# Patient Record
Sex: Female | Born: 1970 | Race: Black or African American | Hispanic: No | Smoking: Never smoker
Health system: Southern US, Community
[De-identification: ages and names within clinical notes are randomized; demographics above are authoritative.]

## PROBLEM LIST (undated history)

## (undated) DIAGNOSIS — K219 Gastro-esophageal reflux disease without esophagitis: Secondary | ICD-10-CM

## (undated) DIAGNOSIS — D0590 Unspecified type of carcinoma in situ of unspecified breast: Secondary | ICD-10-CM

## (undated) DIAGNOSIS — D571 Sickle-cell disease without crisis: Secondary | ICD-10-CM

## (undated) DIAGNOSIS — Z789 Other specified health status: Secondary | ICD-10-CM

## (undated) DIAGNOSIS — E119 Type 2 diabetes mellitus without complications: Secondary | ICD-10-CM

## (undated) DIAGNOSIS — T7840XA Allergy, unspecified, initial encounter: Secondary | ICD-10-CM

## (undated) DIAGNOSIS — R7303 Prediabetes: Secondary | ICD-10-CM

## (undated) DIAGNOSIS — D649 Anemia, unspecified: Secondary | ICD-10-CM

## (undated) HISTORY — PX: NO PAST SURGERIES: SHX2092

## (undated) HISTORY — DX: Allergy, unspecified, initial encounter: T78.40XA

## (undated) HISTORY — DX: Anemia, unspecified: D64.9

## (undated) HISTORY — DX: Gastro-esophageal reflux disease without esophagitis: K21.9

## (undated) HISTORY — DX: Sickle-cell disease without crisis: D57.1

---

## 2011-07-10 ENCOUNTER — Ambulatory Visit: Payer: Self-pay

## 2012-07-15 ENCOUNTER — Ambulatory Visit: Payer: Self-pay

## 2013-10-05 ENCOUNTER — Ambulatory Visit: Payer: Self-pay

## 2015-11-04 ENCOUNTER — Other Ambulatory Visit: Payer: Self-pay | Admitting: Obstetrics and Gynecology

## 2015-11-04 DIAGNOSIS — Z1231 Encounter for screening mammogram for malignant neoplasm of breast: Secondary | ICD-10-CM

## 2015-11-23 ENCOUNTER — Encounter: Payer: Self-pay | Admitting: Radiology

## 2015-11-23 ENCOUNTER — Ambulatory Visit
Admission: RE | Admit: 2015-11-23 | Discharge: 2015-11-23 | Disposition: A | Discharge status: Home / Self Care | Payer: Commercial Managed Care - PPO | Source: Ambulatory Visit | Attending: Obstetrics and Gynecology | Admitting: Obstetrics and Gynecology

## 2015-11-23 DIAGNOSIS — Z1231 Encounter for screening mammogram for malignant neoplasm of breast: Secondary | ICD-10-CM | POA: Insufficient documentation

## 2016-11-09 ENCOUNTER — Other Ambulatory Visit: Payer: Self-pay | Admitting: Internal Medicine

## 2016-11-09 MED ORDER — PREDNISONE 20 MG PO TABS: 20.0000 mg | ORAL_TABLET | Freq: Every day | ORAL | 0 refills | Status: DC

## 2016-11-09 MED ORDER — AMOXICILLIN-POT CLAVULANATE 875-125 MG PO TABS: 1.0000 | ORAL_TABLET | Freq: Two times a day (BID) | ORAL | 0 refills | Status: DC

## 2017-01-24 DIAGNOSIS — J3089 Other allergic rhinitis: Secondary | ICD-10-CM | POA: Insufficient documentation

## 2017-01-24 DIAGNOSIS — D649 Anemia, unspecified: Secondary | ICD-10-CM | POA: Insufficient documentation

## 2017-11-28 ENCOUNTER — Other Ambulatory Visit: Payer: Self-pay | Admitting: Obstetrics and Gynecology

## 2017-11-28 DIAGNOSIS — Z1231 Encounter for screening mammogram for malignant neoplasm of breast: Secondary | ICD-10-CM

## 2017-12-19 ENCOUNTER — Ambulatory Visit
Admission: RE | Admit: 2017-12-19 | Discharge: 2017-12-19 | Disposition: A | Discharge status: Home / Self Care | Payer: Commercial Managed Care - PPO | Source: Ambulatory Visit | Attending: Obstetrics and Gynecology | Admitting: Obstetrics and Gynecology

## 2017-12-19 DIAGNOSIS — Z1231 Encounter for screening mammogram for malignant neoplasm of breast: Secondary | ICD-10-CM | POA: Diagnosis not present

## 2018-09-24 ENCOUNTER — Other Ambulatory Visit: Payer: Self-pay | Admitting: Internal Medicine

## 2018-09-24 MED ORDER — AMOXICILLIN-POT CLAVULANATE 875-125 MG PO TABS: 1.0000 | ORAL_TABLET | Freq: Two times a day (BID) | ORAL | 0 refills | Status: DC

## 2018-10-27 ENCOUNTER — Other Ambulatory Visit: Payer: Self-pay

## 2018-10-27 ENCOUNTER — Ambulatory Visit: Payer: Commercial Managed Care - PPO | Admitting: Internal Medicine

## 2018-10-27 ENCOUNTER — Encounter: Payer: Self-pay | Admitting: Internal Medicine

## 2018-10-27 VITALS — BP 132/80 | HR 72 | Temp 98.4°F | Ht 61.0 in | Wt 252.0 lb

## 2018-10-27 DIAGNOSIS — R030 Elevated blood-pressure reading, without diagnosis of hypertension: Secondary | ICD-10-CM

## 2018-10-27 DIAGNOSIS — J302 Other seasonal allergic rhinitis: Secondary | ICD-10-CM | POA: Diagnosis not present

## 2018-10-27 NOTE — Progress Notes (Signed)
HPI  Pt presents to the clinic today to establish care and for management of the conditions listed below. She is transferring care from [facility].  Seasonal Allergies: Worse in the spring and fall. She takes Xyzal daily with good relief.  Elevated Blood Pressure: Her BP today is 132/80. She reports she gets anxious when she comes to the doctor. Her BP at home usually runs 120/70. She denies headaches, dizziness or visual changes.  Flu: 12/2017 Tetanus: 08/2017 Pap Smear: 10/2015 Mammogram: 12/2017 Vision Screening: as needed Dentist: as needed  No past medical history on file.  Current Outpatient Medications  Medication Sig Dispense Refill  . levocetirizine (XYZAL) 5 MG tablet every evening.     No current facility-administered medications for this visit.     No Known Allergies  Family History  Problem Relation Age of Onset  . Diabetes Father   . Diabetes Paternal Grandmother   . Breast cancer Neg Hx     Social History   Socioeconomic History  . Marital status: Divorced    Spouse name: Not on file  . Number of children: Not on file  . Years of education: Not on file  . Highest education level: Not on file  Occupational History  . Not on file  Social Needs  . Financial resource strain: Not on file  . Food insecurity    Worry: Not on file    Inability: Not on file  . Transportation needs    Medical: Not on file    Non-medical: Not on file  Tobacco Use  . Smoking status: Never Smoker  . Smokeless tobacco: Never Used  Substance and Sexual Activity  . Alcohol use: Yes    Comment: social  . Drug use: Never  . Sexual activity: Not on file  Lifestyle  . Physical activity    Days per week: Not on file    Minutes per session: Not on file  . Stress: Not on file  Relationships  . Social Herbalist on phone: Not on file    Gets together: Not on file    Attends religious service: Not on file    Active member of club or organization: Not on file   Attends meetings of clubs or organizations: Not on file    Relationship status: Not on file  . Intimate partner violence    Fear of current or ex partner: Not on file    Emotionally abused: Not on file    Physically abused: Not on file    Forced sexual activity: Not on file  Other Topics Concern  . Not on file  Social History Narrative  . Not on file    ROS:  Constitutional: Denies fever, malaise, fatigue, headache or abrupt weight changes.  HEENT: Denies eye pain, eye redness, ear pain, ringing in the ears, wax buildup, runny nose, nasal congestion, bloody nose, or sore throat. Respiratory: Denies difficulty breathing, shortness of breath, cough or sputum production.   Cardiovascular: Denies chest pain, chest tightness, palpitations or swelling in the hands or feet.  Gastrointestinal: Denies abdominal pain, bloating, constipation, diarrhea or blood in the stool.  GU: Denies frequency, urgency, pain with urination, blood in urine, odor or discharge. Musculoskeletal: Denies decrease in range of motion, difficulty with gait, muscle pain or joint pain and swelling.  Skin: Denies redness, rashes, lesions or ulcercations.  Neurological: Denies dizziness, difficulty with memory, difficulty with speech or problems with balance and coordination.  Psych: Denies anxiety, depression, SI/HI.  No other specific complaints in a complete review of systems (except as listed in HPI above).  PE:  BP 132/80   Pulse 72   Temp 98.4 F (36.9 C) (Temporal)   Ht 5\' 1"  (1.549 m)   Wt 252 lb (114.3 kg)   LMP 10/22/2018   SpO2 98%   BMI 47.61 kg/m   Wt Readings from Last 3 Encounters:  10/27/18 252 lb (114.3 kg)    General: Appears her stated age, obese, in NAD. HEENT: Head: normal shape and size; Eyes: sclera white, no icterus, conjunctiva pink, PERRLA and EOMs intact; Ears: Tm's gray and intact, normal light reflex;Throat/Mouth: Teeth present, mucosa pink and moist, no lesions or ulcerations  noted.  Cardiovascular: Normal rate and rhythm. Pulmonary/Chest: Normal effort and positive vesicular breath sounds. No respiratory distress. No wheezes, rales or ronchi noted.  Neurological: Alert and oriented.  Psychiatric: Mood and affect normal. Behavior is normal. Judgment and thought content normal.    Assessment and Plan:  Seasonal Allergies:  Continue Xyzal  Elevated Blood Pressure, Not HTN:  Will monitor Discussed DASH diet and exercise for weight loss  Make an appt for her annual exam

## 2018-10-27 NOTE — Patient Instructions (Signed)

## 2018-10-29 ENCOUNTER — Other Ambulatory Visit: Payer: Self-pay

## 2018-10-29 MED ORDER — LEVOCETIRIZINE DIHYDROCHLORIDE 5 MG PO TABS: 5.0000 mg | ORAL_TABLET | Freq: Every evening | ORAL | 1 refills | Status: DC

## 2018-12-09 ENCOUNTER — Encounter: Payer: Commercial Managed Care - PPO | Admitting: Internal Medicine

## 2019-01-09 ENCOUNTER — Encounter: Payer: Self-pay | Admitting: Internal Medicine

## 2019-01-09 ENCOUNTER — Other Ambulatory Visit: Payer: Self-pay

## 2019-01-09 ENCOUNTER — Ambulatory Visit (INDEPENDENT_AMBULATORY_CARE_PROVIDER_SITE_OTHER): Payer: Commercial Managed Care - PPO | Admitting: Internal Medicine

## 2019-01-09 VITALS — BP 130/84 | HR 71 | Temp 98.2°F | Ht 61.0 in | Wt 255.0 lb

## 2019-01-09 DIAGNOSIS — Z0000 Encounter for general adult medical examination without abnormal findings: Secondary | ICD-10-CM | POA: Diagnosis not present

## 2019-01-09 NOTE — Progress Notes (Deleted)
Established Patient Office Visit  Subjective:  Patient ID: Veronica Chandler, female    DOB: 1970-10-01  Age: 48 y.o. MRN: [id number]    HPI Italia Yui Mulvaney presents for her annual physical. She is due for followup of chronic conditions.   Seasonal Allergies: Worse in the spring and fall. She takes Xyzal daily with good relief. No new issues reported  Elevated Blood Pressure: Her BP today is 130/84. She reports she gets anxious when she comes to the doctor. Her BP at home usually runs 120/70. She denies headaches, dizziness or visual changes.  Flu: 12/2018 Tetanus: 08/2017 Pap Smear: 11/2017 Mammogram: 12/2017 Vision Screening: as needed Dentist: as needed   Diet: Patient eats meats. She consumes fruits and veggies daily. She also eats fried foods. She drinks mostly water, coffee and stays away from carbonated drinks.  Exercise:none  No past medical history on file.  No past surgical history on file.  Family History  Problem Relation Age of Onset  . Diabetes Father   . Diabetes Paternal Grandmother   . Breast cancer Neg Hx     Social History   Socioeconomic History  . Marital status: Divorced    Spouse name: Not on file  . Number of children: Not on file  . Years of education: Not on file  . Highest education level: Not on file  Occupational History  . Not on file  Social Needs  . Financial resource strain: Not on file  . Food insecurity    Worry: Not on file    Inability: Not on file  . Transportation needs    Medical: Not on file    Non-medical: Not on file  Tobacco Use  . Smoking status: Never Smoker  . Smokeless tobacco: Never Used  Substance and Sexual Activity  . Alcohol use: Yes    Comment: social  . Drug use: Never  . Sexual activity: Not on file  Lifestyle  . Physical activity    Days per week: Not on file    Minutes per session: Not on file  . Stress: Not on file  Relationships  . Social Herbalist on phone: Not  on file    Gets together: Not on file    Attends religious service: Not on file    Active member of club or organization: Not on file    Attends meetings of clubs or organizations: Not on file    Relationship status: Not on file  . Intimate partner violence    Fear of current or ex partner: Not on file    Emotionally abused: Not on file    Physically abused: Not on file    Forced sexual activity: Not on file  Other Topics Concern  . Not on file  Social History Narrative  . Not on file    Outpatient Medications Prior to Visit  Medication Sig Dispense Refill  . levocetirizine (XYZAL) 5 MG tablet Take 1 tablet (5 mg total) by mouth every evening. 90 tablet 1   No facility-administered medications prior to visit.     No Known Allergies  ROS Review of Systems   No past medical history on file.  Current Outpatient Medications  Medication Sig Dispense Refill  . levocetirizine (XYZAL) 5 MG tablet Take 1 tablet (5 mg total) by mouth every evening. 90 tablet 1   No current facility-administered medications for this visit.     No Known Allergies  Family History  Problem Relation Age of  Onset  . Diabetes Father   . Diabetes Paternal Grandmother   . Breast cancer Neg Hx     Social History   Socioeconomic History  . Marital status: Divorced    Spouse name: Not on file  . Number of children: Not on file  . Years of education: Not on file  . Highest education level: Not on file  Occupational History  . Not on file  Social Needs  . Financial resource strain: Not on file  . Food insecurity    Worry: Not on file    Inability: Not on file  . Transportation needs    Medical: Not on file    Non-medical: Not on file  Tobacco Use  . Smoking status: Never Smoker  . Smokeless tobacco: Never Used  Substance and Sexual Activity  . Alcohol use: Yes    Comment: social  . Drug use: Never  . Sexual activity: Not on file  Lifestyle  . Physical activity    Days per week: Not  on file    Minutes per session: Not on file  . Stress: Not on file  Relationships  . Social Herbalist on phone: Not on file    Gets together: Not on file    Attends religious service: Not on file    Active member of club or organization: Not on file    Attends meetings of clubs or organizations: Not on file    Relationship status: Not on file  . Intimate partner violence    Fear of current or ex partner: Not on file    Emotionally abused: Not on file    Physically abused: Not on file    Forced sexual activity: Not on file  Other Topics Concern  . Not on file  Social History Narrative  . Not on file     Constitutional: Denies fever, malaise, fatigue, headache or abrupt weight changes.  HEENT: Denies eye pain, eye redness, ear pain, ringing in the ears, wax buildup, runny nose, nasal congestion, bloody nose, or sore throat. Respiratory: Denies difficulty breathing, shortness of breath, cough or sputum production.   Cardiovascular: Denies chest pain, chest tightness, palpitations or swelling in the hands or feet.  Gastrointestinal: Denies abdominal pain, bloating, constipation, diarrhea or blood in the stool.  GU: Denies urgency, frequency, pain with urination, burning sensation, blood in urine, odor or discharge. Musculoskeletal: Denies decrease in range of motion, difficulty with gait, muscle pain or joint pain and swelling.  Skin: Denies redness, rashes, lesions or ulcercations.  Neurological: Denies dizziness, difficulty with memory, difficulty with speech or problems with balance and coordination.  Psych: Denies anxiety, depression, SI/HI.  No other specific complaints in a complete review of systems (except as listed in HPI above).    Objective:    Physical Exam  There were no vitals taken for this visit. Wt Readings from Last 3 Encounters:  10/27/18 114.3 kg     Health Maintenance Due  Topic Date Due  . HIV Screening  11/29/1984  . TETANUS/TDAP   11/29/1988  . INFLUENZA VACCINE  10/04/2018    There are no preventive care reminders to display for this patient.  No results found for: TSH No results found for: WBC, HGB, HCT, MCV, PLT No results found for: NA, K, CHLORIDE, CO2, GLUCOSE, BUN, CREATININE, BILITOT, ALKPHOS, AST, ALT, PROT, ALBUMIN, CALCIUM, ANIONGAP, EGFR, GFR No results found for: CHOL No results found for: HDL No results found for: LDLCALC No results found  for: TRIG No results found for: CHOLHDL No results found for: HGBA1C    No past medical history on file.  Current Outpatient Medications  Medication Sig Dispense Refill  . levocetirizine (XYZAL) 5 MG tablet Take 1 tablet (5 mg total) by mouth every evening. 90 tablet 1   No current facility-administered medications for this visit.     No Known Allergies  Family History  Problem Relation Age of Onset  . Diabetes Father   . Diabetes Paternal Grandmother   . Breast cancer Neg Hx     Social History   Socioeconomic History  . Marital status: Divorced    Spouse name: Not on file  . Number of children: Not on file  . Years of education: Not on file  . Highest education level: Not on file  Occupational History  . Not on file  Social Needs  . Financial resource strain: Not on file  . Food insecurity    Worry: Not on file    Inability: Not on file  . Transportation needs    Medical: Not on file    Non-medical: Not on file  Tobacco Use  . Smoking status: Never Smoker  . Smokeless tobacco: Never Used  Substance and Sexual Activity  . Alcohol use: Yes    Comment: social  . Drug use: Never  . Sexual activity: Not on file  Lifestyle  . Physical activity    Days per week: Not on file    Minutes per session: Not on file  . Stress: Not on file  Relationships  . Social Herbalist on phone: Not on file    Gets together: Not on file    Attends religious service: Not on file    Active member of club or organization: Not on file     Attends meetings of clubs or organizations: Not on file    Relationship status: Not on file  . Intimate partner violence    Fear of current or ex partner: Not on file    Emotionally abused: Not on file    Physically abused: Not on file    Forced sexual activity: Not on file  Other Topics Concern  . Not on file  Social History Narrative  . Not on file     BP 130/84 (BP Location: Right Arm, Patient Position: Sitting, Cuff Size: Large)   Pulse 71   Temp 98.2 F (36.8 C) (Temporal)   Ht '5\' 1"'  (1.549 m)   Wt 115.7 kg   LMP 12/13/2018   SpO2 100%   BMI 48.18 kg/m  Wt Readings from Last 3 Encounters:  01/09/19 115.7 kg  10/27/18 114.3 kg    General: Appears their stated age, well developed, obese Skin: Warm, dry and intact. No rashes, lesions or ulcerations noted. HEENT: Head: normal shape and size; Eyes: sclera white, no icterus, conjunctiva pink and EOMs intact Neck:  Neck supple, trachea midline. No masses, lumps or thyromegaly present.  Cardiovascular: Normal rate and rhythm. S1,S2 noted.  No murmur, rubs or gallops noted. No JVD or BLE edema. No carotid bruits noted. Pulmonary/Chest: Normal effort and positive vesicular breath sounds. No respiratory distress. No wheezes, rales or ronchi noted.  Abdomen: Soft and nontender. Normal bowel sounds. No distention or masses noted. Liver, spleen and kidneys non palpable. Musculoskeletal: Normal range of motion.5+ strength in bilateral upper and lower extremities. No signs of joint swelling. No difficulty with gait.  Neurological: Alert and oriented. Cranial nerves II-XII grossly intact.  Coordination normal.  Psychiatric: Mood and affect normal. Behavior is normal. Judgment and thought content normal.   EKG:  BMET No results found for: NA, K, CL, CO2, GLUCOSE, BUN, CREATININE, CALCIUM, GFRNONAA, GFRAA  Lipid Panel  No results found for: CHOL, TRIG, HDL, CHOLHDL, VLDL, LDLCALC  CBC No results found for: WBC, RBC, HGB, HCT, PLT,  MCV, MCH, MCHC, RDW, LYMPHSABS, MONOABS, EOSABS, BASOSABS  Hgb A1C No results found for: HGBA1C   .    Assessment & Plan:  Encounter for Preventive Healthcare Maintenance:  Seasonal Allergies: Continue Xyzal daily   Elevated Blood Pressure, Not HTN: Will monitor  Flu: UTD Tetanus: UTD Pap Smear: UTD Mammogram: 12/2017 Discussed DASH diet and exercise for weight loss Problem List Items Addressed This Visit    None      No orders of the defined types were placed in this encounter.   Follow-up: No follow-ups on file.    Leward Quan, Student-PA

## 2019-01-09 NOTE — Progress Notes (Signed)
Subjective:    Patient ID: Veronica Chandler, female    DOB: 01/24/1971, 48 y.o.   MRN: [id number]  HPI  Pt presents to the clinic today for her annual exam.  Flu: 12/2018 Tetanus: 08/2017 Pap Smear: 10/2015 Mammogram: 12/2017 Vision Screening: as needed Dentist: as needed  Diet: She does eat meat. She consumes fruits and veggies daily. She tries to avoid fried foods. She drinks mostly water. Exercise: None  Review of Systems      No past medical history on file.  Current Outpatient Medications  Medication Sig Dispense Refill  . levocetirizine (XYZAL) 5 MG tablet Take 1 tablet (5 mg total) by mouth every evening. 90 tablet 1   No current facility-administered medications for this visit.     No Known Allergies  Family History  Problem Relation Age of Onset  . Diabetes Father   . Diabetes Paternal Grandmother   . Breast cancer Neg Hx     Social History   Socioeconomic History  . Marital status: Divorced    Spouse name: Not on file  . Number of children: Not on file  . Years of education: Not on file  . Highest education level: Not on file  Occupational History  . Not on file  Social Needs  . Financial resource strain: Not on file  . Food insecurity    Worry: Not on file    Inability: Not on file  . Transportation needs    Medical: Not on file    Non-medical: Not on file  Tobacco Use  . Smoking status: Never Smoker  . Smokeless tobacco: Never Used  Substance and Sexual Activity  . Alcohol use: Yes    Comment: social  . Drug use: Never  . Sexual activity: Not on file  Lifestyle  . Physical activity    Days per week: Not on file    Minutes per session: Not on file  . Stress: Not on file  Relationships  . Social Herbalist on phone: Not on file    Gets together: Not on file    Attends religious service: Not on file    Active member of club or organization: Not on file    Attends meetings of clubs or organizations: Not on file   Relationship status: Not on file  . Intimate partner violence    Fear of current or ex partner: Not on file    Emotionally abused: Not on file    Physically abused: Not on file    Forced sexual activity: Not on file  Other Topics Concern  . Not on file  Social History Narrative  . Not on file     Constitutional: Denies fever, malaise, fatigue, headache or abrupt weight changes.  HEENT: Denies eye pain, eye redness, ear pain, ringing in the ears, wax buildup, runny nose, nasal congestion, bloody nose, or sore throat. Respiratory: Denies difficulty breathing, shortness of breath, cough or sputum production.   Cardiovascular: Denies chest pain, chest tightness, palpitations or swelling in the hands or feet.  Gastrointestinal: Denies abdominal pain, bloating, constipation, diarrhea or blood in the stool.  GU: Denies urgency, frequency, pain with urination, burning sensation, blood in urine, odor or discharge. Musculoskeletal: Denies decrease in range of motion, difficulty with gait, muscle pain or joint pain and swelling.  Skin: Denies redness, rashes, lesions or ulcercations.  Neurological: Denies dizziness, difficulty with memory, difficulty with speech or problems with balance and coordination.  Psych: Denies anxiety, depression, SI/HI.  No other specific complaints in a complete review of systems (except as listed in HPI above).  Objective:   Physical Exam   BP 130/84 (BP Location: Right Arm, Patient Position: Sitting, Cuff Size: Large)   Pulse 71   Temp 98.2 F (36.8 C) (Temporal)   Ht 5\' 1"  (1.549 m)   Wt 255 lb (115.7 kg)   LMP 12/13/2018   SpO2 100%   BMI 48.18 kg/m   Wt Readings from Last 3 Encounters:  10/27/18 252 lb (114.3 kg)    General: Appears her stated age, obese, in NAD. Skin: Warm, dry and intact. No rashes noted. HEENT: Head: normal shape and size; Eyes: sclera white, no icterus, conjunctiva pink, PERRLA and EOMs intact; Ears: Tm's gray and intact,  normal light reflex;  Neck:  Neck supple, trachea midline. No masses, lumps or thyromegaly present.  Cardiovascular: Normal rate and rhythm. S1,S2 noted.  No murmur, rubs or gallops noted. No JVD or BLE edema.  Pulmonary/Chest: Normal effort and positive vesicular breath sounds. No respiratory distress. No wheezes, rales or ronchi noted.  Abdomen: Soft and nontender. Normal bowel sounds. No distention or masses noted. Liver, spleen and kidneys non palpable. Musculoskeletal: Strength 5/5 BUE/BLE. No difficulty with gait.  Neurological: Alert and oriented. Cranial nerves II-XII grossly intact. Coordination normal.  Psychiatric: Mood and affect normal. Behavior is normal. Judgment and thought content normal.       Assessment & Plan:   Preventative Health Maintenance:  Flu shot UTD Tetanus UTD Pap smear UTD She will call to schedule her mammogram Encouraged her to consume a balanced diet and exercise regimen Advised her to see an eye doctor and dentist annually Will check CBC, CMET, Lipid, A1C and Vit D today  RTC in 1 year, sooner if needed [name], NP

## 2019-01-09 NOTE — Patient Instructions (Signed)
Health Maintenance, Female Adopting a healthy lifestyle and getting preventive care are important in promoting health and wellness. Ask your health care provider about:  The right schedule for you to have regular tests and exams.  Things you can do on your own to prevent diseases and keep yourself healthy. What should I know about diet, weight, and exercise? Eat a healthy diet   Eat a diet that includes plenty of vegetables, fruits, low-fat dairy products, and lean protein.  Do not eat a lot of foods that are high in solid fats, added sugars, or sodium. Maintain a healthy weight Body mass index (BMI) is used to identify weight problems. It estimates body fat based on height and weight. Your health care provider can help determine your BMI and help you achieve or maintain a healthy weight. Get regular exercise Get regular exercise. This is one of the most important things you can do for your health. Most adults should:  Exercise for at least 150 minutes each week. The exercise should increase your heart rate and make you sweat (moderate-intensity exercise).  Do strengthening exercises at least twice a week. This is in addition to the moderate-intensity exercise.  Spend less time sitting. Even light physical activity can be beneficial. Watch cholesterol and blood lipids Have your blood tested for lipids and cholesterol at 49 years of age, then have this test every 5 years. Have your cholesterol levels checked more often if:  Your lipid or cholesterol levels are high.  You are older than 49 years of age.  You are at high risk for heart disease. What should I know about cancer screening? Depending on your health history and family history, you may need to have cancer screening at various ages. This may include screening for:  Breast cancer.  Cervical cancer.  Colorectal cancer.  Skin cancer.  Lung cancer. What should I know about heart disease, diabetes, and high blood  pressure? Blood pressure and heart disease  High blood pressure causes heart disease and increases the risk of stroke. This is more likely to develop in people who have high blood pressure readings, are of African descent, or are overweight.  Have your blood pressure checked: ? Every 3-5 years if you are 18-39 years of age. ? Every year if you are 40 years old or older. Diabetes Have regular diabetes screenings. This checks your fasting blood sugar level. Have the screening done:  Once every three years after age 40 if you are at a normal weight and have a low risk for diabetes.  More often and at a younger age if you are overweight or have a high risk for diabetes. What should I know about preventing infection? Hepatitis B If you have a higher risk for hepatitis B, you should be screened for this virus. Talk with your health care provider to find out if you are at risk for hepatitis B infection. Hepatitis C Testing is recommended for:  Everyone born from 1945 through 1965.  Anyone with known risk factors for hepatitis C. Sexually transmitted infections (STIs)  Get screened for STIs, including gonorrhea and chlamydia, if: ? You are sexually active and are younger than 49 years of age. ? You are older than 49 years of age and your health care provider tells you that you are at risk for this type of infection. ? Your sexual activity has changed since you were last screened, and you are at increased risk for chlamydia or gonorrhea. Ask your health care provider if   you are at risk.  Ask your health care provider about whether you are at high risk for HIV. Your health care provider may recommend a prescription medicine to help prevent HIV infection. If you choose to take medicine to prevent HIV, you should first get tested for HIV. You should then be tested every 3 months for as long as you are taking the medicine. Pregnancy  If you are about to stop having your period (premenopausal) and  you may become pregnant, seek counseling before you get pregnant.  Take 400 to 800 micrograms (mcg) of folic acid every day if you become pregnant.  Ask for birth control (contraception) if you want to prevent pregnancy. Osteoporosis and menopause Osteoporosis is a disease in which the bones lose minerals and strength with aging. This can result in bone fractures. If you are 65 years old or older, or if you are at risk for osteoporosis and fractures, ask your health care provider if you should:  Be screened for bone loss.  Take a calcium or vitamin D supplement to lower your risk of fractures.  Be given hormone replacement therapy (HRT) to treat symptoms of menopause. Follow these instructions at home: Lifestyle  Do not use any products that contain nicotine or tobacco, such as cigarettes, e-cigarettes, and chewing tobacco. If you need help quitting, ask your health care provider.  Do not use street drugs.  Do not share needles.  Ask your health care provider for help if you need support or information about quitting drugs. Alcohol use  Do not drink alcohol if: ? Your health care provider tells you not to drink. ? You are pregnant, may be pregnant, or are planning to become pregnant.  If you drink alcohol: ? Limit how much you use to 0-1 drink a day. ? Limit intake if you are breastfeeding.  Be aware of how much alcohol is in your drink. In the [place] one drink equals one 12 oz bottle of beer (355 mL), one 5 oz glass of wine (148 mL), or one 1 oz glass of hard liquor (44 mL). General instructions  Schedule regular health, dental, and eye exams.  Stay current with your vaccines.  Tell your health care provider if: ? You often feel depressed. ? You have ever been abused or do not feel safe at home. Summary  Adopting a healthy lifestyle and getting preventive care are important in promoting health and wellness.  Follow your health care provider's instructions about healthy  diet, exercising, and getting tested or screened for diseases.  Follow your health care provider's instructions on monitoring your cholesterol and blood pressure. This information is not intended to replace advice given to you by your health care provider. Make sure you discuss any questions you have with your health care provider. Document Released: 09/04/2010 Document Revised: 02/12/2018 Document Reviewed: 02/12/2018 Elsevier Patient Education  2020 Else[employer] Inc.  

## 2019-01-10 LAB — COMPREHENSIVE METABOLIC PANEL
AG Ratio: 1.3 (calc) (ref 1.0–2.5)
ALT: 16 U/L (ref 6–29)
AST: 15 U/L (ref 10–35)
Albumin: 4.2 g/dL (ref 3.6–5.1)
Alkaline phosphatase (APISO): 65 U/L (ref 31–125)
BUN: 15 mg/dL (ref 7–25)
CO2: 26 mmol/L (ref 20–32)
Calcium: 9.8 mg/dL (ref 8.6–10.2)
Chloride: 103 mmol/L (ref 98–110)
Creat: 0.81 mg/dL (ref 0.50–1.10)
Globulin: 3.3 g/dL (calc) (ref 1.9–3.7)
Glucose, Bld: 93 mg/dL (ref 65–99)
Potassium: 4.2 mmol/L (ref 3.5–5.3)
Sodium: 138 mmol/L (ref 135–146)
Total Bilirubin: 0.3 mg/dL (ref 0.2–1.2)
Total Protein: 7.5 g/dL (ref 6.1–8.1)

## 2019-01-10 LAB — CBC
HCT: 35.6 % (ref 35.0–45.0)
Hemoglobin: 11.9 g/dL (ref 11.7–15.5)
MCH: 29.3 pg (ref 27.0–33.0)
MCHC: 33.4 g/dL (ref 32.0–36.0)
MCV: 87.7 fL (ref 80.0–100.0)
MPV: 9.5 fL (ref 7.5–12.5)
Platelets: 299 10*3/uL (ref 140–400)
RBC: 4.06 10*6/uL (ref 3.80–5.10)
RDW: 13.7 % (ref 11.0–15.0)
WBC: 6.3 10*3/uL (ref 3.8–10.8)

## 2019-01-10 LAB — LIPID PANEL
Cholesterol: 151 mg/dL (ref <200)
HDL: 53 mg/dL (ref >=50)
LDL Cholesterol (Calc): 78 mg/dL (calc)
Non-HDL Cholesterol (Calc): 98 mg/dL (calc) (ref <130)
Total CHOL/HDL Ratio: 2.8 (calc) (ref <5.0)
Triglycerides: 115 mg/dL (ref <150)

## 2019-01-10 LAB — HEMOGLOBIN A1C
Hgb A1c MFr Bld: 6.1 % of total Hgb — ABNORMAL HIGH (ref <5.7)
Mean Plasma Glucose: 128 (calc)
eAG (mmol/L): 7.1 (calc)

## 2019-01-10 LAB — VITAMIN D 25 HYDROXY (VIT D DEFICIENCY, FRACTURES): Vit D, 25-Hydroxy: 28 ng/mL — ABNORMAL LOW (ref 30–100)

## 2019-02-05 ENCOUNTER — Ambulatory Visit: Payer: Commercial Managed Care - PPO | Admitting: Internal Medicine

## 2019-02-05 NOTE — Progress Notes (Deleted)
Subjective:    Patient ID: Veronica Chandler, female    DOB: 06-30-1970, 48 y.o.   MRN: [id number]  HPI  Pt presents to the clinic today for her pap smear. Her last pap smear was 08/2015- normal.  Review of Systems      No past medical history on file.  Current Outpatient Medications  Medication Sig Dispense Refill  . levocetirizine (XYZAL) 5 MG tablet Take 1 tablet (5 mg total) by mouth every evening. 90 tablet 1   No current facility-administered medications for this visit.     No Known Allergies  Family History  Problem Relation Age of Onset  . Diabetes Father   . Diabetes Paternal Grandmother   . Breast cancer Neg Hx     Social History   Socioeconomic History  . Marital status: Divorced    Spouse name: Not on file  . Number of children: Not on file  . Years of education: Not on file  . Highest education level: Not on file  Occupational History  . Not on file  Social Needs  . Financial resource strain: Not on file  . Food insecurity    Worry: Not on file    Inability: Not on file  . Transportation needs    Medical: Not on file    Non-medical: Not on file  Tobacco Use  . Smoking status: Never Smoker  . Smokeless tobacco: Never Used  Substance and Sexual Activity  . Alcohol use: Yes    Comment: social  . Drug use: Never  . Sexual activity: Not on file  Lifestyle  . Physical activity    Days per week: Not on file    Minutes per session: Not on file  . Stress: Not on file  Relationships  . Social Herbalist on phone: Not on file    Gets together: Not on file    Attends religious service: Not on file    Active member of club or organization: Not on file    Attends meetings of clubs or organizations: Not on file    Relationship status: Not on file  . Intimate partner violence    Fear of current or ex partner: Not on file    Emotionally abused: Not on file    Physically abused: Not on file    Forced sexual activity: Not on file   Other Topics Concern  . Not on file  Social History Narrative  . Not on file     Constitutional: Denies fever, malaise, fatigue, headache or abrupt weight changes.  HEENT: Denies eye pain, eye redness, ear pain, ringing in the ears, wax buildup, runny nose, nasal congestion, bloody nose, or sore throat. Respiratory: Denies difficulty breathing, shortness of breath, cough or sputum production.   Cardiovascular: Denies chest pain, chest tightness, palpitations or swelling in the hands or feet.  Gastrointestinal: Denies abdominal pain, bloating, constipation, diarrhea or blood in the stool.  GU: Denies urgency, frequency, pain with urination, burning sensation, blood in urine, odor or discharge. Musculoskeletal: Denies decrease in range of motion, difficulty with gait, muscle pain or joint pain and swelling.  Skin: Denies redness, rashes, lesions or ulcercations.  Neurological: Denies dizziness, difficulty with memory, difficulty with speech or problems with balance and coordination.  Psych: Denies anxiety, depression, SI/HI.  No other specific complaints in a complete review of systems (except as listed in HPI above).  Objective:   Physical Exam   There were no vitals taken for this  visit. Wt Readings from Last 3 Encounters:  01/09/19 255 lb (115.7 kg)  10/27/18 252 lb (114.3 kg)    General: Appears their stated age, well developed, well nourished in NAD. Skin: Warm, dry and intact. No rashes, lesions or ulcerations noted. HEENT: Head: normal shape and size; Eyes: sclera white, no icterus, conjunctiva pink, PERRLA and EOMs intact; Ears: Tm's gray and intact, normal light reflex; Nose: mucosa pink and moist, septum midline; Throat/Mouth: Teeth present, mucosa pink and moist, no exudate, lesions or ulcerations noted.  Neck:  Neck supple, trachea midline. No masses, lumps or thyromegaly present.  Cardiovascular: Normal rate and rhythm. S1,S2 noted.  No murmur, rubs or gallops noted. No  JVD or BLE edema. No carotid bruits noted. Pulmonary/Chest: Normal effort and positive vesicular breath sounds. No respiratory distress. No wheezes, rales or ronchi noted.  Abdomen: Soft and nontender. Normal bowel sounds. No distention or masses noted. Liver, spleen and kidneys non palpable. Musculoskeletal: Normal range of motion. No signs of joint swelling. No difficulty with gait.  Neurological: Alert and oriented. Cranial nerves II-XII grossly intact. Coordination normal.  Psychiatric: Mood and affect normal. Behavior is normal. Judgment and thought content normal.   EKG:  BMET    Component Value Date/Time   NA 138 01/09/2019 1505   K 4.2 01/09/2019 1505   CL 103 01/09/2019 1505   CO2 26 01/09/2019 1505   GLUCOSE 93 01/09/2019 1505   BUN 15 01/09/2019 1505   CREATININE 0.81 01/09/2019 1505   CALCIUM 9.8 01/09/2019 1505    Lipid Panel     Component Value Date/Time   CHOL 151 01/09/2019 1505   TRIG 115 01/09/2019 1505   HDL 53 01/09/2019 1505   CHOLHDL 2.8 01/09/2019 1505   LDLCALC 78 01/09/2019 1505    CBC    Component Value Date/Time   WBC 6.3 01/09/2019 1505   RBC 4.06 01/09/2019 1505   HGB 11.9 01/09/2019 1505   HCT 35.6 01/09/2019 1505   PLT 299 01/09/2019 1505   MCV 87.7 01/09/2019 1505   MCH 29.3 01/09/2019 1505   MCHC 33.4 01/09/2019 1505   RDW 13.7 01/09/2019 1505    Hgb A1C Lab Results  Component Value Date   HGBA1C 6.1 (H) 01/09/2019           Assessment & Plan:

## 2019-04-26 ENCOUNTER — Other Ambulatory Visit: Payer: Self-pay | Admitting: Internal Medicine

## 2019-10-25 ENCOUNTER — Other Ambulatory Visit: Payer: Self-pay | Admitting: Internal Medicine

## 2019-12-16 ENCOUNTER — Telehealth: Payer: Self-pay | Admitting: Internal Medicine

## 2019-12-16 NOTE — Telephone Encounter (Signed)
Called and left message for patient to call and reschedule 01/11/20 appt.

## 2020-01-11 ENCOUNTER — Encounter: Payer: Commercial Managed Care - PPO | Admitting: Internal Medicine

## 2020-02-23 ENCOUNTER — Encounter: Payer: Commercial Managed Care - PPO | Admitting: Internal Medicine

## 2020-04-18 ENCOUNTER — Encounter: Payer: Commercial Managed Care - PPO | Admitting: Internal Medicine

## 2020-04-24 ENCOUNTER — Other Ambulatory Visit: Payer: Self-pay | Admitting: Internal Medicine

## 2020-05-05 ENCOUNTER — Other Ambulatory Visit: Payer: Self-pay

## 2020-05-05 ENCOUNTER — Encounter: Payer: Self-pay | Admitting: Internal Medicine

## 2020-05-05 ENCOUNTER — Ambulatory Visit (INDEPENDENT_AMBULATORY_CARE_PROVIDER_SITE_OTHER): Payer: Commercial Managed Care - PPO | Admitting: Internal Medicine

## 2020-05-05 VITALS — BP 134/86 | HR 84 | Temp 98.1°F | Ht 61.0 in | Wt 271.0 lb

## 2020-05-05 DIAGNOSIS — Z1159 Encounter for screening for other viral diseases: Secondary | ICD-10-CM

## 2020-05-05 DIAGNOSIS — Z1211 Encounter for screening for malignant neoplasm of colon: Secondary | ICD-10-CM | POA: Diagnosis not present

## 2020-05-05 DIAGNOSIS — Z1231 Encounter for screening mammogram for malignant neoplasm of breast: Secondary | ICD-10-CM

## 2020-05-05 DIAGNOSIS — Z0000 Encounter for general adult medical examination without abnormal findings: Secondary | ICD-10-CM

## 2020-05-05 DIAGNOSIS — Z114 Encounter for screening for human immunodeficiency virus [HIV]: Secondary | ICD-10-CM | POA: Diagnosis not present

## 2020-05-05 NOTE — Progress Notes (Signed)
Subjective:    Patient ID: Veronica Chandler, female    DOB: 03-10-1970, 50 y.o.   MRN: [id number]  HPI  Pt presents to the clinic today for her annual exam.   Flu: 12/2019 Tetanus: 08/2017 Covid: Moderna x 3 Pap Smear: 11/2017 Mammogram: 12/2017 Colon Screening: never Vision Screening: as needed Dentist: as needed  Diet: She does eat meat. She consumes some fruits and veggies. She does eat some fried foods. She drinks mostly water, soda. Exercise: None  Review of Systems  No past medical history on file.  Current Outpatient Medications  Medication Sig Dispense Refill  . levocetirizine (XYZAL) 5 MG tablet TAKE 1 TABLET BY MOUTH EVERY DAY IN THE EVENING 30 tablet 1   No current facility-administered medications for this visit.    No Known Allergies  Family History  Problem Relation Age of Onset  . Diabetes Father   . Diabetes Paternal Grandmother   . Breast cancer Neg Hx     Social History   Socioeconomic History  . Marital status: Divorced    Spouse name: Not on file  . Number of children: Not on file  . Years of education: Not on file  . Highest education level: Not on file  Occupational History  . Not on file  Tobacco Use  . Smoking status: Never Smoker  . Smokeless tobacco: Never Used  Substance and Sexual Activity  . Alcohol use: Yes    Comment: social  . Drug use: Never  . Sexual activity: Not on file  Other Topics Concern  . Not on file  Social History Narrative  . Not on file   Social Determinants of Health   Financial Resource Strain: Not on file  Food Insecurity: Not on file  Transportation Needs: Not on file  Physical Activity: Not on file  Stress: Not on file  Social Connections: Not on file  Intimate Partner Violence: Not on file     Constitutional: Denies fever, malaise, fatigue, headache or abrupt weight changes.  HEENT: Denies eye pain, eye redness, ear pain, ringing in the ears, wax buildup, runny nose, nasal  congestion, bloody nose, or sore throat. Respiratory: Denies difficulty breathing, shortness of breath, cough or sputum production.   Cardiovascular: Denies chest pain, chest tightness, palpitations or swelling in the hands or feet.  Gastrointestinal: Denies abdominal pain, bloating, constipation, diarrhea or blood in the stool.  GU: Denies urgency, frequency, pain with urination, burning sensation, blood in urine, odor or discharge. Musculoskeletal: Denies decrease in range of motion, difficulty with gait, muscle pain or joint pain and swelling.  Skin: Denies redness, rashes, lesions or ulcercations.  Neurological: Denies dizziness, difficulty with memory, difficulty with speech or problems with balance and coordination.  Psych: Denies anxiety, depression, SI/HI.  No other specific complaints in a complete review of systems (except as listed in HPI above).     Objective:   Physical Exam   BP 134/86   Pulse 84   Temp 98.1 F (36.7 C) (Temporal)   Ht 5\' 1"  (1.549 m)   Wt 271 lb (122.9 kg)   SpO2 98%   BMI 51.21 kg/m   Wt Readings from Last 3 Encounters:  01/09/19 255 lb (115.7 kg)  10/27/18 252 lb (114.3 kg)    General: Appears her stated age, obese, in NAD. Skin: Warm, dry and intact. No rashes noted. HEENT: Head: normal shape and size; Eyes: sclera white, no icterus, conjunctiva pink, PERRLA and EOMs intact; Neck:  Neck supple, trachea midline.  No masses, lumps or thyromegaly present.  Cardiovascular: Normal rate and rhythm. S1,S2 noted.  No murmur, rubs or gallops noted. No JVD or BLE edema. No carotid bruits noted. Pulmonary/Chest: Normal effort and positive vesicular breath sounds. No respiratory distress. No wheezes, rales or ronchi noted.  Abdomen: Soft and nontender. Normal bowel sounds. No distention or masses noted. Liver, spleen and kidneys non palpable. Musculoskeletal: Strength 5/5 BUE/BLE. No difficulty with gait.  Neurological: Alert and oriented. Cranial nerves  II-XII grossly intact. Coordination normal.  Psychiatric: Mood and affect normal. Behavior is normal. Judgment and thought content normal.   BMET    Component Value Date/Time   NA 138 01/09/2019 1505   K 4.2 01/09/2019 1505   CL 103 01/09/2019 1505   CO2 26 01/09/2019 1505   GLUCOSE 93 01/09/2019 1505   BUN 15 01/09/2019 1505   CREATININE 0.81 01/09/2019 1505   CALCIUM 9.8 01/09/2019 1505    Lipid Panel     Component Value Date/Time   CHOL 151 01/09/2019 1505   TRIG 115 01/09/2019 1505   HDL 53 01/09/2019 1505   CHOLHDL 2.8 01/09/2019 1505   LDLCALC 78 01/09/2019 1505    CBC    Component Value Date/Time   WBC 6.3 01/09/2019 1505   RBC 4.06 01/09/2019 1505   HGB 11.9 01/09/2019 1505   HCT 35.6 01/09/2019 1505   PLT 299 01/09/2019 1505   MCV 87.7 01/09/2019 1505   MCH 29.3 01/09/2019 1505   MCHC 33.4 01/09/2019 1505   RDW 13.7 01/09/2019 1505    Hgb A1C Lab Results  Component Value Date   HGBA1C 6.1 (H) 01/09/2019           Assessment & Plan:   Preventative Health Maintenance:  Flu shot UTD Tetanus UTD Covid UTD Pap smear UTD Mammogram ordered, she will call to schedule Referral to GI for screening colonoscopy Encouraged her to consume a balanced diet and exercise regimen Advised her to see an eye doctor and dentist annually Will check CBC, CMET, Lipid, A1C, HIV and Hep C today  RTC in 1 year, sooner if needed   [name], NP This visit occurred during the SARS-CoV-2 public health emergency.  Safety protocols were in place, including screening questions prior to the visit, additional usage of staff PPE, and extensive cleaning of exam room while observing appropriate contact time as indicated for disinfecting solutions.

## 2020-05-05 NOTE — Patient Instructions (Signed)
Health Maintenance, Female Adopting a healthy lifestyle and getting preventive care are important in promoting health and wellness. Ask your health care provider about:  The right schedule for you to have regular tests and exams.  Things you can do on your own to prevent diseases and keep yourself healthy. What should I know about diet, weight, and exercise? Eat a healthy diet  Eat a diet that includes plenty of vegetables, fruits, low-fat dairy products, and lean protein.  Do not eat a lot of foods that are high in solid fats, added sugars, or sodium.   Maintain a healthy weight Body mass index (BMI) is used to identify weight problems. It estimates body fat based on height and weight. Your health care provider can help determine your BMI and help you achieve or maintain a healthy weight. Get regular exercise Get regular exercise. This is one of the most important things you can do for your health. Most adults should:  Exercise for at least 150 minutes each week. The exercise should increase your heart rate and make you sweat (moderate-intensity exercise).  Do strengthening exercises at least twice a week. This is in addition to the moderate-intensity exercise.  Spend less time sitting. Even light physical activity can be beneficial. Watch cholesterol and blood lipids Have your blood tested for lipids and cholesterol at 51 years of age, then have this test every 5 years. Have your cholesterol levels checked more often if:  Your lipid or cholesterol levels are high.  You are older than 51 years of age.  You are at high risk for heart disease. What should I know about cancer screening? Depending on your health history and family history, you may need to have cancer screening at various ages. This may include screening for:  Breast cancer.  Cervical cancer.  Colorectal cancer.  Skin cancer.  Lung cancer. What should I know about heart disease, diabetes, and high blood  pressure? Blood pressure and heart disease  High blood pressure causes heart disease and increases the risk of stroke. This is more likely to develop in people who have high blood pressure readings, are of African descent, or are overweight.  Have your blood pressure checked: ? Every 3-5 years if you are 18-39 years of age. ? Every year if you are 40 years old or older. Diabetes Have regular diabetes screenings. This checks your fasting blood sugar level. Have the screening done:  Once every three years after age 40 if you are at a normal weight and have a low risk for diabetes.  More often and at a younger age if you are overweight or have a high risk for diabetes. What should I know about preventing infection? Hepatitis B If you have a higher risk for hepatitis B, you should be screened for this virus. Talk with your health care provider to find out if you are at risk for hepatitis B infection. Hepatitis C Testing is recommended for:  Everyone born from 1945 through 1965.  Anyone with known risk factors for hepatitis C. Sexually transmitted infections (STIs)  Get screened for STIs, including gonorrhea and chlamydia, if: ? You are sexually active and are younger than 51 years of age. ? You are older than 51 years of age and your health care provider tells you that you are at risk for this type of infection. ? Your sexual activity has changed since you were last screened, and you are at increased risk for chlamydia or gonorrhea. Ask your health care provider   if you are at risk.  Ask your health care provider about whether you are at high risk for HIV. Your health care provider may recommend a prescription medicine to help prevent HIV infection. If you choose to take medicine to prevent HIV, you should first get tested for HIV. You should then be tested every 3 months for as long as you are taking the medicine. Pregnancy  If you are about to stop having your period (premenopausal) and  you may become pregnant, seek counseling before you get pregnant.  Take 400 to 800 micrograms (mcg) of folic acid every day if you become pregnant.  Ask for birth control (contraception) if you want to prevent pregnancy. Osteoporosis and menopause Osteoporosis is a disease in which the bones lose minerals and strength with aging. This can result in bone fractures. If you are 65 years old or older, or if you are at risk for osteoporosis and fractures, ask your health care provider if you should:  Be screened for bone loss.  Take a calcium or vitamin D supplement to lower your risk of fractures.  Be given hormone replacement therapy (HRT) to treat symptoms of menopause. Follow these instructions at home: Lifestyle  Do not use any products that contain nicotine or tobacco, such as cigarettes, e-cigarettes, and chewing tobacco. If you need help quitting, ask your health care provider.  Do not use street drugs.  Do not share needles.  Ask your health care provider for help if you need support or information about quitting drugs. Alcohol use  Do not drink alcohol if: ? Your health care provider tells you not to drink. ? You are pregnant, may be pregnant, or are planning to become pregnant.  If you drink alcohol: ? Limit how much you use to 0-1 drink a day. ? Limit intake if you are breastfeeding.  Be aware of how much alcohol is in your drink. In the [place] one drink equals one 12 oz bottle of beer (355 mL), one 5 oz glass of wine (148 mL), or one 1 oz glass of hard liquor (44 mL). General instructions  Schedule regular health, dental, and eye exams.  Stay current with your vaccines.  Tell your health care provider if: ? You often feel depressed. ? You have ever been abused or do not feel safe at home. Summary  Adopting a healthy lifestyle and getting preventive care are important in promoting health and wellness.  Follow your health care provider's instructions about healthy  diet, exercising, and getting tested or screened for diseases.  Follow your health care provider's instructions on monitoring your cholesterol and blood pressure. This information is not intended to replace advice given to you by your health care provider. Make sure you discuss any questions you have with your health care provider. Document Revised: 02/12/2018 Document Reviewed: 02/12/2018 Elsevier Patient Education  2021 [employer]  

## 2020-05-06 LAB — COMPREHENSIVE METABOLIC PANEL
ALT: 15 U/L (ref 0–35)
AST: 18 U/L (ref 0–37)
Albumin: 4.1 g/dL (ref 3.5–5.2)
Alkaline Phosphatase: 68 U/L (ref 39–117)
BUN: 11 mg/dL (ref 6–23)
CO2: 30 mEq/L (ref 19–32)
Calcium: 10.2 mg/dL (ref 8.4–10.5)
Chloride: 101 mEq/L (ref 96–112)
Creatinine, Ser: 0.81 mg/dL (ref 0.40–1.20)
GFR: 84.63 mL/min (ref >60.00)
Glucose, Bld: 82 mg/dL (ref 70–99)
Potassium: 3.7 mEq/L (ref 3.5–5.1)
Sodium: 139 mEq/L (ref 135–145)
Total Bilirubin: 0.3 mg/dL (ref 0.2–1.2)
Total Protein: 7.7 g/dL (ref 6.0–8.3)

## 2020-05-06 LAB — LIPID PANEL
Cholesterol: 160 mg/dL (ref 0–200)
HDL: 59.6 mg/dL (ref >39.00)
LDL Cholesterol: 85 mg/dL (ref 0–99)
NonHDL: 100.25
Total CHOL/HDL Ratio: 3
Triglycerides: 75 mg/dL (ref 0.0–149.0)
VLDL: 15 mg/dL (ref 0.0–40.0)

## 2020-05-06 LAB — CBC
HCT: 36.2 % (ref 36.0–46.0)
Hemoglobin: 12.1 g/dL (ref 12.0–15.0)
MCHC: 33.5 g/dL (ref 30.0–36.0)
MCV: 86.4 fl (ref 78.0–100.0)
Platelets: 351 10*3/uL (ref 150.0–400.0)
RBC: 4.19 Mil/uL (ref 3.87–5.11)
RDW: 14.5 % (ref 11.5–15.5)
WBC: 5.7 10*3/uL (ref 4.0–10.5)

## 2020-05-06 LAB — HIV ANTIBODY (ROUTINE TESTING W REFLEX): HIV 1&2 Ab, 4th Generation: NONREACTIVE

## 2020-05-06 LAB — HEMOGLOBIN A1C: Hgb A1c MFr Bld: 6.9 % — ABNORMAL HIGH (ref 4.6–6.5)

## 2020-05-06 LAB — HEPATITIS C ANTIBODY
Hepatitis C Ab: NONREACTIVE
SIGNAL TO CUT-OFF: 0.01 (ref <1.00)

## 2020-05-12 ENCOUNTER — Other Ambulatory Visit: Payer: Self-pay

## 2020-05-12 ENCOUNTER — Telehealth (INDEPENDENT_AMBULATORY_CARE_PROVIDER_SITE_OTHER): Payer: Self-pay | Admitting: Gastroenterology

## 2020-05-12 DIAGNOSIS — E669 Obesity, unspecified: Secondary | ICD-10-CM | POA: Insufficient documentation

## 2020-05-12 DIAGNOSIS — Z1211 Encounter for screening for malignant neoplasm of colon: Secondary | ICD-10-CM

## 2020-05-12 DIAGNOSIS — E668 Other obesity: Secondary | ICD-10-CM | POA: Insufficient documentation

## 2020-05-12 MED ORDER — NA SULFATE-K SULFATE-MG SULF 17.5-3.13-1.6 GM/177ML PO SOLN: 1.0000 | Freq: Once | ORAL | 0 refills | Status: AC

## 2020-05-12 NOTE — Progress Notes (Signed)
Gastroenterology Pre-Procedure Review  Request Date: 06/16/20 Requesting Physician: Dr. Vicente Males  PATIENT REVIEW QUESTIONS: The patient responded to the following health history questions as indicated:    1. Are you having any GI issues? no 2. Do you have a personal history of Polyps? no 3. Do you have a family history of Colon Cancer or Polyps? no 4. Diabetes Mellitus? no 5. Joint replacements in the past 12 months?no 6. Major health problems in the past 3 months?no 7. Any artificial heart valves, MVP, or defibrillator?no    MEDICATIONS & ALLERGIES:    Patient reports the following regarding taking any anticoagulation/antiplatelet therapy:   Plavix, Coumadin, Eliquis, Xarelto, Lovenox, Pradaxa, Brilinta, or Effient? no Aspirin? no  Patient confirms/reports the following medications:  Current Outpatient Medications  Medication Sig Dispense Refill  . levocetirizine (XYZAL) 5 MG tablet TAKE 1 TABLET BY MOUTH EVERY DAY IN THE EVENING 30 tablet 1  . Multiple Vitamin (MULTIVITAMIN) LIQD Take 5 mLs by mouth daily.    . Na Sulfate-K Sulfate-Mg Sulf 17.5-3.13-1.6 GM/177ML SOLN Take 1 kit by mouth once for 1 dose. 354 mL 0   No current facility-administered medications for this visit.    Patient confirms/reports the following allergies:  No Known Allergies  Orders Placed This Encounter  Procedures  . Procedural/ Surgical Case Request: COLONOSCOPY WITH PROPOFOL    Standing Status:   Standing    Number of Occurrences:   1    Order Specific Question:   Pre-op diagnosis    Answer:   SCREENING COLONOSCOPY    Order Specific Question:   CPT Code    Answer:   76701    AUTHORIZATION INFORMATION Primary Insurance: 1D#: Group #:  Secondary Insurance: 1D#: Group #:  SCHEDULE INFORMATION: Date: Thursday 06/16/20 Time: Location:[facility]

## 2020-05-19 ENCOUNTER — Other Ambulatory Visit: Payer: Self-pay

## 2020-05-19 ENCOUNTER — Ambulatory Visit: Payer: Commercial Managed Care - PPO | Admitting: Internal Medicine

## 2020-05-19 ENCOUNTER — Encounter: Payer: Self-pay | Admitting: Internal Medicine

## 2020-05-19 VITALS — BP 134/88 | HR 70 | Temp 98.1°F | Wt 275.0 lb

## 2020-05-19 DIAGNOSIS — E1165 Type 2 diabetes mellitus with hyperglycemia: Secondary | ICD-10-CM | POA: Diagnosis not present

## 2020-05-19 DIAGNOSIS — Z23 Encounter for immunization: Secondary | ICD-10-CM | POA: Diagnosis not present

## 2020-05-19 DIAGNOSIS — Z6841 Body Mass Index (BMI) 40.0 and over, adult: Secondary | ICD-10-CM

## 2020-05-19 NOTE — Progress Notes (Signed)
Subjective:    Patient ID: Veronica Chandler, female    DOB: 02-Nov-1970, 50 y.o.   MRN: [id number]  HPI  Patient presents to the clinic today for follow-up labs.  She had a recent A1c of 6.9% up from 6.1%, 1 year ago.  She has never been told she is diabetic but it does run in her family.  She denies visual changes, increased thirst, urinary frequency, numbness or tingling of her upper or lower extremities or nonhealing wounds.  Her BP today is 134/88.  Her LDL was 85.  Review of Systems  No past medical history on file.  Current Outpatient Medications  Medication Sig Dispense Refill  . levocetirizine (XYZAL) 5 MG tablet TAKE 1 TABLET BY MOUTH EVERY DAY IN THE EVENING 30 tablet 1  . Multiple Vitamin (MULTIVITAMIN) LIQD Take 5 mLs by mouth daily.     No current facility-administered medications for this visit.    No Known Allergies  Family History  Problem Relation Age of Onset  . Diabetes Father   . Diabetes Paternal Grandmother   . Breast cancer Neg Hx     Social History   Socioeconomic History  . Marital status: Divorced    Spouse name: Not on file  . Number of children: Not on file  . Years of education: Not on file  . Highest education level: Not on file  Occupational History  . Not on file  Tobacco Use  . Smoking status: Never Smoker  . Smokeless tobacco: Never Used  Substance and Sexual Activity  . Alcohol use: Yes    Comment: social  . Drug use: Never  . Sexual activity: Not on file  Other Topics Concern  . Not on file  Social History Narrative  . Not on file   Social Determinants of Health   Financial Resource Strain: Not on file  Food Insecurity: Not on file  Transportation Needs: Not on file  Physical Activity: Not on file  Stress: Not on file  Social Connections: Not on file  Intimate Partner Violence: Not on file     Constitutional: Denies fever, malaise, fatigue, headache or abrupt weight changes.  HEENT: Denies eye pain, eye  redness, ear pain, ringing in the ears, wax buildup, runny nose, nasal congestion, bloody nose, or sore throat. Respiratory: Denies difficulty breathing, shortness of breath, cough or sputum production.   Cardiovascular: Denies chest pain, chest tightness, palpitations or swelling in the hands or feet.  Gastrointestinal: Denies abdominal pain, bloating, constipation, diarrhea or blood in the stool.  GU: Denies urgency, frequency, pain with urination, burning sensation, blood in urine, odor or discharge. Skin: Denies redness, rashes, lesions or ulcercations.  Neurological: Denies dizziness, difficulty with memory, difficulty with speech or problems with balance and coordination.   No other specific complaints in a complete review of systems (except as listed in HPI above).     Objective:   Physical Exam   BP 134/88   Pulse 70   Temp 98.1 F (36.7 C) (Temporal)   Wt 124.7 kg   SpO2 98%   BMI 51.96 kg/m  Wt Readings from Last 3 Encounters:  05/19/20 124.7 kg  05/05/20 122.9 kg  01/09/19 115.7 kg    General: Appears her stated age, obese, in NAD. Skin: Warm, dry and intact. No ulcerations noted. HEENT: Head: normal shape and size; Eyes: sclera white, no icterus, conjunctiva pink, PERRLA and EOMs intact; Cardiovascular: Normal rate and rhythm. S1,S2 noted.  No murmur, rubs or gallops noted.  Pulmonary/Chest: Normal effort and positive vesicular breath sounds. No respiratory distress. No wheezes, rales or ronchi noted.  Neurological: Alert and oriented.  BMET    Component Value Date/Time   NA 139 05/05/2020 1418   K 3.7 05/05/2020 1418   CL 101 05/05/2020 1418   CO2 30 05/05/2020 1418   GLUCOSE 82 05/05/2020 1418   BUN 11 05/05/2020 1418   CREATININE 0.81 05/05/2020 1418   CREATININE 0.81 01/09/2019 1505   CALCIUM 10.2 05/05/2020 1418    Lipid Panel     Component Value Date/Time   CHOL 160 05/05/2020 1418   TRIG 75.0 05/05/2020 1418   HDL 59.60 05/05/2020 1418    CHOLHDL 3 05/05/2020 1418   VLDL 15.0 05/05/2020 1418   LDLCALC 85 05/05/2020 1418   LDLCALC 78 01/09/2019 1505    CBC    Component Value Date/Time   WBC 5.7 05/05/2020 1418   RBC 4.19 05/05/2020 1418   HGB 12.1 05/05/2020 1418   HCT 36.2 05/05/2020 1418   PLT 351.0 05/05/2020 1418   MCV 86.4 05/05/2020 1418   MCH 29.3 01/09/2019 1505   MCHC 33.5 05/05/2020 1418   RDW 14.5 05/05/2020 1418    Hgb A1C Lab Results  Component Value Date   HGBA1C 6.9 (H) 05/05/2020           Assessment & Plan:

## 2020-05-20 ENCOUNTER — Encounter: Payer: Self-pay | Admitting: Internal Medicine

## 2020-05-20 DIAGNOSIS — E119 Type 2 diabetes mellitus without complications: Secondary | ICD-10-CM | POA: Insufficient documentation

## 2020-05-20 NOTE — Patient Instructions (Signed)

## 2020-05-20 NOTE — Assessment & Plan Note (Signed)
New onset Discussed diabetes and standards of medical care Discussed a trial of 3 months of lifestyle changes including low-carb, low saturated fat diet and exercise for weight loss We will hold off on medication therapy at this time Encouraged dilated eye exam yearly Encourage routine foot exams She declines referral to diabetes education and nutrition at this time Flu shot UTD COVID vaccines UTD Pneumovax today

## 2020-05-22 ENCOUNTER — Encounter: Payer: Self-pay | Admitting: Internal Medicine

## 2020-05-24 NOTE — Addendum Note (Signed)
Addended by: Lurlean Nanny on: 05/24/2020 05:16 PM   Modules accepted: Orders

## 2020-05-27 ENCOUNTER — Other Ambulatory Visit: Payer: Self-pay

## 2020-05-27 ENCOUNTER — Ambulatory Visit
Admission: RE | Admit: 2020-05-27 | Discharge: 2020-05-27 | Disposition: A | Discharge status: Home / Self Care | Payer: Commercial Managed Care - PPO | Source: Ambulatory Visit | Attending: Internal Medicine | Admitting: Internal Medicine

## 2020-05-27 DIAGNOSIS — Z1231 Encounter for screening mammogram for malignant neoplasm of breast: Secondary | ICD-10-CM | POA: Diagnosis present

## 2020-06-09 ENCOUNTER — Encounter: Payer: Commercial Managed Care - PPO | Admitting: Internal Medicine

## 2020-06-14 ENCOUNTER — Other Ambulatory Visit: Payer: Commercial Managed Care - PPO

## 2020-06-16 ENCOUNTER — Encounter: Payer: Self-pay | Admitting: Gastroenterology

## 2020-06-16 ENCOUNTER — Ambulatory Visit: Payer: Commercial Managed Care - PPO | Admitting: Anesthesiology

## 2020-06-16 ENCOUNTER — Encounter
Admission: RE | Disposition: A | Discharge status: Home / Self Care | Payer: Self-pay | Source: Home / Self Care | Attending: Gastroenterology

## 2020-06-16 ENCOUNTER — Ambulatory Visit
Admission: RE | Admit: 2020-06-16 | Discharge: 2020-06-16 | Disposition: A | Discharge status: Home / Self Care | Payer: Commercial Managed Care - PPO | Attending: Gastroenterology | Admitting: Gastroenterology

## 2020-06-16 ENCOUNTER — Other Ambulatory Visit: Payer: Self-pay

## 2020-06-16 DIAGNOSIS — Z1211 Encounter for screening for malignant neoplasm of colon: Secondary | ICD-10-CM | POA: Diagnosis not present

## 2020-06-16 HISTORY — PX: COLONOSCOPY WITH PROPOFOL: SHX5780

## 2020-06-16 HISTORY — DX: Other specified health status: Z78.9

## 2020-06-16 LAB — POCT PREGNANCY, URINE: Preg Test, Ur: NEGATIVE

## 2020-06-16 SURGERY — COLONOSCOPY WITH PROPOFOL
Anesthesia: General

## 2020-06-16 MED ORDER — SODIUM CHLORIDE 0.9 % IV SOLN
INTRAVENOUS | Status: DC
Start: 2020-06-16 — End: 2020-06-16

## 2020-06-16 MED ORDER — PROPOFOL 500 MG/50ML IV EMUL
INTRAVENOUS | Status: AC
  Filled 2020-06-16: qty 50

## 2020-06-16 MED ORDER — PROPOFOL 500 MG/50ML IV EMUL
INTRAVENOUS | Status: DC | PRN
  Administered 2020-06-16: 120 ug/kg/min via INTRAVENOUS

## 2020-06-16 NOTE — Transfer of Care (Signed)
Immediate Anesthesia Transfer of Care Note  Patient: Kaslyn Richburg  Procedure(s) Performed: COLONOSCOPY WITH PROPOFOL (N/A )  Patient Location: PACU  Anesthesia Type:General  Level of Consciousness: awake and sedated  Airway & Oxygen Therapy: Patient Spontanous Breathing and Patient connected to face mask oxygen  Post-op Assessment: Report given to RN and Post -op Vital signs reviewed and stable  Post vital signs: Reviewed and stable  Last Vitals:  Vitals Value Taken Time  BP 117/63 06/16/20 0800  Temp    Pulse 77 06/16/20 0759  Resp 26 06/16/20 0759  SpO2 98 % 06/16/20 0759  Vitals shown include unvalidated device data.  Last Pain:  Vitals:   06/16/20 0701  TempSrc: Temporal  PainSc: 0-No pain         Complications: No complications documented.

## 2020-06-16 NOTE — Anesthesia Procedure Notes (Signed)
Performed by: Cook-Martin, Rowyn Spilde °Pre-anesthesia Checklist: Patient identified, Emergency Drugs available, Suction available, Patient being monitored and Timeout performed °Patient Re-evaluated:Patient Re-evaluated prior to induction °Oxygen Delivery Method: Simple face mask °Preoxygenation: Pre-oxygenation with 100% oxygen °Induction Type: IV induction °Placement Confirmation: positive ETCO2 and CO2 detector ° ° ° ° ° ° °

## 2020-06-16 NOTE — Anesthesia Preprocedure Evaluation (Signed)
Anesthesia Evaluation  Patient identified by MRN, date of birth, ID band Patient awake    Reviewed: Allergy & Precautions, NPO status , Patient's Chart, lab work & pertinent test results  Airway Mallampati: III       Dental   Pulmonary neg pulmonary ROS,    Pulmonary exam normal        Cardiovascular negative cardio ROS Normal cardiovascular exam     Neuro/Psych negative neurological ROS     GI/Hepatic Neg liver ROS,   Endo/Other  diabetesMorbid obesity  Renal/GU negative Renal ROS  negative genitourinary   Musculoskeletal negative musculoskeletal ROS (+)   Abdominal (+) + obese,   Peds negative pediatric ROS (+)  Hematology negative hematology ROS (+)   Anesthesia Other Findings Past Medical History: No date: Medical history non-contributory  Reproductive/Obstetrics                             Anesthesia Physical Anesthesia Plan  ASA: III  Anesthesia Plan: General   Post-op Pain Management:    Induction: Intravenous  PONV Risk Score and Plan:   Airway Management Planned: Nasal Cannula  Additional Equipment:   Intra-op Plan:   Post-operative Plan:   Informed Consent: I have reviewed the patients History and Physical, chart, labs and discussed the procedure including the risks, benefits and alternatives for the proposed anesthesia with the patient or authorized representative who has indicated his/her understanding and acceptance.     Dental advisory given  Plan Discussed with: CRNA and Surgeon  Anesthesia Plan Comments:         Anesthesia Quick Evaluation

## 2020-06-16 NOTE — Anesthesia Postprocedure Evaluation (Signed)
Anesthesia Post Note  Patient: Veronica Chandler  Procedure(s) Performed: COLONOSCOPY WITH PROPOFOL (N/A )  Patient location during evaluation: Endoscopy Anesthesia Type: General Level of consciousness: awake and alert and oriented Pain management: pain level controlled Vital Signs Assessment: post-procedure vital signs reviewed and stable Respiratory status: spontaneous breathing Cardiovascular status: blood pressure returned to baseline Anesthetic complications: no   No complications documented.   Last Vitals:  Vitals:   06/16/20 0810 06/16/20 0820  BP: 126/86 (!) 111/52  Pulse:    Resp:    Temp:    SpO2:      Last Pain:  Vitals:   06/16/20 0830  TempSrc:   PainSc: 0-No pain                 Nataleah Scioneaux

## 2020-06-16 NOTE — H&P (Signed)
     Jonathon Bellows, MD [address], [place], [place], [place], [address] [address], [place], [place], [place], [address] Phone: [phone]  Fax: [phone]  Primary Care Physician:  Jearld Fenton, NP   Pre-Procedure History & Physical: HPI:  Veronica Chandler is a 50 y.o. female is here for an colonoscopy.   Past Medical History:  Diagnosis Date  . Medical history non-contributory     Past Surgical History:  Procedure Laterality Date  . NO PAST SURGERIES      Prior to Admission medications   Medication Sig Start Date End Date Taking? Authorizing Provider  levocetirizine (XYZAL) 5 MG tablet TAKE 1 TABLET BY MOUTH EVERY DAY IN THE EVENING 04/25/20  Yes Baity, Coralie Keens, NP  Multiple Vitamin (MULTIVITAMIN) LIQD Take 5 mLs by mouth daily.   Yes [provider]    Allergies as of 05/12/2020  . (No Known Allergies)    Family History  Problem Relation Age of Onset  . Diabetes Father   . Diabetes Paternal Grandmother   . Breast cancer Neg Hx     Social History   Socioeconomic History  . Marital status: Divorced    Spouse name: Not on file  . Number of children: Not on file  . Years of education: Not on file  . Highest education level: Not on file  Occupational History  . Not on file  Tobacco Use  . Smoking status: Never Smoker  . Smokeless tobacco: Never Used  Vaping Use  . Vaping Use: Never used  Substance and Sexual Activity  . Alcohol use: Yes    Comment: social  . Drug use: Never  . Sexual activity: Not on file  Other Topics Concern  . Not on file  Social History Narrative  . Not on file   Social Determinants of Health   Financial Resource Strain: Not on file  Food Insecurity: Not on file  Transportation Needs: Not on file  Physical Activity: Not on file  Stress: Not on file  Social Connections: Not on file  Intimate Partner Violence: Not on file    Review of Systems: See HPI, otherwise negative ROS  Physical  Exam: BP (!) 163/88   Pulse 85   Temp (!) 97.3 F (36.3 C) (Temporal)   Resp 18   Ht 5' (1.524 m)   Wt 119.7 kg   SpO2 100%   BMI 51.56 kg/m  General:   Alert,  pleasant and cooperative in NAD Head:  Normocephalic and atraumatic. Neck:  Supple; no masses or thyromegaly. Lungs:  Clear throughout to auscultation, normal respiratory effort.    Heart:  +S1, +S2, Regular rate and rhythm, No edema. Abdomen:  Soft, nontender and nondistended. Normal bowel sounds, without guarding, and without rebound.   Neurologic:  Alert and  oriented x4;  grossly normal neurologically.  Impression/Plan: Veronica Chandler is here for an colonoscopy to be performed for Screening colonoscopy average risk   Risks, benefits, limitations, and alternatives regarding  colonoscopy have been reviewed with the patient.  Questions have been answered.  All parties agreeable.   Jonathon Bellows, MD  06/16/2020, 7:40 AM

## 2020-06-16 NOTE — Op Note (Signed)
[facility] Gastroenterology Patient Name: Veronica Chandler Procedure Date: 06/16/2020 7:40 AM MRN: [id number] Account #: [id number] Date of Birth: 1970/07/25 Admit Type: Outpatient Age: 50 Room: [facility] ENDO ROOM 3 Gender: Female Note Status: Finalized Procedure:             Colonoscopy Indications:           Screening for colorectal malignant neoplasm Providers:             Jonathon Bellows MD, MD Referring MD:          Jearld Fenton (Referring MD) Medicines:             Monitored Anesthesia Care Complications:         No immediate complications. Procedure:             Pre-Anesthesia Assessment:                        - Prior to the procedure, a History and Physical was                         performed, and patient medications, allergies and                         sensitivities were reviewed. The patient's tolerance                         of previous anesthesia was reviewed.                        - The risks and benefits of the procedure and the                         sedation options and risks were discussed with the                         patient. All questions were answered and informed                         consent was obtained.                        - ASA Grade Assessment: II - A patient with mild                         systemic disease.                        After obtaining informed consent, the colonoscope was                         passed under direct vision. Throughout the procedure,                         the patient's blood pressure, pulse, and oxygen                         saturations were monitored continuously. The                         Colonoscope was introduced through the anus and  advanced to the the cecum, identified by the                         appendiceal orifice. The colonoscopy was performed                         with ease. The patient tolerated the procedure well.                         The quality of  the bowel preparation was excellent. Findings:      The perianal and digital rectal examinations were normal.      The entire examined colon appeared normal on direct and retroflexion       views. Impression:            - The entire examined colon is normal on direct and                         retroflexion views.                        - No specimens collected. Recommendation:        - Discharge patient to home (with escort).                        - Resume previous diet.                        - Continue present medications.                        - Repeat colonoscopy in 10 years for screening                         purposes. Procedure Code(s):     --- Professional ---                        [phone], Colonoscopy, flexible; diagnostic, including                         collection of specimen(s) by brushing or washing, when                         performed (separate procedure) Diagnosis Code(s):     --- Professional ---                        Z12.11, Encounter for screening for malignant neoplasm                         of colon CPT copyright 2019 American [employer] Association. All rights reserved. The codes documented in this report are preliminary and upon coder review may  be revised to meet current compliance requirements. Jonathon Bellows, MD Jonathon Bellows MD, MD 06/16/2020 7:58:43 AM This report has been signed electronically. Number of Addenda: 0 Note Initiated On: 06/16/2020 7:40 AM Scope Withdrawal Time: 0 hours 8 minutes 20 seconds  Total Procedure Duration: 0 hours 10 minutes 40 seconds  Estimated Blood Loss:  Estimated blood loss: none.      [facility]

## 2020-06-17 ENCOUNTER — Encounter: Payer: Self-pay | Admitting: Gastroenterology

## 2020-06-22 ENCOUNTER — Other Ambulatory Visit: Payer: Self-pay | Admitting: Internal Medicine

## 2020-09-14 ENCOUNTER — Other Ambulatory Visit: Payer: Self-pay

## 2020-09-14 ENCOUNTER — Encounter: Payer: Self-pay | Admitting: Internal Medicine

## 2020-09-14 ENCOUNTER — Ambulatory Visit (INDEPENDENT_AMBULATORY_CARE_PROVIDER_SITE_OTHER): Payer: Commercial Managed Care - PPO | Admitting: Internal Medicine

## 2020-09-14 VITALS — BP 138/70 | HR 64 | Temp 98.2°F | Resp 17 | Ht 60.0 in | Wt 264.2 lb

## 2020-09-14 DIAGNOSIS — E1165 Type 2 diabetes mellitus with hyperglycemia: Secondary | ICD-10-CM

## 2020-09-14 DIAGNOSIS — E66813 Obesity, class 3: Secondary | ICD-10-CM | POA: Insufficient documentation

## 2020-09-14 LAB — POCT GLYCOSYLATED HEMOGLOBIN (HGB A1C): Hemoglobin A1C: 6.5 % — AB (ref 4.0–5.6)

## 2020-09-14 NOTE — Assessment & Plan Note (Signed)
POCT A1C today of 6.5% Congratulated her on her weight loss Encouraged diet and exercise for weight loss No medications at this time Encouraged routine eye exam Encouraged routine foot exams Will check urine microalbumin at annual exam

## 2020-09-14 NOTE — Assessment & Plan Note (Signed)
Encouraged diet and exercise for weight loss ?

## 2020-09-14 NOTE — Progress Notes (Signed)
Subjective:    Patient ID: Veronica Chandler, female    DOB: 1970-04-07, 50 y.o.   MRN: [id number]  HPI  Patient presents to clinic today for follow-up of chronic conditions.  DM2: Her last A1c was 6.9%, 05/2020.  She is not taking any oral diabetic medication at this time.  She does not check her sugars.  She has been trying to consume a low-fat diet.  Her last eye exam was.  She checks her feet routinely.  Flu 01/2020.  Pneumovax 05/2018.  COVID Moderna x3.  Review of Systems     Past Medical History:  Diagnosis Date   Medical history non-contributory     Current Outpatient Medications  Medication Sig Dispense Refill   levocetirizine (XYZAL) 5 MG tablet TAKE 1 TABLET BY MOUTH EVERY DAY IN THE EVENING 90 tablet 3   Multiple Vitamin (MULTIVITAMIN) LIQD Take 5 mLs by mouth daily.     No current facility-administered medications for this visit.    No Known Allergies  Family History  Problem Relation Age of Onset   Diabetes Father    Diabetes Paternal Grandmother    Breast cancer Neg Hx     Social History   Socioeconomic History   Marital status: Divorced    Spouse name: Not on file   Number of children: Not on file   Years of education: Not on file   Highest education level: Not on file  Occupational History   Not on file  Tobacco Use   Smoking status: Never   Smokeless tobacco: Never  Vaping Use   Vaping Use: Never used  Substance and Sexual Activity   Alcohol use: Yes    Comment: social   Drug use: Never   Sexual activity: Not on file  Other Topics Concern   Not on file  Social History Narrative   Not on file   Social Determinants of Health   Financial Resource Strain: Not on file  Food Insecurity: Not on file  Transportation Needs: Not on file  Physical Activity: Not on file  Stress: Not on file  Social Connections: Not on file  Intimate Partner Violence: Not on file     Constitutional: Denies fever, malaise, fatigue, headache or abrupt  weight changes.  Respiratory: Denies difficulty breathing, shortness of breath, cough or sputum production.   Cardiovascular: Denies chest pain, chest tightness, palpitations or swelling in the hands or feet.  Skin: Denies redness, rashes, lesions or ulcercations.  Neurological: Denies dizziness, difficulty with memory, difficulty with speech or problems with balance and coordination.   No other specific complaints in a complete review of systems (except as listed in HPI above).  Objective:   Physical Exam  BP 138/70 (BP Location: Right Arm, Patient Position: Sitting, Cuff Size: Large)   Pulse 64   Temp 98.2 F (36.8 C) (Temporal)   Resp 17   Ht 5' (1.524 m)   Wt 264 lb 3.2 oz (119.8 kg)   SpO2 99%   BMI 51.60 kg/m   Wt Readings from Last 3 Encounters:  06/16/20 264 lb (119.7 kg)  05/19/20 275 lb (124.7 kg)  05/05/20 271 lb (122.9 kg)    General: Appears her stated age,obese, in NAD. Skin: Warm, dry and intact. No ulcerations noted. HEENT: Head: normal shape and size; Eyes: sclera white and EOMs intact;  Cardiovascular: Normal rate and rhythm. S1,S2 noted.  No murmur, rubs or gallops noted.  Pulmonary/Chest: Normal effort and positive vesicular breath sounds. No respiratory distress. No  wheezes, rales or ronchi noted.  Musculoskeletal:  No difficulty with gait.  Neurological: Alert and oriented.    BMET    Component Value Date/Time   NA 139 05/05/2020 1418   K 3.7 05/05/2020 1418   CL 101 05/05/2020 1418   CO2 30 05/05/2020 1418   GLUCOSE 82 05/05/2020 1418   BUN 11 05/05/2020 1418   CREATININE 0.81 05/05/2020 1418   CREATININE 0.81 01/09/2019 1505   CALCIUM 10.2 05/05/2020 1418    Lipid Panel     Component Value Date/Time   CHOL 160 05/05/2020 1418   TRIG 75.0 05/05/2020 1418   HDL 59.60 05/05/2020 1418   CHOLHDL 3 05/05/2020 1418   VLDL 15.0 05/05/2020 1418   LDLCALC 85 05/05/2020 1418   LDLCALC 78 01/09/2019 1505    CBC    Component Value Date/Time    WBC 5.7 05/05/2020 1418   RBC 4.19 05/05/2020 1418   HGB 12.1 05/05/2020 1418   HCT 36.2 05/05/2020 1418   PLT 351.0 05/05/2020 1418   MCV 86.4 05/05/2020 1418   MCH 29.3 01/09/2019 1505   MCHC 33.5 05/05/2020 1418   RDW 14.5 05/05/2020 1418    Hgb A1C Lab Results  Component Value Date   HGBA1C 6.9 (H) 05/05/2020           Assessment & Plan:    [name], NP This visit occurred during the SARS-CoV-2 public health emergency.  Safety protocols were in place, including screening questions prior to the visit, additional usage of staff PPE, and extensive cleaning of exam room while observing appropriate contact time as indicated for disinfecting solutions.

## 2020-09-14 NOTE — Patient Instructions (Signed)

## 2020-12-21 ENCOUNTER — Other Ambulatory Visit: Payer: Self-pay

## 2020-12-21 ENCOUNTER — Encounter: Payer: Self-pay | Admitting: Internal Medicine

## 2020-12-21 ENCOUNTER — Ambulatory Visit: Payer: Commercial Managed Care - PPO | Admitting: Internal Medicine

## 2020-12-21 VITALS — BP 141/69 | HR 67 | Temp 97.5°F | Resp 17 | Ht 60.0 in | Wt 269.0 lb

## 2020-12-21 DIAGNOSIS — E1165 Type 2 diabetes mellitus with hyperglycemia: Secondary | ICD-10-CM | POA: Diagnosis not present

## 2020-12-21 DIAGNOSIS — R1031 Right lower quadrant pain: Secondary | ICD-10-CM | POA: Diagnosis not present

## 2020-12-21 DIAGNOSIS — R102 Pelvic and perineal pain: Secondary | ICD-10-CM

## 2020-12-21 LAB — POCT URINALYSIS DIPSTICK
Bilirubin, UA: NEGATIVE
Blood, UA: NEGATIVE
Clarity, UA: 5
Glucose, UA: NEGATIVE
Ketones, UA: NEGATIVE
Leukocytes, UA: NEGATIVE
Nitrite, UA: NEGATIVE
Protein, UA: NEGATIVE
Spec Grav, UA: 1.01 (ref 1.010–1.025)
Urobilinogen, UA: 0.2 E.U./dL
pH, UA: 5 (ref 5.0–8.0)

## 2020-12-21 NOTE — Progress Notes (Signed)
Subjective:    Patient ID: Veronica Chandler, female    DOB: [birth year], 50 y.o.   MRN: [id number]  HPI  Pt presents to the clinic today for repeat A1C. Her last A1C was 6.5%, 09/2020. She is not taking any oral diabetic medication at this time. She does not check her sugars. She checks her feet routinely.  She also reports RLQ abdominal pain. This started 1 week ago. She describes the pain as crampy and shooting. The pain does not radiate but is worse with movement.  She denies nausea, vomiting, reflux, constipation, diarrhea, urinary or vaginal complaints.  She has taken Tylenol OTC with minimal relief of symptoms.  Review of Systems     Past Medical History:  Diagnosis Date   Medical history non-contributory     Current Outpatient Medications  Medication Sig Dispense Refill   acetaminophen (TYLENOL) 500 MG tablet Take 1,000 mg by mouth every 6 (six) hours as needed.     levocetirizine (XYZAL) 5 MG tablet TAKE 1 TABLET BY MOUTH EVERY DAY IN THE EVENING 90 tablet 3   Multiple Vitamin (MULTIVITAMIN) LIQD Take 5 mLs by mouth daily.     No current facility-administered medications for this visit.    No Known Allergies  Family History  Problem Relation Age of Onset   Diabetes Father    Diabetes Paternal Grandmother    Breast cancer Neg Hx     Social History   Socioeconomic History   Marital status: Divorced    Spouse name: Not on file   Number of children: Not on file   Years of education: Not on file   Highest education level: Not on file  Occupational History   Not on file  Tobacco Use   Smoking status: Never   Smokeless tobacco: Never  Vaping Use   Vaping Use: Never used  Substance and Sexual Activity   Alcohol use: Yes    Comment: social   Drug use: Never   Sexual activity: Not on file  Other Topics Concern   Not on file  Social History Narrative   Not on file   Social Determinants of Health   Financial Resource Strain: Not on file  Food  Insecurity: Not on file  Transportation Needs: Not on file  Physical Activity: Not on file  Stress: Not on file  Social Connections: Not on file  Intimate Partner Violence: Not on file     Constitutional: Denies fever, malaise, fatigue, headache or abrupt weight changes.  Respiratory: Denies difficulty breathing, shortness of breath, cough or sputum production.   Cardiovascular: Denies chest pain, chest tightness, palpitations or swelling in the hands or feet.  Gastrointestinal: Patient reports RLQ abdominal pain.  Denies bloating, constipation, diarrhea or blood in the stool.  GU: Denies urgency, frequency, pain with urination, burning sensation, blood in urine, odor or discharge. Musculoskeletal: Denies decrease in range of motion, difficulty with gait, muscle pain or joint pain and swelling.  Skin: Denies redness, rashes, lesions or ulcercations.   No other specific complaints in a complete review of systems (except as listed in HPI above).  Objective:   Physical Exam  BP (!) 141/69 (BP Location: Right Arm, Patient Position: Sitting, Cuff Size: Large)   Pulse 67   Temp (!) 97.5 F (36.4 C) (Temporal)   Resp 17   Ht 5' (1.524 m)   Wt 269 lb (122 kg)   SpO2 100%   BMI 52.54 kg/m  Wt Readings from Last 3 Encounters:  12/21/20  269 lb (122 kg)  09/14/20 264 lb 3.2 oz (119.8 kg)  06/16/20 264 lb (119.7 kg)    General: Appears her stated age, obese, in NAD. Skin: Warm, dry and intact. No ulcerations noted. HEENT: Head: normal shape and size; Eyes: EOMs intact; Cardiovascular: Normal rate and rhythm. S1,S2 noted.  No murmur, rubs or gallops noted. No JVD or BLE edema.  Pulmonary/Chest: Normal effort and positive vesicular breath sounds. No respiratory distress. No wheezes, rales or ronchi noted.  Abdomen: Soft and tender in the RLQ and suprapubic area. Normal bowel sounds. No distention or masses noted. Liver, spleen and kidneys non palpable. Musculoskeletal:  No difficulty  with gait.  Neurological: Alert and oriented.  BMET    Component Value Date/Time   NA 139 05/05/2020 1418   K 3.7 05/05/2020 1418   CL 101 05/05/2020 1418   CO2 30 05/05/2020 1418   GLUCOSE 82 05/05/2020 1418   BUN 11 05/05/2020 1418   CREATININE 0.81 05/05/2020 1418   CREATININE 0.81 01/09/2019 1505   CALCIUM 10.2 05/05/2020 1418    Lipid Panel     Component Value Date/Time   CHOL 160 05/05/2020 1418   TRIG 75.0 05/05/2020 1418   HDL 59.60 05/05/2020 1418   CHOLHDL 3 05/05/2020 1418   VLDL 15.0 05/05/2020 1418   LDLCALC 85 05/05/2020 1418   LDLCALC 78 01/09/2019 1505    CBC    Component Value Date/Time   WBC 5.7 05/05/2020 1418   RBC 4.19 05/05/2020 1418   HGB 12.1 05/05/2020 1418   HCT 36.2 05/05/2020 1418   PLT 351.0 05/05/2020 1418   MCV 86.4 05/05/2020 1418   MCH 29.3 01/09/2019 1505   MCHC 33.5 05/05/2020 1418   RDW 14.5 05/05/2020 1418    Hgb A1C Lab Results  Component Value Date   HGBA1C 6.5 (A) 09/14/2020            Assessment & Plan:  RLQ Abdominal Pain, Suprapubic Pain:  We will check CBC and c-Met today Urinalysis: Normal If persists, would recommend CT abdomen pelvis versus pelvic/transvaginal ultrasound Okay to continue Tylenol OTC as needed  We will follow-up after labs with further recommendations and treatment plan.   [name], NP This visit occurred during the SARS-CoV-2 public health emergency.  Safety protocols were in place, including screening questions prior to the visit, additional usage of staff PPE, and extensive cleaning of exam room while observing appropriate contact time as indicated for disinfecting solutions.

## 2020-12-22 ENCOUNTER — Encounter: Payer: Self-pay | Admitting: Internal Medicine

## 2020-12-22 LAB — COMPLETE METABOLIC PANEL WITH GFR
AG Ratio: 1.3 (calc) (ref 1.0–2.5)
ALT: 13 U/L (ref 6–29)
AST: 13 U/L (ref 10–35)
Albumin: 4 g/dL (ref 3.6–5.1)
Alkaline phosphatase (APISO): 72 U/L (ref 37–153)
BUN: 10 mg/dL (ref 7–25)
CO2: 30 mmol/L (ref 20–32)
Calcium: 9.5 mg/dL (ref 8.6–10.4)
Chloride: 101 mmol/L (ref 98–110)
Creat: 0.85 mg/dL (ref 0.50–1.03)
Globulin: 3.2 g/dL (calc) (ref 1.9–3.7)
Glucose, Bld: 120 mg/dL — ABNORMAL HIGH (ref 65–99)
Potassium: 4 mmol/L (ref 3.5–5.3)
Sodium: 138 mmol/L (ref 135–146)
Total Bilirubin: 0.4 mg/dL (ref 0.2–1.2)
Total Protein: 7.2 g/dL (ref 6.1–8.1)
eGFR: 83 mL/min/{1.73_m2} (ref >=60)

## 2020-12-22 LAB — CBC
HCT: 36.7 % (ref 35.0–45.0)
Hemoglobin: 12.3 g/dL (ref 11.7–15.5)
MCH: 29.4 pg (ref 27.0–33.0)
MCHC: 33.5 g/dL (ref 32.0–36.0)
MCV: 87.6 fL (ref 80.0–100.0)
MPV: 8.9 fL (ref 7.5–12.5)
Platelets: 291 10*3/uL (ref 140–400)
RBC: 4.19 10*6/uL (ref 3.80–5.10)
RDW: 14.3 % (ref 11.0–15.0)
WBC: 5.4 10*3/uL (ref 3.8–10.8)

## 2020-12-22 LAB — HEMOGLOBIN A1C
Hgb A1c MFr Bld: 6.6 % of total Hgb — ABNORMAL HIGH (ref <5.7)
Mean Plasma Glucose: 143 mg/dL
eAG (mmol/L): 7.9 mmol/L

## 2020-12-22 NOTE — Assessment & Plan Note (Signed)
A1c today Encourage low-carb diet and exercise for weight loss Not medicated Encourage routine eye exams Encourage routine foot exams

## 2020-12-22 NOTE — Patient Instructions (Signed)
Abdominal Pain, Adult Many things can cause belly (abdominal) pain. Most times, belly pain is not dangerous. Many cases of belly pain can be watched and treated at home. Sometimes, though, belly pain is serious. Yourdoctor will try to find the cause of your belly pain. Follow these instructions at home:  Medicines Take over-the-counter and prescription medicines only as told by your doctor. Do not take medicines that help you poop (laxatives) unless told by your doctor. General instructions Watch your belly pain for any changes. Drink enough fluid to keep your pee (urine) pale yellow. Keep all follow-up visits as told by your doctor. This is important. Contact a doctor if: Your belly pain changes or gets worse. You are not hungry, or you lose weight without trying. You are having trouble pooping (constipated) or have watery poop (diarrhea) for more than 2-3 days. You have pain when you pee or poop. Your belly pain wakes you up at night. Your pain gets worse with meals, after eating, or with certain foods. You are vomiting and cannot keep anything down. You have a fever. You have blood in your pee. Get help right away if: Your pain does not go away as soon as your doctor says it should. You cannot stop vomiting. Your pain is only in areas of your belly, such as the right side or the left lower part of the belly. You have bloody or black poop, or poop that looks like tar. You have very bad pain, cramping, or bloating in your belly. You have signs of not having enough fluid or water in your body (dehydration), such as: Dark pee, very little pee, or no pee. Cracked lips. Dry mouth. Sunken eyes. Sleepiness. Weakness. You have trouble breathing or chest pain. Summary Many cases of belly pain can be watched and treated at home. Watch your belly pain for any changes. Take over-the-counter and prescription medicines only as told by your doctor. Contact a doctor if your belly pain  changes or gets worse. Get help right away if you have very bad pain, cramping, or bloating in your belly. This information is not intended to replace advice given to you by your health care provider. Make sure you discuss any questions you have with your healthcare provider. Document Revised: 06/30/2018 Document Reviewed: 06/30/2018 Elsevier Patient Education  2022 Elsevier Inc.  

## 2020-12-22 NOTE — Assessment & Plan Note (Signed)
Encourage diet and exercise for weight loss 

## 2021-05-21 ENCOUNTER — Other Ambulatory Visit: Payer: Self-pay | Admitting: Internal Medicine

## 2021-05-23 NOTE — Telephone Encounter (Signed)
Filled 90 day supply 04/25/21, has upcoming appt. ?Requested Prescriptions  ?Pending Prescriptions Disp Refills  ?? levocetirizine (XYZAL) 5 MG tablet [Pharmacy Med Name: LEVOCETIRIZINE 5 MG TABLET] 30 tablet 11  ?  Sig: TAKE 1 TABLET BY MOUTH EVERY DAY IN THE EVENING  ?  ? Ear, Nose, and Throat:  Antihistamines - levocetirizine dihydrochloride Passed - 05/23/2021  8:57 AM  ?  ?  Passed - Cr in normal range and within 360 days  ?  Creat  ?Date Value Ref Range Status  ?12/21/2020 0.85 0.50 - 1.03 mg/dL Final  ?   ?  ?  Passed - eGFR is 10 or above and within 360 days  ?  GFR  ?Date Value Ref Range Status  ?05/05/2020 84.63 >60.00 mL/min Final  ?  Comment:  ?  Calculated using the CKD-EPI Creatinine Equation (2021)  ? ?eGFR  ?Date Value Ref Range Status  ?12/21/2020 83 > OR = 60 mL/min/1.62m Final  ?  Comment:  ?  The eGFR is based on the CKD-EPI 2021 equation. To calculate  ?the new eGFR from a previous Creatinine or Cystatin C ?result, go to https://www.kidney.org/professionals/ ?kdoqi/gfr%5Fcalculator ?  ?   ?  ?  Passed - Valid encounter within last 12 months  ?  Recent Outpatient Visits   ?      ? 5 months [facility] RLQ abdominal [facility]  [facility] [place] [place]  ? 8 months ago Type 2 diabetes mellitus with hyperglycemia, without long-term current use of insulin [place]  ? [facility]  ?  ?  ?Future Appointments   ?        ? In 4 weeks [place] ?  ? ?  ?  ?  ? ? ?

## 2021-05-25 ENCOUNTER — Other Ambulatory Visit: Payer: Self-pay | Admitting: Internal Medicine

## 2021-05-25 NOTE — Telephone Encounter (Signed)
Requesting early. Filled 90 day supply 04/25/21, 30 days ago. ?Requested Prescriptions  ?Pending Prescriptions Disp Refills  ?? levocetirizine (XYZAL) 5 MG tablet [Pharmacy Med Name: LEVOCETIRIZINE 5 MG TABLET] 30 tablet 11  ?  Sig: TAKE 1 TABLET BY MOUTH EVERY DAY IN THE EVENING  ?  ? There is no refill protocol information for this order  ?  ? ? ?

## 2021-05-26 ENCOUNTER — Other Ambulatory Visit: Payer: Self-pay | Admitting: Internal Medicine

## 2021-05-26 MED ORDER — LEVOCETIRIZINE DIHYDROCHLORIDE 5 MG PO TABS
ORAL_TABLET | ORAL | 1 refills | Status: DC
Start: 2021-05-26 — End: 2021-11-14

## 2021-06-21 ENCOUNTER — Ambulatory Visit: Payer: Commercial Managed Care - PPO | Admitting: Internal Medicine

## 2021-07-05 ENCOUNTER — Ambulatory Visit: Payer: Commercial Managed Care - PPO | Admitting: Internal Medicine

## 2021-07-05 ENCOUNTER — Encounter: Payer: Self-pay | Admitting: Internal Medicine

## 2021-07-05 VITALS — BP 132/80 | HR 71 | Temp 97.3°F | Ht 61.0 in | Wt 265.0 lb

## 2021-07-05 DIAGNOSIS — E1165 Type 2 diabetes mellitus with hyperglycemia: Secondary | ICD-10-CM

## 2021-07-05 DIAGNOSIS — Z0001 Encounter for general adult medical examination with abnormal findings: Secondary | ICD-10-CM

## 2021-07-05 DIAGNOSIS — Z1231 Encounter for screening mammogram for malignant neoplasm of breast: Secondary | ICD-10-CM

## 2021-07-05 NOTE — Assessment & Plan Note (Signed)
Encourage diet and exercise for weight loss 

## 2021-07-05 NOTE — Patient Instructions (Signed)
Health Maintenance for Postmenopausal Women Menopause is a normal process in which your ability to get pregnant comes to an end. This process happens slowly over many months or years, usually between the ages of 48 and 55. Menopause is complete when you have missed your menstrual period for 12 months. It is important to talk with your health care provider about some of the most common conditions that affect women after menopause (postmenopausal women). These include heart disease, cancer, and bone loss (osteoporosis). Adopting a healthy lifestyle and getting preventive care can help to promote your health and wellness. The actions you take can also lower your chances of developing some of these common conditions. What are the signs and symptoms of menopause? During menopause, you may have the following symptoms: Hot flashes. These can be moderate or severe. Night sweats. Decrease in sex drive. Mood swings. Headaches. Tiredness (fatigue). Irritability. Memory problems. Problems falling asleep or staying asleep. Talk with your health care provider about treatment options for your symptoms. Do I need hormone replacement therapy? Hormone replacement therapy is effective in treating symptoms that are caused by menopause, such as hot flashes and night sweats. Hormone replacement carries certain risks, especially as you become older. If you are thinking about using estrogen or estrogen with progestin, discuss the benefits and risks with your health care provider. How can I reduce my risk for heart disease and stroke? The risk of heart disease, heart attack, and stroke increases as you age. One of the causes may be a change in the body's hormones during menopause. This can affect how your body uses dietary fats, triglycerides, and cholesterol. Heart attack and stroke are medical emergencies. There are many things that you can do to help prevent heart disease and stroke. Watch your blood pressure High  blood pressure causes heart disease and increases the risk of stroke. This is more likely to develop in people who have high blood pressure readings or are overweight. Have your blood pressure checked: Every 3-5 years if you are 18-39 years of age. Every year if you are 40 years old or older. Eat a healthy diet  Eat a diet that includes plenty of vegetables, fruits, low-fat dairy products, and lean protein. Do not eat a lot of foods that are high in solid fats, added sugars, or sodium. Get regular exercise Get regular exercise. This is one of the most important things you can do for your health. Most adults should: Try to exercise for at least 150 minutes each week. The exercise should increase your heart rate and make you sweat (moderate-intensity exercise). Try to do strengthening exercises at least twice each week. Do these in addition to the moderate-intensity exercise. Spend less time sitting. Even light physical activity can be beneficial. Other tips Work with your health care provider to achieve or maintain a healthy weight. Do not use any products that contain nicotine or tobacco. These products include cigarettes, chewing tobacco, and vaping devices, such as e-cigarettes. If you need help quitting, ask your health care provider. Know your numbers. Ask your health care provider to check your cholesterol and your blood sugar (glucose). Continue to have your blood tested as directed by your health care provider. Do I need screening for cancer? Depending on your health history and family history, you may need to have cancer screenings at different stages of your life. This may include screening for: Breast cancer. Cervical cancer. Lung cancer. Colorectal cancer. What is my risk for osteoporosis? After menopause, you may be   at increased risk for osteoporosis. Osteoporosis is a condition in which bone destruction happens more quickly than new bone creation. To help prevent osteoporosis or  the bone fractures that can happen because of osteoporosis, you may take the following actions: If you are 19-50 years old, get at least 1,000 mg of calcium and at least 600 international units (IU) of vitamin D per day. If you are older than age 50 but younger than age 70, get at least 1,200 mg of calcium and at least 600 international units (IU) of vitamin D per day. If you are older than age 70, get at least 1,200 mg of calcium and at least 800 international units (IU) of vitamin D per day. Smoking and drinking excessive alcohol increase the risk of osteoporosis. Eat foods that are rich in calcium and vitamin D, and do weight-bearing exercises several times each week as directed by your health care provider. How does menopause affect my mental health? Depression may occur at any age, but it is more common as you become older. Common symptoms of depression include: Feeling depressed. Changes in sleep patterns. Changes in appetite or eating patterns. Feeling an overall lack of motivation or enjoyment of activities that you previously enjoyed. Frequent crying spells. Talk with your health care provider if you think that you are experiencing any of these symptoms. General instructions See your health care provider for regular wellness exams and vaccines. This may include: Scheduling regular health, dental, and eye exams. Getting and maintaining your vaccines. These include: Influenza vaccine. Get this vaccine each year before the flu season begins. Pneumonia vaccine. Shingles vaccine. Tetanus, diphtheria, and pertussis (Tdap) booster vaccine. Your health care provider may also recommend other immunizations. Tell your health care provider if you have ever been abused or do not feel safe at home. Summary Menopause is a normal process in which your ability to get pregnant comes to an end. This condition causes hot flashes, night sweats, decreased interest in sex, mood swings, headaches, or lack  of sleep. Treatment for this condition may include hormone replacement therapy. Take actions to keep yourself healthy, including exercising regularly, eating a healthy diet, watching your weight, and checking your blood pressure and blood sugar levels. Get screened for cancer and depression. Make sure that you are up to date with all your vaccines. This information is not intended to replace advice given to you by your health care provider. Make sure you discuss any questions you have with your health care provider. Document Revised: 07/11/2020 Document Reviewed: 07/11/2020 Elsevier Patient Education  2023 Elsevier Inc.  

## 2021-07-05 NOTE — Progress Notes (Signed)
? ?Subjective:  ? ? Patient ID: Veronica Chandler, female    DOB: 25-Dec-1970, 51 y.o.   MRN: [id number] ? ?HPI ? ?Patient presents to clinic today for her annual exam. ? ?Flu: 12/2020 ?Tetanus: 08/2017 ?COVID: Moderna x3 ?Pneumovax: 05/2020 ?Shingrix: Never ?Pap smear: 11/2015 ?Mammogram: 05/2020 ?Colon screening: 06/2020 ?Vision screening: as needed ?Dentist: as needed ? ?Diet: She does eat meat. She consumes fruits and veggies. She tries to avoid fried foods. She drinks mostly water, Dt. soda ?Exercise: None ? ? ? ? ?Review of Systems ? ?   ?Past Medical History:  ?Diagnosis Date  ? Medical history non-contributory   ? ? ?Current Outpatient Medications  ?Medication Sig Dispense Refill  ? acetaminophen (TYLENOL) 500 MG tablet Take 1,000 mg by mouth every 6 (six) hours as needed.    ? levocetirizine (XYZAL) 5 MG tablet TAKE 1 TABLET BY MOUTH EVERY DAY IN THE EVENING 90 tablet 1  ? Multiple Vitamin (MULTIVITAMIN) LIQD Take 5 mLs by mouth daily.    ? ?No current facility-administered medications for this visit.  ? ? ?No Known Allergies ? ?Family History  ?Problem Relation Age of Onset  ? Diabetes Father   ? Diabetes Paternal Grandmother   ? Breast cancer Neg Hx   ? ? ?Social History  ? ?Socioeconomic History  ? Marital status: Divorced  ?  Spouse name: Not on file  ? Number of children: Not on file  ? Years of education: Not on file  ? Highest education level: Not on file  ?Occupational History  ? Not on file  ?Tobacco Use  ? Smoking status: Never  ? Smokeless tobacco: Never  ?Vaping Use  ? Vaping Use: Never used  ?Substance and Sexual Activity  ? Alcohol use: Yes  ?  Comment: social  ? Drug use: Never  ? Sexual activity: Not on file  ?Other Topics Concern  ? Not on file  ?Social History Narrative  ? Not on file  ? ?Social Determinants of Health  ? ?Financial Resource Strain: Not on file  ?Food Insecurity: Not on file  ?Transportation Needs: Not on file  ?Physical Activity: Not on file  ?Stress: Not on file  ?Social  Connections: Not on file  ?Intimate Partner Violence: Not on file  ? ? ? ?Constitutional: Denies fever, malaise, fatigue, headache or abrupt weight changes.  ?HEENT: Denies eye pain, eye redness, ear pain, ringing in the ears, wax buildup, runny nose, nasal congestion, bloody nose, or sore throat. ?Respiratory: Denies difficulty breathing, shortness of breath, cough or sputum production.   ?Cardiovascular: Denies chest pain, chest tightness, palpitations or swelling in the hands or feet.  ?Gastrointestinal: Denies abdominal pain, bloating, constipation, diarrhea or blood in the stool.  ?GU: Denies urgency, frequency, pain with urination, burning sensation, blood in urine, odor or discharge. ?Musculoskeletal: Denies decrease in range of motion, difficulty with gait, muscle pain or joint pain and swelling.  ?Skin: Denies redness, rashes, lesions or ulcercations.  ?Neurological: Denies dizziness, difficulty with memory, difficulty with speech or problems with balance and coordination.  ?Psych: Denies anxiety, depression, SI/HI. ? ?No other specific complaints in a complete review of systems (except as listed in HPI above). ? ?Objective:  ? Physical Exam ? ? ?BP 132/80 (BP Location: Left Arm, Patient Position: Sitting, Cuff Size: Large)   Pulse 71   Temp (!) 97.3 ?F (36.3 ?C) (Temporal)   Ht '5\' 1"'  (1.549 m)   Wt 265 lb (120.2 kg)   SpO2 99%   BMI  50.07 kg/m?  ? ?Wt Readings from Last 3 Encounters:  ?12/21/20 269 lb (122 kg)  ?09/14/20 264 lb 3.2 oz (119.8 kg)  ?06/16/20 264 lb (119.7 kg)  ? ? ?General: Appears his stated age, obese, in NAD. ?Skin: Warm, dry and intact. No ulcerations noted. ?HEENT: Head: normal shape and size; Eyes: sclera white, no icterus, conjunctiva pink, PERRLA and EOMs intact;  ?Neck:  Neck supple, trachea midline. No masses, lumps or thyromegaly present.  ?Cardiovascular: Normal rate and rhythm. S1,S2 noted.  No murmur, rubs or gallops noted. No JVD or BLE edema. No carotid bruits  noted. ?Pulmonary/Chest: Normal effort and positive vesicular breath sounds. No respiratory distress. No wheezes, rales or ronchi noted.  ?Abdomen: Soft and nontender. Normal bowel sounds.  ?Musculoskeletal: Strength 5/5 BUE/BLE. No difficulty with gait.  ?Neurological: Alert and oriented. Cranial nerves II-XII grossly intact. Coordination normal.  ?Psychiatric: Mood and affect normal. Behavior is normal. Judgment and thought content normal.  ? ? ?BMET ?   ?Component Value Date/Time  ? NA 138 12/21/2020 1038  ? K 4.0 12/21/2020 1038  ? CL 101 12/21/2020 1038  ? CO2 30 12/21/2020 1038  ? GLUCOSE 120 (H) 12/21/2020 1038  ? BUN 10 12/21/2020 1038  ? CREATININE 0.85 12/21/2020 1038  ? CALCIUM 9.5 12/21/2020 1038  ? ? ?Lipid Panel  ?   ?Component Value Date/Time  ? CHOL 160 05/05/2020 1418  ? TRIG 75.0 05/05/2020 1418  ? HDL 59.60 05/05/2020 1418  ? CHOLHDL 3 05/05/2020 1418  ? VLDL 15.0 05/05/2020 1418  ? [place] 85 05/05/2020 1418  ? [place] 78 01/09/2019 1505  ? ? ?CBC ?   ?Component Value Date/Time  ? WBC 5.4 12/21/2020 1038  ? RBC 4.19 12/21/2020 1038  ? HGB 12.3 12/21/2020 1038  ? HCT 36.7 12/21/2020 1038  ? PLT 291 12/21/2020 1038  ? MCV 87.6 12/21/2020 1038  ? MCH 29.4 12/21/2020 1038  ? MCHC 33.5 12/21/2020 1038  ? RDW 14.3 12/21/2020 1038  ? ? ?Hgb A1C ?Lab Results  ?Component Value Date  ? HGBA1C 6.6 (H) 12/21/2020  ? ? ? ? ? ? ?   ?Assessment & Plan:  ? ?Preventative Health Maintenance: ? ?Encouraged her to get a flu shot in the fall ?Tetanus UTD ?Pneumovax UTD ?Encouraged her to get her COVID booster ?She declines pap smear today, will get at next vist ?Mammogram ordered-she will call to schedule ?Colon screening UTD ?Encouraged her to consume a balanced diet and exercise regimen ?Advised her to see an eye doctor and dentist annually ?We will check CBC, c-Met, lipid, A1c, urine microalbumin ? ? ?RTC in 6 months, follow-up chronic conditions ?[name], NP ? ?

## 2021-07-06 LAB — CBC
HCT: 37 % (ref 35.0–45.0)
Hemoglobin: 12.5 g/dL (ref 11.7–15.5)
MCH: 29.5 pg (ref 27.0–33.0)
MCHC: 33.8 g/dL (ref 32.0–36.0)
MCV: 87.3 fL (ref 80.0–100.0)
MPV: 9.2 fL (ref 7.5–12.5)
Platelets: 322 10*3/uL (ref 140–400)
RBC: 4.24 10*6/uL (ref 3.80–5.10)
RDW: 13.7 % (ref 11.0–15.0)
WBC: 5.2 10*3/uL (ref 3.8–10.8)

## 2021-07-06 LAB — COMPLETE METABOLIC PANEL WITH GFR
AG Ratio: 1.2 (calc) (ref 1.0–2.5)
ALT: 14 U/L (ref 6–29)
AST: 15 U/L (ref 10–35)
Albumin: 4.2 g/dL (ref 3.6–5.1)
Alkaline phosphatase (APISO): 74 U/L (ref 37–153)
BUN: 14 mg/dL (ref 7–25)
CO2: 27 mmol/L (ref 20–32)
Calcium: 9.6 mg/dL (ref 8.6–10.4)
Chloride: 102 mmol/L (ref 98–110)
Creat: 0.85 mg/dL (ref 0.50–1.03)
Globulin: 3.4 g/dL (calc) (ref 1.9–3.7)
Glucose, Bld: 112 mg/dL — ABNORMAL HIGH (ref 65–99)
Potassium: 4.4 mmol/L (ref 3.5–5.3)
Sodium: 139 mmol/L (ref 135–146)
Total Bilirubin: 0.4 mg/dL (ref 0.2–1.2)
Total Protein: 7.6 g/dL (ref 6.1–8.1)
eGFR: 83 mL/min/{1.73_m2} (ref >=60)

## 2021-07-06 LAB — HEMOGLOBIN A1C
Hgb A1c MFr Bld: 6.6 % of total Hgb — ABNORMAL HIGH (ref <5.7)
Mean Plasma Glucose: 143 mg/dL
eAG (mmol/L): 7.9 mmol/L

## 2021-07-06 LAB — MICROALBUMIN / CREATININE URINE RATIO
Creatinine, Urine: 66 mg/dL (ref 20–275)
Microalb Creat Ratio: 14 mcg/mg creat (ref <30)
Microalb, Ur: 0.9 mg/dL

## 2021-07-06 LAB — LIPID PANEL
Cholesterol: 174 mg/dL (ref <200)
HDL: 62 mg/dL (ref >=50)
LDL Cholesterol (Calc): 95 mg/dL (calc)
Non-HDL Cholesterol (Calc): 112 mg/dL (calc) (ref <130)
Total CHOL/HDL Ratio: 2.8 (calc) (ref <5.0)
Triglycerides: 81 mg/dL (ref <150)

## 2021-10-03 IMAGING — MG MM DIGITAL SCREENING BILAT W/ TOMO AND CAD
6 of 12 series · 6 of 36 positions shown · non-contrast
Comparison: Previous exam(s).

CLINICAL DATA: Screening.

EXAM:
DIGITAL SCREENING BILATERAL MAMMOGRAM WITH TOMOSYNTHESIS AND CAD
TECHNIQUE: Bilateral screening digital craniocaudal and mediolateral oblique
mammograms were obtained. Bilateral screening digital breast
tomosynthesis was performed. The images were evaluated with
computer-aided detection.

[L MLO synth-2D (1 of 2)]
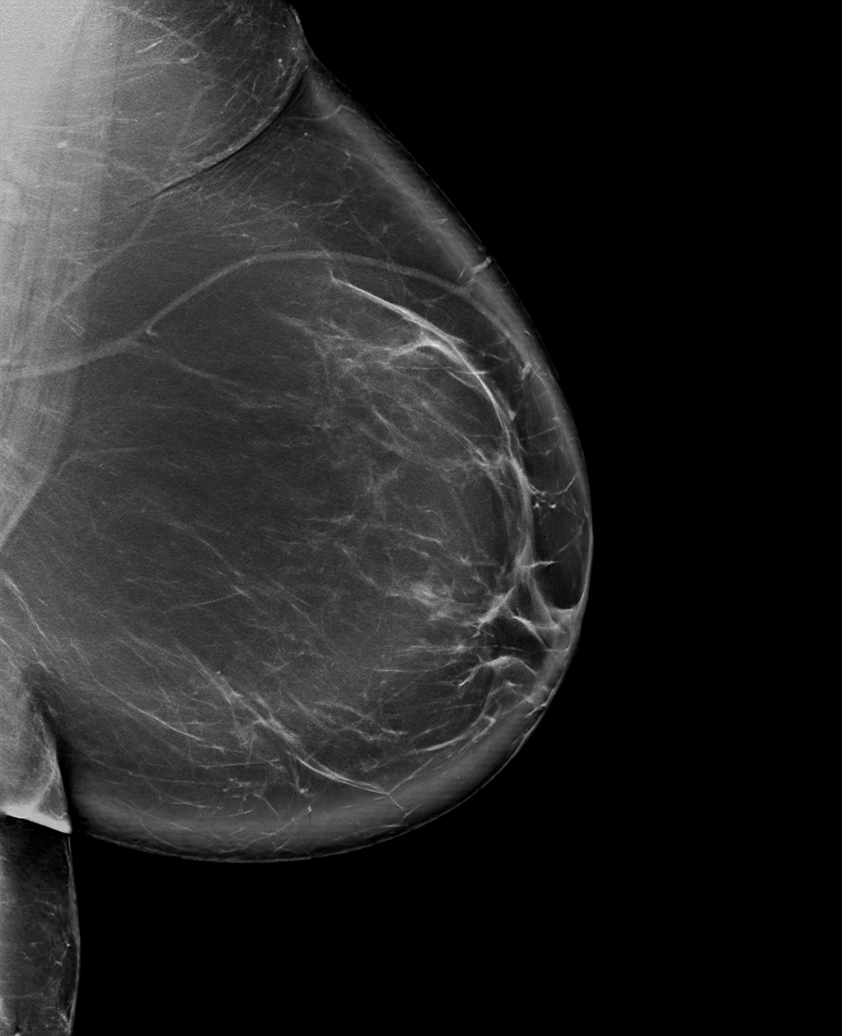

[R MLO synth-2D (1 of 2)]
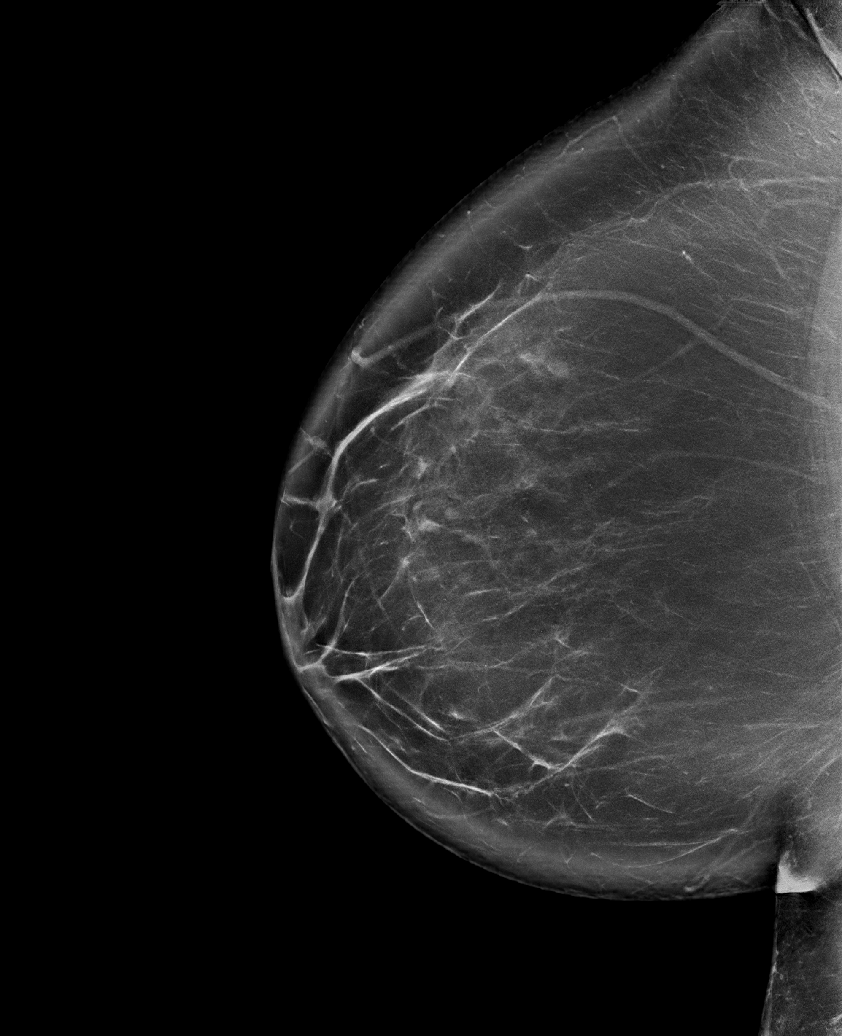

[L MLO synth-2D (2 of 2)]
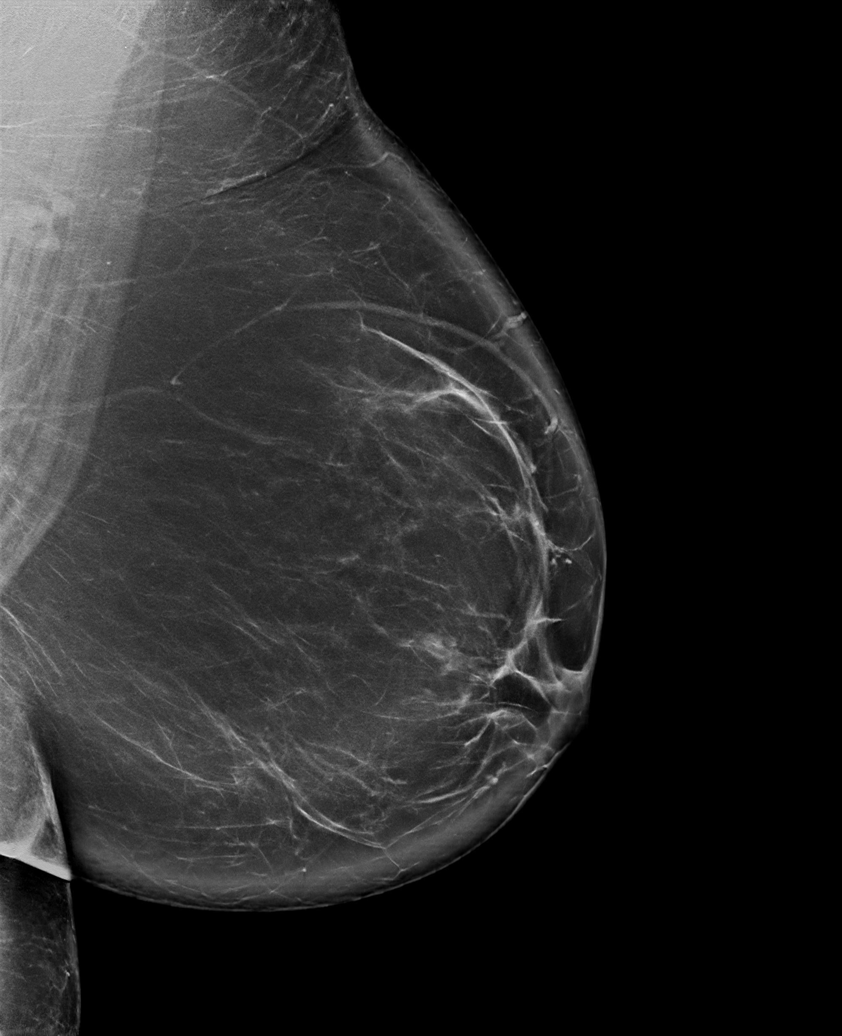

[R MLO synth-2D (2 of 2)]
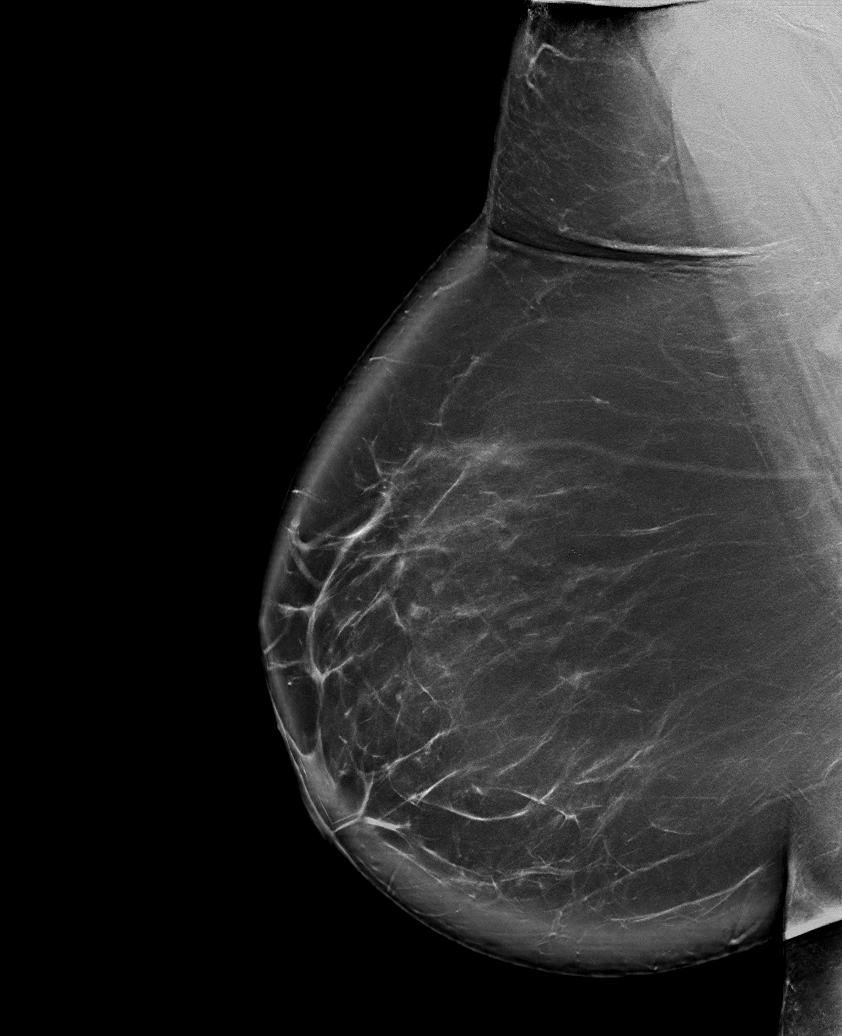

[R CC synth-2D]
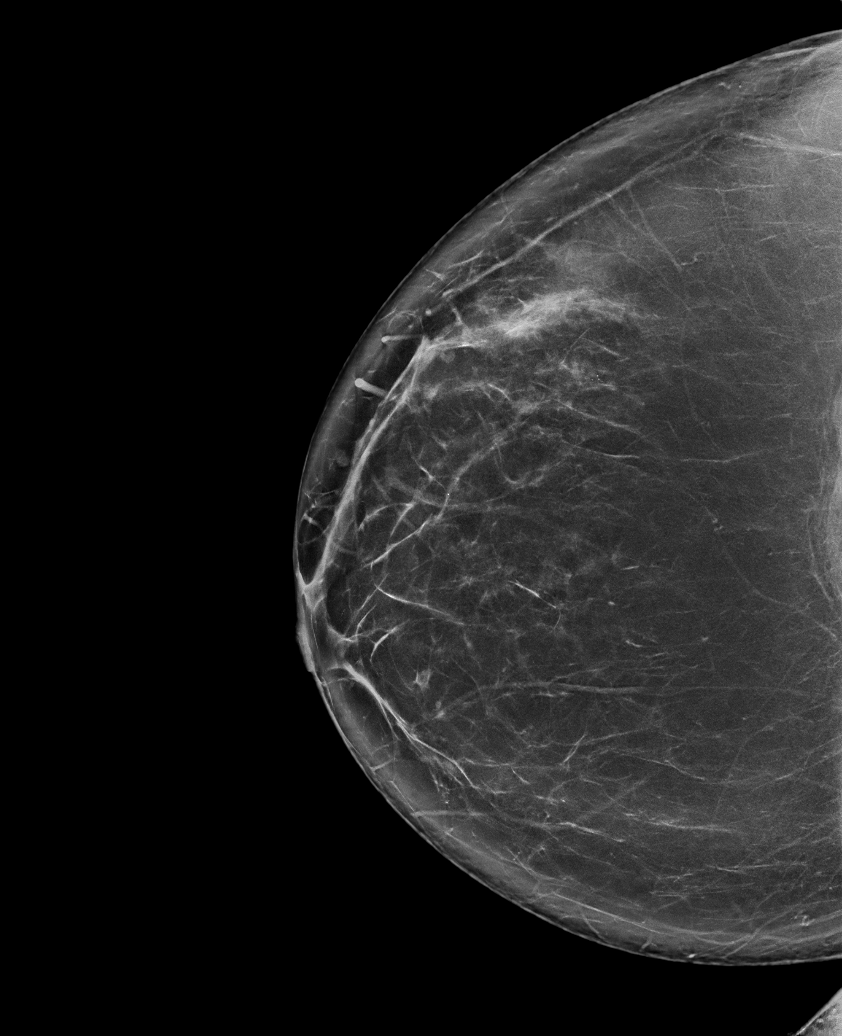

[L CC synth-2D]
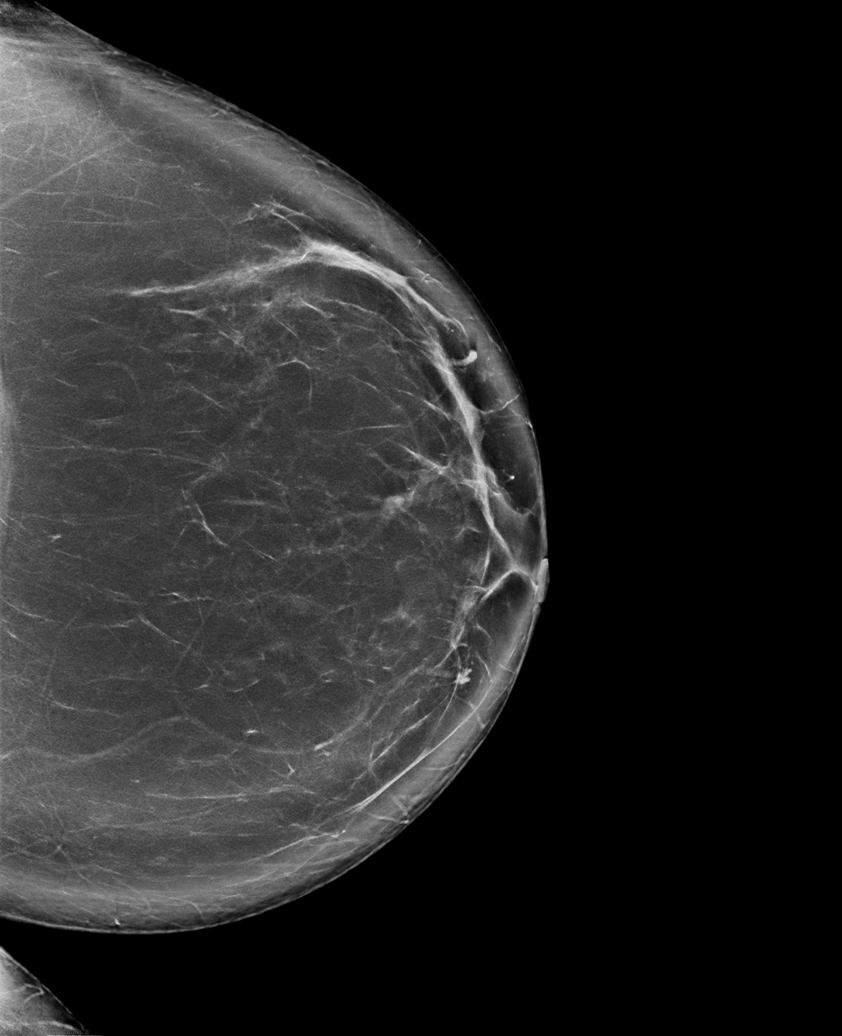

[6 of 36 positions shown; findings below may reference images not displayed]

ACR Breast Density Category b: There are scattered areas of
fibroglandular density.
FINDINGS: There are no findings suspicious for malignancy. The images were
evaluated with computer-aided detection.
IMPRESSION: No mammographic evidence of malignancy. A result letter of this
screening mammogram will be mailed directly to the patient.

RECOMMENDATION:
Screening mammogram in one year. (Code:WJ-[id number])

BI-RADS CATEGORY  1: Negative.

## 2021-11-13 ENCOUNTER — Other Ambulatory Visit: Payer: Self-pay | Admitting: Internal Medicine

## 2021-11-14 ENCOUNTER — Other Ambulatory Visit: Payer: Self-pay | Admitting: Internal Medicine

## 2021-11-14 MED ORDER — LEVOCETIRIZINE DIHYDROCHLORIDE 5 MG PO TABS: ORAL_TABLET | ORAL | 1 refills | Status: DC

## 2021-11-15 NOTE — Telephone Encounter (Signed)
Duplicate request. Requested Prescriptions  Pending Prescriptions Disp Refills  . levocetirizine (XYZAL) 5 MG tablet [Pharmacy Med Name: LEVOCETIRIZINE 5 MG TABLET] 30 tablet 5    Sig: TAKE 1 TABLET BY MOUTH EVERY DAY IN THE EVENING     Ear, Nose, and Throat:  Antihistamines - levocetirizine dihydrochloride Passed - 11/13/2021  4:14 PM      Passed - Cr in normal range and within 360 days    Creat  Date Value Ref Range Status  07/05/2021 0.85 0.50 - 1.03 mg/dL Final   Creatinine, Urine  Date Value Ref Range Status  07/05/2021 66 20 - 275 mg/dL Final         Passed - eGFR is 10 or above and within 360 days    GFR  Date Value Ref Range Status  05/05/2020 84.63 >60.00 mL/min Final    Comment:    Calculated using the CKD-EPI Creatinine Equation (2021)   eGFR  Date Value Ref Range Status  07/05/2021 83 > OR = 60 mL/min/1.48m Final    Comment:    The eGFR is based on the CKD-EPI 2021 equation. To calculate  the new eGFR from a previous Creatinine or Cystatin C result, go to https://www.kidney.org/professionals/ kdoqi/gfr%5Fcalculator          Passed - Valid encounter within last 12 months    Recent Outpatient Visits          4 months ago [facility] general [facility] medical [facility] with [facility]   10 months ago [facility]LQ abdominal [facility] [place]   1 year ago Type 2 diabetes mellitus with hyperglycemia, without long-term current use of insulin ([facility]      Future Appointments            In 1 week BGarnette Gunner [facility] NP [facility] [place]  In 1 month BAvery NP [facility] [place]

## 2021-11-28 ENCOUNTER — Encounter: Payer: Self-pay | Admitting: Internal Medicine

## 2021-11-28 ENCOUNTER — Ambulatory Visit: Payer: Commercial Managed Care - PPO | Admitting: Internal Medicine

## 2021-11-28 ENCOUNTER — Other Ambulatory Visit (HOSPITAL_COMMUNITY)
Admission: RE | Admit: 2021-11-28 | Discharge: 2021-11-28 | Disposition: A | Discharge status: Home / Self Care | Payer: Commercial Managed Care - PPO | Source: Ambulatory Visit | Attending: Internal Medicine | Admitting: Internal Medicine

## 2021-11-28 VITALS — BP 136/72 | HR 89 | Temp 97.9°F | Wt 274.0 lb

## 2021-11-28 DIAGNOSIS — Z01419 Encounter for gynecological examination (general) (routine) without abnormal findings: Secondary | ICD-10-CM | POA: Diagnosis not present

## 2021-11-28 DIAGNOSIS — Z124 Encounter for screening for malignant neoplasm of cervix: Secondary | ICD-10-CM | POA: Insufficient documentation

## 2021-11-28 MED ORDER — FLUTICASONE PROPIONATE 50 MCG/ACT NA SUSP: 2.0000 | Freq: Every day | NASAL | 1 refills | Status: DC

## 2021-11-28 NOTE — Progress Notes (Signed)
Subjective:    Patient ID: Veronica Chandler, female    DOB: 1971-01-25, 51 y.o.   MRN: [id number]  HPI  Patient presents to clinic today for her Pap smear.  She denies vaginal symptoms at this time.  She is sexually active.  She does still have regular periods.  Her last Pap smear was 10/2015.  Review of Systems     Past Medical History:  Diagnosis Date   Medical history non-contributory     Current Outpatient Medications  Medication Sig Dispense Refill   acetaminophen (TYLENOL) 500 MG tablet Take 1,000 mg by mouth every 6 (six) hours as needed.     levocetirizine (XYZAL) 5 MG tablet TAKE 1 TABLET BY MOUTH EVERY DAY IN THE EVENING 90 tablet 1   Multiple Vitamin (MULTIVITAMIN) LIQD Take 5 mLs by mouth daily.     No current facility-administered medications for this visit.    No Known Allergies  Family History  Problem Relation Age of Onset   Diabetes Father    Diabetes Paternal Grandmother    Lung cancer Maternal Aunt    Lung cancer Paternal Aunt    Breast cancer Neg Hx    Colon cancer Neg Hx    Ovarian cancer Neg Hx     Social History   Socioeconomic History   Marital status: Divorced    Spouse name: Not on file   Number of children: Not on file   Years of education: Not on file   Highest education level: Not on file  Occupational History   Not on file  Tobacco Use   Smoking status: Never   Smokeless tobacco: Never  Vaping Use   Vaping Use: Never used  Substance and Sexual Activity   Alcohol use: Yes    Comment: social   Drug use: Never   Sexual activity: Not on file  Other Topics Concern   Not on file  Social History Narrative   Not on file   Social Determinants of Health   Financial Resource Strain: Not on file  Food Insecurity: Not on file  Transportation Needs: Not on file  Physical Activity: Not on file  Stress: Not on file  Social Connections: Not on file  Intimate Partner Violence: Not on file     Constitutional: Denies fever,  malaise, fatigue, headache or abrupt weight changes.  Respiratory: Denies difficulty breathing, shortness of breath, cough or sputum production.   Cardiovascular: Denies chest pain, chest tightness, palpitations or swelling in the hands or feet.  Gastrointestinal: Denies abdominal pain, bloating, constipation, diarrhea or blood in the stool.  GU: Denies urgency, frequency, pain with urination, burning sensation, blood in urine, odor or discharge. Skin: Denies redness, rashes, lesions or ulcercations.   No other specific complaints in a complete review of systems (except as listed in HPI above).  Objective:   Physical Exam  BP 136/72 (BP Location: Right Arm, Patient Position: Sitting, Cuff Size: Large)   Pulse 89   Temp 97.9 F (36.6 C) (Temporal)   Wt 274 lb (124.3 kg)   SpO2 100%   BMI 51.77 kg/m   Wt Readings from Last 3 Encounters:  07/05/21 265 lb (120.2 kg)  12/21/20 269 lb (122 kg)  09/14/20 264 lb 3.2 oz (119.8 kg)    General: Appears her stated age, obese, in NAD. Skin: Warm, dry and intact. No rashes, lesions or ulcerations noted. Cardiovascular: Normal rate and rhythm.  Pulmonary/Chest: Normal effort and positive vesicular breath sounds. No respiratory distress. No wheezes,  rales or ronchi noted.  Pelvic: Normal female anatomy.  Cervix not visualized.  No CMT.  Adnexa nonpalpable.  Neurological: Alert and oriented.    BMET    Component Value Date/Time   NA 139 07/05/2021 1006   K 4.4 07/05/2021 1006   CL 102 07/05/2021 1006   CO2 27 07/05/2021 1006   GLUCOSE 112 (H) 07/05/2021 1006   BUN 14 07/05/2021 1006   CREATININE 0.85 07/05/2021 1006   CALCIUM 9.6 07/05/2021 1006    Lipid Panel     Component Value Date/Time   CHOL 174 07/05/2021 1006   TRIG 81 07/05/2021 1006   HDL 62 07/05/2021 1006   CHOLHDL 2.8 07/05/2021 1006   VLDL 15.0 05/05/2020 1418   LDLCALC 95 07/05/2021 1006    CBC    Component Value Date/Time   WBC 5.2 07/05/2021 1006   RBC  4.24 07/05/2021 1006   HGB 12.5 07/05/2021 1006   HCT 37.0 07/05/2021 1006   PLT 322 07/05/2021 1006   MCV 87.3 07/05/2021 1006   MCH 29.5 07/05/2021 1006   MCHC 33.8 07/05/2021 1006   RDW 13.7 07/05/2021 1006    Hgb A1C Lab Results  Component Value Date   HGBA1C 6.6 (H) 07/05/2021           Assessment & Plan:  Screen for Cervical Cancer with routine GYN exam:  Pap smear today with STD screening Discussed the possibility of pregnancy given that she is still having regular periods however she does not want to start hormonal therapy at this time   RTC in 2 months for follow-up of chronic conditions [name], NP

## 2021-11-28 NOTE — Addendum Note (Signed)
Addended by: Ashley Royalty E on: 11/28/2021 09:25 AM   Modules accepted: Orders

## 2021-11-28 NOTE — Patient Instructions (Signed)
Pap Test Why am I having this test? A Pap test, also called a Pap smear, is a screening test to check for signs of: Infection. Cancer of the cervix. The cervix is the lower part of the uterus that opens into the vagina. Changes that may be a sign that cancer is developing (precancerous changes). Women need this test on a regular basis. In general, you should have a Pap test every 3 years until you reach menopause or age 52. Women aged 30-60 may choose to have their Pap test done at the same time as an HPV (human papillomavirus) test every 5 years (instead of every 3 years). Your health care provider may recommend having Pap tests more or less often depending on your medical conditions and past Pap test results. What is being tested? Cervical cells are tested for signs of infection or abnormalities. What kind of sample is taken?  Your health care provider will collect a sample of cells from the surface of your cervix. This will be done using a small cotton swab, plastic spatula, or brush that is inserted into your vagina using a tool called a speculum. This sample is often collected during a pelvic exam, when you are lying on your back on an exam table with your feet in footrests (stirrups). In some cases, fluids (secretions) from the cervix or vagina may also be collected. How do I prepare for this test? Be aware of where you are in your menstrual cycle. If you are menstruating on the day of the test, you may be asked to reschedule. You may need to reschedule if you have a known vaginal infection on the day of the test. Follow instructions from your health care provider about: Changing or stopping your regular medicines. Some medicines can cause abnormal test results, such as vaginal medicines and tetracycline. Avoiding douching 2-3 days before or the day of the test. Tell a health care provider about: Any allergies you have. All medicines you are taking, including vitamins, herbs, eye drops,  creams, and over-the-counter medicines. Any bleeding problems you have. Any surgeries you have had. Any medical conditions you have. Whether you are pregnant or may be pregnant. How are the results reported? Your test results will be reported as either abnormal or normal. What do the results mean? A normal test result means that you do not have signs of cancer of the cervix. An abnormal result may mean that you have: Cancer. A Pap test by itself is not enough to diagnose cancer. You will have more tests done if cancer is suspected. Precancerous changes in your cervix. Inflammation of the cervix. An STI (sexually transmitted infection). A fungal infection. A parasite infection. Talk with your health care provider about what your results mean. In some cases, your health care provider may do more testing to confirm the results. Questions to ask your health care provider Ask your health care provider, or the department that is doing the test: When will my results be ready? How will I get my results? What are my treatment options? What other tests do I need? What are my next steps? Summary In general, women should have a Pap test every 3 years until they reach menopause or age 65. Your health care provider will collect a sample of cells from the surface of your cervix. This will be done using a small cotton swab, plastic spatula, or brush. In some cases, fluids (secretions) from the cervix or vagina may also be collected. This information is not  intended to replace advice given to you by your health care provider. Make sure you discuss any questions you have with your health care provider. Document Revised: 05/20/2020 Document Reviewed: 05/20/2020 Elsevier Patient Education  2023 [employer] Inc.  

## 2021-12-04 LAB — CYTOLOGY - PAP
Chlamydia: NEGATIVE
Comment: NEGATIVE
Comment: NEGATIVE
Comment: NEGATIVE
Comment: NORMAL
Diagnosis: NEGATIVE
High risk HPV: NEGATIVE
Neisseria Gonorrhea: NEGATIVE
Trichomonas: NEGATIVE

## 2021-12-06 ENCOUNTER — Ambulatory Visit
Admission: RE | Admit: 2021-12-06 | Discharge: 2021-12-06 | Disposition: A | Discharge status: Home / Self Care | Payer: Commercial Managed Care - PPO | Source: Ambulatory Visit | Attending: Internal Medicine | Admitting: Internal Medicine

## 2021-12-06 DIAGNOSIS — Z1231 Encounter for screening mammogram for malignant neoplasm of breast: Secondary | ICD-10-CM | POA: Diagnosis not present

## 2021-12-07 ENCOUNTER — Other Ambulatory Visit: Payer: Self-pay | Admitting: Internal Medicine

## 2021-12-07 DIAGNOSIS — R921 Mammographic calcification found on diagnostic imaging of breast: Secondary | ICD-10-CM

## 2021-12-07 DIAGNOSIS — R928 Other abnormal and inconclusive findings on diagnostic imaging of breast: Secondary | ICD-10-CM

## 2022-01-09 ENCOUNTER — Ambulatory Visit: Payer: Commercial Managed Care - PPO | Admitting: Internal Medicine

## 2022-01-11 ENCOUNTER — Ambulatory Visit
Admission: RE | Admit: 2022-01-11 | Discharge: 2022-01-11 | Disposition: A | Discharge status: Home / Self Care | Payer: Commercial Managed Care - PPO | Source: Ambulatory Visit | Attending: Internal Medicine | Admitting: Internal Medicine

## 2022-01-11 ENCOUNTER — Other Ambulatory Visit: Payer: Self-pay | Admitting: Internal Medicine

## 2022-01-11 DIAGNOSIS — R928 Other abnormal and inconclusive findings on diagnostic imaging of breast: Secondary | ICD-10-CM | POA: Insufficient documentation

## 2022-01-11 DIAGNOSIS — R921 Mammographic calcification found on diagnostic imaging of breast: Secondary | ICD-10-CM | POA: Diagnosis present

## 2022-02-01 ENCOUNTER — Other Ambulatory Visit: Payer: Self-pay | Admitting: Internal Medicine

## 2022-02-01 ENCOUNTER — Ambulatory Visit
Admission: RE | Admit: 2022-02-01 | Discharge: 2022-02-01 | Disposition: A | Discharge status: Home / Self Care | Payer: Commercial Managed Care - PPO | Source: Ambulatory Visit | Attending: Internal Medicine | Admitting: Internal Medicine

## 2022-02-01 DIAGNOSIS — R921 Mammographic calcification found on diagnostic imaging of breast: Secondary | ICD-10-CM

## 2022-02-01 DIAGNOSIS — R928 Other abnormal and inconclusive findings on diagnostic imaging of breast: Secondary | ICD-10-CM

## 2022-02-01 DIAGNOSIS — D0511 Intraductal carcinoma in situ of right breast: Secondary | ICD-10-CM | POA: Diagnosis not present

## 2022-02-01 HISTORY — PX: BREAST BIOPSY: SHX20

## 2022-02-01 MED ORDER — LIDOCAINE-EPINEPHRINE 1 %-1:100000 IJ SOLN
10.0000 mL | Freq: Once | INTRAMUSCULAR | Status: AC
  Administered 2022-02-01: 10 mL
  Filled 2022-02-01: qty 10

## 2022-02-01 MED ORDER — LIDOCAINE HCL (PF) 1 % IJ SOLN
5.0000 mL | Freq: Once | INTRAMUSCULAR | Status: AC
  Administered 2022-02-01: 5 mL
  Filled 2022-02-01: qty 5

## 2022-02-01 NOTE — Telephone Encounter (Signed)
Requested Prescriptions  Pending Prescriptions Disp Refills   fluticasone (FLONASE) 50 MCG/ACT nasal spray [Pharmacy Med Name: FLUTICASONE PROP 50 MCG SPRAY] 16 mL 1    Sig: SPRAY 2 SPRAYS INTO EACH NOSTRIL EVERY DAY     Ear, Nose, and Throat: Nasal Preparations - Corticosteroids Passed - 02/01/2022 10:12 AM      Passed - Valid encounter within last 12 months    Recent Outpatient Visits           2 months ago Screening for cervical cancer   [facility] [place], Coralie Keens, NP   7 months ago Encounter for general adult medical examination with abnormal findings   [facility] [place], Coralie Keens, NP   1 year ago RLQ abdominal pain   [facility] [place], NP   1 year ago Type 2 diabetes mellitus with hyperglycemia, without long-term current use of insulin [facility])   [facility] [place], Coralie Keens, NP       Future Appointments             In 1 week Garnette Gunner, Coralie Keens, NP [facility]

## 2022-02-02 ENCOUNTER — Encounter: Payer: Self-pay | Admitting: *Deleted

## 2022-02-02 DIAGNOSIS — D0511 Intraductal carcinoma in situ of right breast: Secondary | ICD-10-CM

## 2022-02-02 LAB — SURGICAL PATHOLOGY

## 2022-02-02 NOTE — Progress Notes (Signed)
Received referral for newly diagnosed breast cancer from [facility].  Navigation initiated.  Med Onc and Surgical consults scheduled.  She will see Dr. Tasia Catchings on Monday and Dr. Lysle Pearl on Tuesday.

## 2022-02-05 ENCOUNTER — Encounter: Payer: Self-pay | Admitting: *Deleted

## 2022-02-05 ENCOUNTER — Encounter: Payer: Self-pay | Admitting: Oncology

## 2022-02-05 ENCOUNTER — Inpatient Hospital Stay: Payer: Commercial Managed Care - PPO

## 2022-02-05 ENCOUNTER — Inpatient Hospital Stay: Payer: Commercial Managed Care - PPO | Attending: Oncology | Admitting: Oncology

## 2022-02-05 VITALS — BP 163/81 | HR 94 | Temp 98.8°F | Resp 18 | Wt 276.6 lb

## 2022-02-05 DIAGNOSIS — Z833 Family history of diabetes mellitus: Secondary | ICD-10-CM | POA: Insufficient documentation

## 2022-02-05 DIAGNOSIS — D0510 Intraductal carcinoma in situ of unspecified breast: Secondary | ICD-10-CM | POA: Insufficient documentation

## 2022-02-05 DIAGNOSIS — Z809 Family history of malignant neoplasm, unspecified: Secondary | ICD-10-CM | POA: Insufficient documentation

## 2022-02-05 DIAGNOSIS — Z7189 Other specified counseling: Secondary | ICD-10-CM | POA: Diagnosis not present

## 2022-02-05 DIAGNOSIS — Z801 Family history of malignant neoplasm of trachea, bronchus and lung: Secondary | ICD-10-CM | POA: Insufficient documentation

## 2022-02-05 DIAGNOSIS — Z800 Family history of malignant neoplasm of digestive organs: Secondary | ICD-10-CM | POA: Insufficient documentation

## 2022-02-05 DIAGNOSIS — D0511 Intraductal carcinoma in situ of right breast: Secondary | ICD-10-CM | POA: Insufficient documentation

## 2022-02-05 DIAGNOSIS — D0590 Unspecified type of carcinoma in situ of unspecified breast: Secondary | ICD-10-CM | POA: Insufficient documentation

## 2022-02-05 DIAGNOSIS — Z8042 Family history of malignant neoplasm of prostate: Secondary | ICD-10-CM | POA: Insufficient documentation

## 2022-02-05 DIAGNOSIS — Z853 Personal history of malignant neoplasm of breast: Secondary | ICD-10-CM | POA: Insufficient documentation

## 2022-02-05 LAB — COMPREHENSIVE METABOLIC PANEL
ALT: 16 U/L (ref 0–44)
AST: 17 U/L (ref 15–41)
Albumin: 3.9 g/dL (ref 3.5–5.0)
Alkaline Phosphatase: 76 U/L (ref 38–126)
Anion gap: 8 (ref 5–15)
BUN: 9 mg/dL (ref 6–20)
CO2: 26 mmol/L (ref 22–32)
Calcium: 8.8 mg/dL — ABNORMAL LOW (ref 8.9–10.3)
Chloride: 103 mmol/L (ref 98–111)
Creatinine, Ser: 0.88 mg/dL (ref 0.44–1.00)
GFR, Estimated: >60 mL/min (ref >60)
Glucose, Bld: 164 mg/dL — ABNORMAL HIGH (ref 70–99)
Potassium: 3.8 mmol/L (ref 3.5–5.1)
Sodium: 137 mmol/L (ref 135–145)
Total Bilirubin: 0.3 mg/dL (ref 0.3–1.2)
Total Protein: 7.9 g/dL (ref 6.5–8.1)

## 2022-02-05 LAB — CBC WITH DIFFERENTIAL/PLATELET
Abs Immature Granulocytes: 0.01 10*3/uL (ref 0.00–0.07)
Basophils Absolute: 0 10*3/uL (ref 0.0–0.1)
Basophils Relative: 1 %
Eosinophils Absolute: 0.2 10*3/uL (ref 0.0–0.5)
Eosinophils Relative: 3 %
HCT: 36.6 % (ref 36.0–46.0)
Hemoglobin: 12.8 g/dL (ref 12.0–15.0)
Immature Granulocytes: 0 %
Lymphocytes Relative: 38 %
Lymphs Abs: 2.5 10*3/uL (ref 0.7–4.0)
MCH: 29.8 pg (ref 26.0–34.0)
MCHC: 35 g/dL (ref 30.0–36.0)
MCV: 85.3 fL (ref 80.0–100.0)
Monocytes Absolute: 0.4 10*3/uL (ref 0.1–1.0)
Monocytes Relative: 6 %
Neutro Abs: 3.5 10*3/uL (ref 1.7–7.7)
Neutrophils Relative %: 52 %
Platelets: 312 10*3/uL (ref 150–400)
RBC: 4.29 MIL/uL (ref 3.87–5.11)
RDW: 13.3 % (ref 11.5–15.5)
WBC: 6.7 10*3/uL (ref 4.0–10.5)
nRBC: 0 % (ref 0.0–0.2)

## 2022-02-05 NOTE — Assessment & Plan Note (Signed)
The DCIS diagnosis and care plan were discussed with patient in detail.  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 Recommend lumpectomy.  DCIS no need for sentinel lymph node biopsy.  Discussed about 10% chance of invasive component found on lumpectomy specimen, if that is the case, patient will need sentinel LN biopsy.  Discussed about adjuvant radiation and possible anti estrogen therapy.

## 2022-02-05 NOTE — Progress Notes (Signed)
Hematology/Oncology Consult Note [phone] [phone]     REFERRING PROVIDER: Jearld Fenton, NP   Patient Care Team: Jearld Fenton, NP as PCP - General (Internal Medicine) Daiva Huge, RN as Oncology Nurse Navigator   ASSESSMENT & PLAN:   Cancer Staging  DCIS (ductal carcinoma in situ) Staging form: Breast, AJCC 8th Edition - Clinical stage from 02/05/2022: Stage 0 (cTis (Paget), cN0, cM0, ER: Not Assessed, PR: Not Assessed, HER2: Not Assessed) - Signed by Earlie Server, MD on 02/05/2022   DCIS (ductal carcinoma in situ) The DCIS diagnosis and care plan were discussed with patient in detail.  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 Recommend lumpectomy.  DCIS no need for sentinel lymph node biopsy.  Discussed about 10% chance of invasive component found on lumpectomy specimen, if that is the case, patient will need sentinel LN biopsy.  Discussed about adjuvant radiation and possible anti estrogen therapy.    Goals of care, counseling/discussion Discussed with patient. Curative intent.   Family history of cancer Recommend genetic testing.  She declines.    Orders Placed This Encounter  Procedures   CBC with Differential/Platelet    Standing Status:   Future    Number of Occurrences:   1    Standing Expiration Date:   02/06/2023   Comprehensive metabolic panel    Standing Status:   Future    Number of Occurrences:   1    Standing Expiration Date:   02/05/2023    All questions were answered. The patient knows to call the clinic with any problems, questions or concerns.  Earlie Server, MD, PhD [facility] Hematology Oncology 02/05/2022   CHIEF COMPLAINTS/PURPOSE OF CONSULTATION:  Right breast DCIS  HISTORY OF PRESENTING ILLNESS:  Veronica Chandler 51 y.o. 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 Summary of oncologic history is as follows: Oncology History  DCIS (ductal carcinoma in situ)  12/07/2021 Mammogram   Bilateral screen mammogram showed  In the right breast, calcifications warrant further evaluation with magnified views. In the left breast, no findings suspicious for malignancy   01/11/2022 Imaging   Unilateral right diagnostic mammogram showed . A 17 mm group of calcifications in the upper outer RIGHT breast at middle depth are indeterminate. Recommend stereotactic guided biopsy for definitive characterization.    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  Pathology showed high grade DCIS, comedo type. Calcifications associated with DCIS.     02/05/2022 Cancer Staging   Staging form: Breast, AJCC 8th Edition - Clinical stage from 02/05/2022: Stage 0 (cTis (Paget), cN0, cM0, ER: Not Assessed, PR: Not Assessed, HER2: Not Assessed) - Signed by Earlie Server, MD on 02/05/2022 Stage prefix: Initial diagnosis Nuclear grade: G3    Menarche at 51 yo Age at first child born 30 yo No previous OCP use No previous chest radiation, or breast biopsy.  Family history positive for father with prostate cancer, paternal aunt with lung cancer.    MEDICAL HISTORY:  Past Medical History:  Diagnosis Date   Medical history non-contributory     SURGICAL HISTORY: Past Surgical History:  Procedure Laterality Date   BREAST BIOPSY Right 02/01/2022   stereo  bx, calcs, "X" clip-path pending   BREAST BIOPSY Right 02/01/2022   MM RT BREAST BX W LOC DEV 1ST LESION IMAGE BX SPEC STEREO GUIDE 02/01/2022 [facility]   COLONOSCOPY WITH PROPOFOL N/A 06/16/2020   Procedure: COLONOSCOPY WITH PROPOFOL;  Surgeon: Jonathon Bellows, MD;  Location: [facility] ENDOSCOPY;  Service: Gastroenterology;  Laterality: N/A;   NO PAST SURGERIES      SOCIAL  HISTORY: Social History   Socioeconomic History   Marital status: Divorced    Spouse name: Not on file   Number of children: Not on file   Years of education: Not on file   Highest education level: Not on file  Occupational History   Not on file  Tobacco Use   Smoking status: Never   Smokeless tobacco: Never  Vaping Use   Vaping Use: Never used  Substance and Sexual Activity   Alcohol use: Yes    Comment: social   Drug use: Yes    Types: Marijuana    Comment: occasional   Sexual activity: Not on file  Other Topics Concern   Not on file  Social History Narrative   Not on file   Social Determinants of Health   Financial Resource Strain: Not on file  Food Insecurity: Not on file  Transportation Needs: Not on file  Physical Activity: Not on file  Stress: Not on file  Social Connections: Not on file  Intimate Partner Violence: Not on file    FAMILY HISTORY: Family History  Problem Relation Age of Onset   Diabetes Father    Prostate cancer Father    Lung cancer Maternal Aunt    Pancreatic cancer Maternal Aunt    Lung cancer Paternal Aunt    Diabetes Paternal Grandmother    Breast cancer Neg Hx    Colon cancer Neg Hx    Ovarian cancer Neg Hx     ALLERGIES:  has No Known Allergies.  MEDICATIONS:  Current Outpatient Medications  Medication Sig Dispense Refill   acetaminophen (TYLENOL) 500 MG tablet Take 1,000 mg by mouth every 6 (six) hours as needed.     fluticasone (FLONASE) 50 MCG/ACT nasal spray SPRAY 2 SPRAYS INTO EACH NOSTRIL EVERY DAY 16 mL 1   levocetirizine (XYZAL) 5 MG tablet TAKE 1 TABLET BY MOUTH EVERY DAY IN THE EVENING 90 tablet 1   Multiple Vitamin (MULTIVITAMIN) LIQD Take 5 mLs by mouth daily.     No current facility-administered medications for this visit.    Review of Systems  Constitutional:  Negative for appetite change, chills, fatigue and fever.  HENT:   Negative for hearing loss and voice change.   Eyes:  Negative for eye problems.   Respiratory:  Negative for chest tightness and cough.   Cardiovascular:  Negative for chest pain.  Gastrointestinal:  Negative for abdominal distention, abdominal pain and blood in stool.  Endocrine: Negative for hot flashes.  Genitourinary:  Negative for difficulty urinating and frequency.   Musculoskeletal:  Negative for arthralgias.  Skin:  Negative for itching and rash.  Neurological:  Negative for extremity weakness.  Hematological:  Negative for adenopathy.  Psychiatric/Behavioral:  Negative for confusion.      PHYSICAL EXAMINATION: ECOG PERFORMANCE STATUS: 0 - Asymptomatic  Vitals:   02/05/22 1445  BP: (!) 163/81  Pulse: 94  Resp: 18  Temp: 98.8 F (37.1 C)   Filed Weights   02/05/22 1445  Weight: 276 lb 9.6 oz (125.5 kg)    Physical Exam Constitutional:  General: She is not in acute distress.    Appearance: She is not diaphoretic.  HENT:     Head: Normocephalic and atraumatic.     Nose: Nose normal.     Mouth/Throat:     Pharynx: No oropharyngeal exudate.  Eyes:     General: No scleral icterus.    Pupils: Pupils are equal, round, and reactive to light.  Cardiovascular:     Rate and Rhythm: Normal rate and regular rhythm.     Heart sounds: No murmur heard. Pulmonary:     Effort: Pulmonary effort is normal. No respiratory distress.     Breath sounds: No rales.  Chest:     Chest wall: No tenderness.  Abdominal:     General: There is no distension.     Palpations: Abdomen is soft.     Tenderness: There is no abdominal tenderness.  Musculoskeletal:        General: Normal range of motion.     Cervical back: Normal range of motion and neck supple.  Skin:    General: Skin is warm and dry.     Findings: No erythema.  Neurological:     Mental Status: She is alert and oriented to person, place, and time.     Cranial Nerves: No cranial nerve deficit.     Motor: No abnormal muscle tone.     Coordination: Coordination normal.  Psychiatric:        Mood  and Affect: Mood and affect normal.      LABORATORY DATA:  I have reviewed the data as listed    Latest Ref Rng & Units 02/05/2022    3:49 PM 07/05/2021   10:06 AM 12/21/2020   10:38 AM  CBC  WBC 4.0 - 10.5 K/uL 6.7  5.2  5.4   Hemoglobin 12.0 - 15.0 g/dL 12.8  12.5  12.3   Hematocrit 36.0 - 46.0 % 36.6  37.0  36.7   Platelets 150 - 400 K/uL 312  322  291       Latest Ref Rng & Units 02/05/2022    3:49 PM 07/05/2021   10:06 AM 12/21/2020   10:38 AM  CMP  Glucose 70 - 99 mg/dL 164  112  120   BUN 6 - 20 mg/dL _0 Creatinine 0.44 - 1.00 mg/dL 0.88  0.85  0.85   Sodium 135 - 145 mmol/L 137  139  138   Potassium 3.5 - 5.1 mmol/L 3.8  4.4  4.0   Chloride 98 - 111 mmol/L 103  102  101   CO2 22 - 32 mmol/L _1 Calcium 8.9 - 10.3 mg/dL 8.8  9.6  9.5   Total Protein 6.5 - 8.1 g/dL 7.9  7.6  7.2   Total Bilirubin 0.3 - 1.2 mg/dL 0.3  0.4  0.4   Alkaline Phos 38 - 126 U/L 76     AST 15 - 41 U/L _2 ALT 0 - 44 U/L _3 RADIOGRAPHIC STUDIES: I have personally reviewed the radiological images as listed and agreed with the findings in the report. MM CLIP PLACEMENT RIGHT  Result Date: 02/01/2022 CLINICAL DATA:  Status post stereo biopsy right breast calcifications. EXAM: 3D DIAGNOSTIC RIGHT MAMMOGRAM POST STEREOTACTIC BIOPSY COMPARISON:  Previous exam(s). FINDINGS: 3D Mammographic images were obtained following stereotactic guided biopsy of right breast calcifications. The biopsy marking  clip is in expected position at the site of biopsy. IMPRESSION: Appropriate positioning of the X shaped biopsy marking clip at the site of biopsy in the upper-outer right breast. Final Assessment: Post Procedure Mammograms for Marker Placement Electronically Signed   By: Lovey Newcomer M.D.   On: 02/01/2022 09:48  MM RT BREAST BX W LOC DEV 1ST LESION IMAGE BX SPEC STEREO GUIDE  Result Date: 02/01/2022 CLINICAL DATA:  Suspicious right breast calcifications. EXAM: RIGHT  BREAST STEREOTACTIC CORE NEEDLE BIOPSY COMPARISON:  Previous exam(s). FINDINGS: The patient and I discussed the procedure of stereotactic-guided biopsy including benefits and alternatives. We discussed the high likelihood of a successful procedure. We discussed the risks of the procedure including infection, bleeding, tissue injury, clip migration, and inadequate sampling. Informed written consent was given. The usual time out protocol was performed immediately prior to the procedure. Using sterile technique and 1% Lidocaine as local anesthetic, under stereotactic guidance, a 9 gauge vacuum assisted device was used to perform core needle biopsy of calcifications upper-outer right breast middle depth using a lateral approach. Specimen radiograph was performed showing calcifications. Specimens with calcifications are identified for pathology. Lesion quadrant: Upper outer quadrant At the conclusion of the procedure, X shaped tissue marker clip was deployed into the biopsy cavity. Follow-up 2-view mammogram was performed and dictated separately. IMPRESSION: Stereotactic-guided biopsy of right breast calcifications. No apparent complications. Electronically Signed   By: Lovey Newcomer M.D.   On: 02/01/2022 09:45  MM DIAG BREAST TOMO UNI RIGHT  Result Date: 01/11/2022 CLINICAL DATA:  Callback for RIGHT breast calcifications. EXAM: DIGITAL DIAGNOSTIC UNILATERAL RIGHT MAMMOGRAM WITH TOMOSYNTHESIS TECHNIQUE: Right digital diagnostic mammography and breast tomosynthesis was performed. COMPARISON:  Previous exam(s). ACR Breast Density Category b: There are scattered areas of fibroglandular density. FINDINGS: Spot magnification views of the RIGHT breast demonstrate a 17 mm group of amorphous and punctate calcifications in the RIGHT outer slightly upper breast at middle depth. These are not definitively stable in comparison to prior mammograms. No additional suspicious findings are noted. IMPRESSION: 1. A 17 mm group of  calcifications in the upper outer RIGHT breast at middle depth are indeterminate. Recommend stereotactic guided biopsy for definitive characterization. RECOMMENDATION: RIGHT breast stereotactic guided biopsy x1 I have discussed the findings and recommendations with the patient. The biopsy procedure was discussed with the patient and questions were answered. Patient expressed their understanding of the biopsy recommendation. Patient will be scheduled for biopsy at her earliest convenience by the schedulers. Ordering provider will be notified. If applicable, a reminder letter will be sent to the patient regarding the next appointment. BI-RADS CATEGORY  4: Suspicious. Electronically Signed   By: Valentino Saxon M.D.   On: 01/11/2022 10:12

## 2022-02-05 NOTE — Progress Notes (Signed)
Accompanied patient and friend to initial medical oncology appointment.   Reviewed Breast Cancer treatment handbook.   Care plan summary given to patient.   Reviewed outreach programs and cancer center services.

## 2022-02-05 NOTE — Assessment & Plan Note (Signed)
Discussed with patient. Curative intent.

## 2022-02-05 NOTE — Assessment & Plan Note (Signed)
Recommend genetic testing.  She declines.

## 2022-02-06 ENCOUNTER — Ambulatory Visit: Payer: Self-pay | Admitting: Surgery

## 2022-02-08 ENCOUNTER — Other Ambulatory Visit: Payer: Self-pay | Admitting: General Surgery

## 2022-02-08 ENCOUNTER — Other Ambulatory Visit: Payer: Self-pay | Admitting: Surgery

## 2022-02-08 DIAGNOSIS — D0511 Intraductal carcinoma in situ of right breast: Secondary | ICD-10-CM

## 2022-02-14 ENCOUNTER — Ambulatory Visit: Payer: Commercial Managed Care - PPO | Admitting: Internal Medicine

## 2022-02-14 ENCOUNTER — Ambulatory Visit
Admission: RE | Admit: 2022-02-14 | Discharge: 2022-02-14 | Disposition: A | Discharge status: Home / Self Care | Payer: Commercial Managed Care - PPO | Source: Ambulatory Visit | Attending: General Surgery | Admitting: General Surgery

## 2022-02-14 DIAGNOSIS — Z86008 Personal history of in-situ neoplasm of other site: Secondary | ICD-10-CM | POA: Diagnosis not present

## 2022-02-14 DIAGNOSIS — D0511 Intraductal carcinoma in situ of right breast: Secondary | ICD-10-CM

## 2022-02-14 HISTORY — PX: BREAST BIOPSY: SHX20

## 2022-02-14 MED ORDER — CHLOROPROCAINE HCL (PF) 2 % IJ SOLN: 10.0000 mL | Freq: Once | INTRAMUSCULAR | Status: DC

## 2022-02-14 MED ORDER — CHLOROPROCAINE HCL 2 % IJ SOLN
10.0000 mL | Freq: Once | INTRAMUSCULAR | Status: DC
  Administered 2022-02-14: 10 mL

## 2022-02-14 MED ORDER — LIDOCAINE HCL (PF) 1 % IJ SOLN
10.0000 mL | Freq: Once | INTRAMUSCULAR | Status: AC
  Administered 2022-02-14: 10 mL
  Filled 2022-02-14: qty 10

## 2022-02-23 ENCOUNTER — Encounter
Admission: RE | Admit: 2022-02-23 | Discharge: 2022-02-23 | Disposition: A | Discharge status: Home / Self Care | Payer: Commercial Managed Care - PPO | Source: Ambulatory Visit | Attending: Surgery | Admitting: Surgery

## 2022-02-23 ENCOUNTER — Other Ambulatory Visit: Payer: Self-pay

## 2022-02-23 VITALS — Ht 60.0 in | Wt 272.0 lb

## 2022-02-23 DIAGNOSIS — E1165 Type 2 diabetes mellitus with hyperglycemia: Secondary | ICD-10-CM

## 2022-02-23 DIAGNOSIS — D0511 Intraductal carcinoma in situ of right breast: Secondary | ICD-10-CM

## 2022-02-23 HISTORY — DX: Prediabetes: R73.03

## 2022-02-23 NOTE — Patient Instructions (Signed)
Your procedure is scheduled on: 03/01/22 Report to [place]. To find out your arrival time please call [phone] between 1PM - 3PM on 02/28/22.  Remember: Instructions that are not followed completely may result in serious medical risk, up to and including death, or upon the discretion of your surgeon and anesthesiologist your surgery may need to be rescheduled.     _X__ 1. Do not eat food after midnight the night before your procedure.                 No gum chewing or hard candies. You may drink clear liquids up to 2 hours                 before you are scheduled to arrive for your surgery- DO not drink clear                 liquids within 2 hours of the start of your surgery.                 Clear Liquids include:  water, apple juice without pulp, clear carbohydrate                 drink such as Clearfast or Gatorade, Black Coffee or Tea (Do not add                 anything to coffee or tea). Diabetics water only  __X__2.  On the morning of surgery brush your teeth with toothpaste and water, you                 may rinse your mouth with mouthwash if you wish.  Do not swallow any              toothpaste of mouthwash.     _X__ 3.  No Alcohol for 24 hours before or after surgery.   _X__ 4.  Do Not Smoke or use e-cigarettes For 24 Hours Prior to Your Surgery.                 Do not use any chewable tobacco products for at least 6 hours prior to                 surgery.  ____  5.  Bring all medications with you on the day of surgery if instructed.   __X__  6.  Notify your doctor if there is any change in your medical condition      (cold, fever, infections).     Do not wear jewelry, make-up, hairpins, clips or nail polish. Do not wear lotions, powders, or perfumes. No Deodorant Do not shave body hair 48 hours prior to surgery. Men may shave face and neck. Do not bring valuables to the hospital.    [facility] is not  responsible for any belongings or valuables.  Contacts, dentures/partials or body piercings may not be worn into surgery. Bring a case for your contacts, glasses or hearing aids, a denture cup will be supplied. Leave your suitcase in the car. After surgery it may be brought to your room. For patients admitted to the hospital, discharge time is determined by your treatment team.   Patients discharged the day of surgery will not be allowed to drive home.    __X__ Take these medicines the morning of surgery with A SIP OF WATER:    1. none  2.   3.   4.  5.  6.  ____ Fleet Enema (as directed)   ____ Use CHG Soap/SAGE wipes as directed  ____ Use inhalers on the day of surgery  ____ Stop metformin/Janumet/Farxiga 2 days prior to surgery    ____ Take 1/2 of usual insulin dose the night before surgery. No insulin the morning          of surgery.   ____ Stop Blood Thinners Coumadin/Plavix/Xarelto/Pleta/Pradaxa/Eliquis/Effient/Aspirin  on   Or contact your Surgeon, Cardiologist or Medical Doctor regarding  ability to stop your blood thinners  __X__ Stop Anti-inflammatories 7 days before surgery such as Advil, Ibuprofen, Motrin,  BC or Goodies Powder, Naprosyn, Naproxen, Aleve, Aspirin   You may continue your Tylenol as needed  __X__ Stop all herbals and supplements, fish oil or vitamins  until after surgery.    ____ Bring C-Pap to the hospital.   You may shower using your Dial soap or stop by our office at [place] and pick up our antibacterial shower wash (CHG soap).

## 2022-03-01 ENCOUNTER — Ambulatory Visit
Admission: RE | Admit: 2022-03-01 | Discharge: 2022-03-01 | Disposition: A | Discharge status: Home / Self Care | Payer: Commercial Managed Care - PPO | Source: Ambulatory Visit | Attending: Surgery | Admitting: Surgery

## 2022-03-01 ENCOUNTER — Other Ambulatory Visit: Payer: Self-pay

## 2022-03-01 ENCOUNTER — Encounter: Payer: Self-pay | Admitting: Surgery

## 2022-03-01 ENCOUNTER — Ambulatory Visit
Admission: RE | Admit: 2022-03-01 | Discharge: 2022-03-01 | Disposition: A | Discharge status: Home / Self Care | Payer: Commercial Managed Care - PPO | Source: Ambulatory Visit | Attending: General Surgery | Admitting: General Surgery

## 2022-03-01 ENCOUNTER — Ambulatory Visit: Payer: Commercial Managed Care - PPO | Admitting: Certified Registered Nurse Anesthetist

## 2022-03-01 ENCOUNTER — Encounter
Admission: RE | Disposition: A | Discharge status: Home / Self Care | Payer: Self-pay | Source: Home / Self Care | Attending: Surgery

## 2022-03-01 ENCOUNTER — Ambulatory Visit
Admission: RE | Admit: 2022-03-01 | Discharge: 2022-03-01 | Disposition: A | Discharge status: Home / Self Care | Payer: Commercial Managed Care - PPO | Attending: Surgery | Admitting: Surgery

## 2022-03-01 DIAGNOSIS — R7303 Prediabetes: Secondary | ICD-10-CM | POA: Diagnosis not present

## 2022-03-01 DIAGNOSIS — Z6841 Body Mass Index (BMI) 40.0 and over, adult: Secondary | ICD-10-CM | POA: Diagnosis not present

## 2022-03-01 DIAGNOSIS — D0511 Intraductal carcinoma in situ of right breast: Secondary | ICD-10-CM

## 2022-03-01 DIAGNOSIS — E1165 Type 2 diabetes mellitus with hyperglycemia: Secondary | ICD-10-CM

## 2022-03-01 DIAGNOSIS — E669 Obesity, unspecified: Secondary | ICD-10-CM | POA: Diagnosis not present

## 2022-03-01 HISTORY — PX: PART MASTECTOMY,RADIO FREQUENCY LOCALIZER,AXILLARY SENTINEL NODE BIOPSY: SHX6901

## 2022-03-01 LAB — GLUCOSE, CAPILLARY: Glucose-Capillary: 216 mg/dL — ABNORMAL HIGH (ref 70–99)

## 2022-03-01 SURGERY — PART MASTECTOMY,RADIO FREQUENCY LOCALIZER,AXILLARY SENTINEL NODE BIOPSY
Anesthesia: General | Site: Breast | Laterality: Right

## 2022-03-01 MED ORDER — FAMOTIDINE 20 MG PO TABS
ORAL_TABLET | ORAL | Status: AC
  Administered 2022-03-01: 20 mg via ORAL
  Filled 2022-03-01: qty 1

## 2022-03-01 MED ORDER — HYDROCODONE-ACETAMINOPHEN 5-325 MG PO TABS: 1.0000 | ORAL_TABLET | Freq: Four times a day (QID) | ORAL | 0 refills | Status: DC | PRN

## 2022-03-01 MED ORDER — CEFAZOLIN SODIUM-DEXTROSE 2-4 GM/100ML-% IV SOLN
2.0000 g | INTRAVENOUS | Status: AC
  Administered 2022-03-01: 2 g via INTRAVENOUS

## 2022-03-01 MED ORDER — CEFAZOLIN SODIUM-DEXTROSE 2-4 GM/100ML-% IV SOLN
INTRAVENOUS | Status: AC
  Filled 2022-03-01: qty 100

## 2022-03-01 MED ORDER — BUPIVACAINE-EPINEPHRINE (PF) 0.5% -1:200000 IJ SOLN
INTRAMUSCULAR | Status: AC
  Filled 2022-03-01: qty 30

## 2022-03-01 MED ORDER — IBUPROFEN 800 MG PO TABS: 800.0000 mg | ORAL_TABLET | Freq: Three times a day (TID) | ORAL | 0 refills | Status: DC | PRN

## 2022-03-01 MED ORDER — CHLORHEXIDINE GLUCONATE 0.12 % MT SOLN
OROMUCOSAL | Status: AC
  Administered 2022-03-01: 15 mL via OROMUCOSAL
  Filled 2022-03-01: qty 15

## 2022-03-01 MED ORDER — DROPERIDOL 2.5 MG/ML IJ SOLN: 0.6250 mg | Freq: Once | INTRAMUSCULAR | Status: AC

## 2022-03-01 MED ORDER — ONDANSETRON HCL 4 MG/2ML IJ SOLN
INTRAMUSCULAR | Status: AC
  Filled 2022-03-01: qty 2

## 2022-03-01 MED ORDER — FAMOTIDINE 20 MG PO TABS: 20.0000 mg | ORAL_TABLET | Freq: Once | ORAL | Status: AC

## 2022-03-01 MED ORDER — LIDOCAINE HCL 1 % IJ SOLN
INTRAMUSCULAR | Status: DC | PRN
  Administered 2022-03-01: 27 mL via INTRAMUSCULAR
  Administered 2022-03-01: 33 mL via INTRAMUSCULAR

## 2022-03-01 MED ORDER — ACETAMINOPHEN 10 MG/ML IV SOLN
INTRAVENOUS | Status: AC
  Filled 2022-03-01: qty 100

## 2022-03-01 MED ORDER — ONDANSETRON HCL 4 MG/2ML IJ SOLN
4.0000 mg | Freq: Once | INTRAMUSCULAR | Status: AC | PRN
  Administered 2022-03-01: 4 mg via INTRAVENOUS

## 2022-03-01 MED ORDER — ROCURONIUM BROMIDE 100 MG/10ML IV SOLN
INTRAVENOUS | Status: DC | PRN
  Administered 2022-03-01: 10 mg via INTRAVENOUS
  Administered 2022-03-01: 60 mg via INTRAVENOUS
  Administered 2022-03-01: 10 mg via INTRAVENOUS

## 2022-03-01 MED ORDER — PHENYLEPHRINE HCL-NACL 20-0.9 MG/250ML-% IV SOLN
INTRAVENOUS | Status: DC | PRN
  Administered 2022-03-01: 50 ug/min via INTRAVENOUS

## 2022-03-01 MED ORDER — DROPERIDOL 2.5 MG/ML IJ SOLN
INTRAMUSCULAR | Status: AC
  Administered 2022-03-01: 0.625 mg via INTRAVENOUS
  Filled 2022-03-01: qty 2

## 2022-03-01 MED ORDER — FENTANYL CITRATE (PF) 100 MCG/2ML IJ SOLN
INTRAMUSCULAR | Status: DC | PRN
  Administered 2022-03-01 (×3): 50 ug via INTRAVENOUS

## 2022-03-01 MED ORDER — ACETAMINOPHEN 325 MG PO TABS: 650.0000 mg | ORAL_TABLET | Freq: Three times a day (TID) | ORAL | 0 refills | Status: AC | PRN

## 2022-03-01 MED ORDER — HYDROCODONE-ACETAMINOPHEN 5-325 MG PO TABS: 1.0000 | ORAL_TABLET | Freq: Once | ORAL | Status: AC

## 2022-03-01 MED ORDER — PHENYLEPHRINE HCL-NACL 20-0.9 MG/250ML-% IV SOLN
INTRAVENOUS | Status: AC
  Filled 2022-03-01: qty 250

## 2022-03-01 MED ORDER — DOCUSATE SODIUM 100 MG PO CAPS: 100.0000 mg | ORAL_CAPSULE | Freq: Two times a day (BID) | ORAL | 0 refills | Status: AC | PRN

## 2022-03-01 MED ORDER — DEXAMETHASONE SODIUM PHOSPHATE 10 MG/ML IJ SOLN
INTRAMUSCULAR | Status: DC | PRN
  Administered 2022-03-01: 5 mg via INTRAVENOUS

## 2022-03-01 MED ORDER — LIDOCAINE HCL (CARDIAC) PF 100 MG/5ML IV SOSY
PREFILLED_SYRINGE | INTRAVENOUS | Status: DC | PRN
  Administered 2022-03-01: 70 mg via INTRAVENOUS

## 2022-03-01 MED ORDER — FENTANYL CITRATE (PF) 100 MCG/2ML IJ SOLN
INTRAMUSCULAR | Status: AC
  Filled 2022-03-01: qty 2

## 2022-03-01 MED ORDER — LIDOCAINE HCL (PF) 1 % IJ SOLN
INTRAMUSCULAR | Status: AC
  Filled 2022-03-01: qty 30

## 2022-03-01 MED ORDER — FENTANYL CITRATE (PF) 100 MCG/2ML IJ SOLN
25.0000 ug | INTRAMUSCULAR | Status: DC | PRN
  Administered 2022-03-01: 25 ug via INTRAVENOUS

## 2022-03-01 MED ORDER — MIDAZOLAM HCL 2 MG/2ML IJ SOLN
INTRAMUSCULAR | Status: AC
  Filled 2022-03-01: qty 2

## 2022-03-01 MED ORDER — ORAL CARE MOUTH RINSE: 15.0000 mL | Freq: Once | OROMUCOSAL | Status: AC

## 2022-03-01 MED ORDER — PHENYLEPHRINE HCL (PRESSORS) 10 MG/ML IV SOLN
INTRAVENOUS | Status: DC | PRN
  Administered 2022-03-01: 80 ug via INTRAVENOUS
  Administered 2022-03-01: 120 ug via INTRAVENOUS
  Administered 2022-03-01: 80 ug via INTRAVENOUS
  Administered 2022-03-01: 160 ug via INTRAVENOUS
  Administered 2022-03-01: 80 ug via INTRAVENOUS

## 2022-03-01 MED ORDER — MIDAZOLAM HCL 2 MG/2ML IJ SOLN
INTRAMUSCULAR | Status: DC | PRN
  Administered 2022-03-01: 2 mg via INTRAVENOUS

## 2022-03-01 MED ORDER — DOCUSATE SODIUM 100 MG PO CAPS: 100.0000 mg | ORAL_CAPSULE | Freq: Two times a day (BID) | ORAL | 0 refills | Status: DC | PRN

## 2022-03-01 MED ORDER — PROPOFOL 10 MG/ML IV BOLUS
INTRAVENOUS | Status: AC
  Filled 2022-03-01: qty 40

## 2022-03-01 MED ORDER — PROPOFOL 10 MG/ML IV BOLUS
INTRAVENOUS | Status: DC | PRN
  Administered 2022-03-01: 160 mg via INTRAVENOUS

## 2022-03-01 MED ORDER — HYDROCODONE-ACETAMINOPHEN 5-325 MG PO TABS
ORAL_TABLET | ORAL | Status: AC
  Administered 2022-03-01: 1 via ORAL
  Filled 2022-03-01: qty 1

## 2022-03-01 MED ORDER — LACTATED RINGERS IV SOLN: INTRAVENOUS | Status: DC

## 2022-03-01 MED ORDER — SUGAMMADEX SODIUM 200 MG/2ML IV SOLN
INTRAVENOUS | Status: DC | PRN
  Administered 2022-03-01: 300 mg via INTRAVENOUS

## 2022-03-01 MED ORDER — ACETAMINOPHEN 10 MG/ML IV SOLN
INTRAVENOUS | Status: DC | PRN
  Administered 2022-03-01: 1000 mg via INTRAVENOUS

## 2022-03-01 MED ORDER — CEFAZOLIN SODIUM-DEXTROSE 1-4 GM/50ML-% IV SOLN
INTRAVENOUS | Status: DC | PRN
  Administered 2022-03-01: 1 g via INTRAVENOUS

## 2022-03-01 MED ORDER — CHLORHEXIDINE GLUCONATE 0.12 % MT SOLN: 15.0000 mL | Freq: Once | OROMUCOSAL | Status: AC

## 2022-03-01 MED ORDER — ACETAMINOPHEN 325 MG PO TABS: 650.0000 mg | ORAL_TABLET | Freq: Three times a day (TID) | ORAL | 0 refills | Status: DC | PRN

## 2022-03-01 MED ORDER — STERILE WATER FOR IRRIGATION IR SOLN
Status: DC | PRN
  Administered 2022-03-01: 1000 mL

## 2022-03-01 MED ORDER — CHLORHEXIDINE GLUCONATE CLOTH 2 % EX PADS
6.0000 | MEDICATED_PAD | Freq: Once | CUTANEOUS | Status: AC
  Administered 2022-03-01: 6 via TOPICAL

## 2022-03-01 MED ORDER — TECHNETIUM TC 99M TILMANOCEPT KIT
1.0530 | PACK | Freq: Once | INTRAVENOUS | Status: AC | PRN
  Administered 2022-03-01: 1.053 via INTRADERMAL

## 2022-03-01 MED ORDER — ONDANSETRON HCL 4 MG/2ML IJ SOLN
INTRAMUSCULAR | Status: DC | PRN
  Administered 2022-03-01: 4 mg via INTRAVENOUS

## 2022-03-01 SURGICAL SUPPLY — 48 items
ADH SKN CLS APL DERMABOND .7 (GAUZE/BANDAGES/DRESSINGS) ×2
APL PRP STRL LF DISP 70% ISPRP (MISCELLANEOUS) ×1
APPLIER CLIP 11 MED OPEN (CLIP) ×1
APR CLP MED 11 20 MLT OPN (CLIP) ×1
BLADE SURG 15 STRL LF DISP TIS (BLADE) ×1 IMPLANT
BLADE SURG 15 STRL SS (BLADE) ×1
CHLORAPREP W/TINT 26 (MISCELLANEOUS) ×1 IMPLANT
CLIP APPLIE 11 MED OPEN (CLIP) IMPLANT
CNTNR SPEC 2.5X3XGRAD LEK (MISCELLANEOUS)
CONT SPEC 4OZ STRL OR WHT (MISCELLANEOUS)
CONTAINER SPEC 2.5X3XGRAD LEK (MISCELLANEOUS) IMPLANT
DERMABOND ADVANCED .7 DNX12 (GAUZE/BANDAGES/DRESSINGS) ×1 IMPLANT
DEVICE DSSCT PLSMBLD 3.0S LGHT (MISCELLANEOUS) ×1 IMPLANT
DEVICE DUBIN SPECIMEN MAMMOGRA (MISCELLANEOUS) ×1 IMPLANT
DRAPE LAPAROTOMY 77X122 PED (DRAPES) ×1 IMPLANT
ELECT CAUTERY BLADE TIP 2.5 (TIP) ×1
ELECT REM PT RETURN 9FT ADLT (ELECTROSURGICAL) ×1
ELECTRODE CAUTERY BLDE TIP 2.5 (TIP) ×1 IMPLANT
ELECTRODE REM PT RTRN 9FT ADLT (ELECTROSURGICAL) ×1 IMPLANT
GAUZE 4X4 16PLY ~~LOC~~+RFID DBL (SPONGE) ×1 IMPLANT
GLOVE BIOGEL PI IND STRL 7.0 (GLOVE) ×1 IMPLANT
GLOVE SURG SYN 6.5 ES PF (GLOVE) ×2 IMPLANT
GLOVE SURG SYN 6.5 PF PI (GLOVE) ×2 IMPLANT
GOWN STRL REUS W/ TWL LRG LVL3 (GOWN DISPOSABLE) ×3 IMPLANT
GOWN STRL REUS W/TWL LRG LVL3 (GOWN DISPOSABLE) ×3
KIT MARKER MARGIN INK (KITS) ×1 IMPLANT
KIT TURNOVER KIT A (KITS) ×1 IMPLANT
LABEL OR SOLS (LABEL) ×1 IMPLANT
LIGHT WAVEGUIDE WIDE FLAT (MISCELLANEOUS) IMPLANT
MANIFOLD NEPTUNE II (INSTRUMENTS) ×1 IMPLANT
MARKER MARGIN CORRECT CLIP (MARKER) ×1 IMPLANT
NEEDLE HYPO 22GX1.5 SAFETY (NEEDLE) ×2 IMPLANT
PACK BASIN MINOR ARMC (MISCELLANEOUS) ×1 IMPLANT
PLASMABLADE 3.0S W/LIGHT (MISCELLANEOUS) ×1
SET LOCALIZER 20 PROBE US (MISCELLANEOUS) ×1 IMPLANT
SUT MNCRL 4-0 (SUTURE) ×2
SUT MNCRL 4-0 27XMFL (SUTURE) ×2
SUT SILK 3 0 12 30 (SUTURE) IMPLANT
SUT VIC AB 3-0 SH 27 (SUTURE) ×2
SUT VIC AB 3-0 SH 27X BRD (SUTURE) ×2 IMPLANT
SUTURE MNCRL 4-0 27XMF (SUTURE) ×2 IMPLANT
SYR 20ML LL LF (SYRINGE) ×1 IMPLANT
SYR BULB IRRIG 60ML STRL (SYRINGE) ×1 IMPLANT
TRAP FLUID SMOKE EVACUATOR (MISCELLANEOUS) ×1 IMPLANT
TRAP NEPTUNE SPECIMEN COLLECT (MISCELLANEOUS) ×1 IMPLANT
TUBING CONNECTING 10 (TUBING) ×1 IMPLANT
WATER STERILE IRR 1000ML POUR (IV SOLUTION) ×1 IMPLANT
WATER STERILE IRR 500ML POUR (IV SOLUTION) ×1 IMPLANT

## 2022-03-01 NOTE — Anesthesia Procedure Notes (Signed)
Procedure Name: Intubation Date/Time: 03/01/2022 9:18 AM  Performed by: Lowry Bowl, CRNAPre-anesthesia Checklist: Patient identified, Emergency Drugs available, Suction available and Patient being monitored Patient Re-evaluated:Patient Re-evaluated prior to induction Oxygen Delivery Method: Circle system utilized Preoxygenation: Pre-oxygenation with 100% oxygen Induction Type: IV induction and Cricoid Pressure applied Ventilation: Mask ventilation without difficulty Laryngoscope Size: 3 and McGraph Grade View: Grade II Tube type: Oral Tube size: 6.5 mm Number of attempts: 1 Airway Equipment and Method: Stylet and Video-laryngoscopy Placement Confirmation: ETT inserted through vocal cords under direct vision, positive ETCO2 and breath sounds checked- equal and bilateral Secured at: 20 cm Tube secured with: Tape Dental Injury: Teeth and Oropharynx as per pre-operative assessment

## 2022-03-01 NOTE — Interval H&P Note (Signed)
No change. OK to proceed.

## 2022-03-01 NOTE — Op Note (Addendum)
Preoperative diagnosis: Right breast DCIS.  Postoperative diagnosis: Same.   Procedure: RF tag-localized right breast partial mastectomy.                      Right axillary Sentinel Lymph node biopsy  Anesthesia: GETA  Surgeon: Dr. Benjamine Sprague  Wound Classification: Clean  Indications: Patient is a 51 y.o. female with a nonpalpable right breast mass noted on mammography with core biopsy demonstrating DCIS requires [facility] localizer placement, partial mastectomy for treatment with sentinel lymph node biopsy.   Specimen: Right breast mass, Sentinel Lymph nodes x 2  Complications: None  Estimated Blood Loss: 25 mL  Findings: 1. Specimen mammography shows marker and RF localizer on specimen 2. Pathology call refers gross examination of margins was normal 3. No other palpable mass or lymph node identified.   Operation performed with curative intent:Yes  Tracer(s) used to identify sentinel nodes in the upfront surgery (non-neoadjuvant) setting (select all that apply):Dye  Tracer(s) used to identify sentinel nodes in the neoadjuvant setting (select all that apply):Radioactive Tracer  All nodes (colored or non-colored) present at the end of a dye-filled lymphatic channel were removed:N/A  All significantly radioactive nodes were removed:Yes  All palpable suspicious nodes were removed:Yes  Biopsy-proven positive nodes marked with clips prior to chemotherapy were identified and removed:N/A    Description of procedure: RF localization was performed by radiology prior to procedure. In the nuclear medicine suite, the subareolar region was injected with Tc-99 sulfur colloid the morning of procedure. Localization studies were reviewed. The patient was taken to the operating room and placed supine on the operating table, and after general anesthesia the right breast and axilla were prepped and draped in the usual sterile fashion. A time-out was completed verifying correct patient,  procedure, site, positioning, and implant(s) and/or special equipment prior to beginning this procedure.   By identifying the [facility] localizer, the probable trajectory and location of the mass was visualized. A skin incision was planned in such a way as to minimize the amount of dissection to reach the mass.  The skin incision was made after infusion of local.  Despite the high count prior to the skin incision, placing the probe within the incision did not yield the same count as previously noted prior to the incision.  Repeat triangulation based on mammogram and repeat probing noted the actual Magseed to be more lower outer than the initial incision site.  Attempt was made to localize the site through the initial incision made but this proved to be unsuccessful.  Decision made to proceed with opening a smaller second incision more directly over the persistently elevated count area.  Local infused around second site and skin incision made and dissection carried down to the mass, taking care to include the entire [facility] localizer and a margin of grossly normal tissue. The specimen was removed. The specimen was oriented with paint.  Imaging reviewed and the entire target lesion had been resected, with biopsy clip and localizer within the specimen.  Gross margin analysis by pathology confirmed all margins cleared on initial inspection  A hand-held gamma probe was used to identify the location of the hottest spot in the axilla. An incision was made around the caudal axillary hairline. Sharp and blunt Dissection was carried down to subdermal facias.  Dissection was extremely difficult due to the very thick subcutaneous tissue requiring longer instruments and additional assistance from a second tech for better visualization.  The probe was placed within wound and  again, the point of maximal count was found. Dissection continue until nodule was identified. The probe was placed in contact with the node and 12000  counts were recorded. The node was excised in its entirety. Ex vivo, the node measured 12000 counts when placed on the probe. The bed of the node measured 100-300 counts. An additional hot spot was detected and the node was excised in similar fashion. No additional hot spots were identified.  Only clinically abnormal node palpated was initial sentinel lymph node.  Clips used within the area of node dissection on the visible veins for adequate hemostasis.   All wounds irrigated, hemostasis confirmed and the wounds closed in layers with  interrupted sutures of 3-0 Vicryl in deep dermal layer and a running subcuticular suture of Monocryl 4-0, then dressed with dermabond. The patient tolerated the procedure well and was taken to the postanesthesia care unit in stable condition. Sponge and instrument count correct at end of procedure.

## 2022-03-01 NOTE — Discharge Instructions (Addendum)
AMBULATORY SURGERY  DISCHARGE INSTRUCTIONS   The drugs that you were given will stay in your system until tomorrow so for the next 24 hours you should not:  Drive an automobile Make any legal decisions Drink any alcoholic beverage   You may resume regular meals tomorrow.  Today it is better to start with liquids and gradually work up to solid foods.  You may eat anything you prefer, but it is better to start with liquids, then soup and crackers, and gradually work up to solid foods.   Please notify your doctor immediately if you have any unusual bleeding, trouble breathing, redness and pain at the surgery site, drainage, fever, or pain not relieved by medication.    Additional Instructions:        Please contact your physician with any problems or Same Day Surgery at [phone], Monday through Friday 6 am to 4 pm, or  at [facility] number at [phone].Removal, Care After This sheet gives you information about how to care for yourself after your procedure. Your health care provider may also give you more specific instructions. If you have problems or questions, contact your health care provider. What can I expect after the procedure? After the procedure, it is common to have: Soreness. Bruising. Itching. Follow these instructions at home: site care Follow instructions from your health care provider about how to take care of your site. Make sure you: Wash your hands with soap and water before and after you change your bandage (dressing). If soap and water are not available, use hand sanitizer. Leave stitches (sutures), skin glue, or adhesive strips in place. These skin closures may need to stay in place for 2 weeks or longer. If adhesive strip edges start to loosen and curl up, you may trim the loose edges. Do not remove adhesive strips completely unless your health care provider tells you to do that. If the area bleeds or bruises, apply gentle pressure for 10  minutes. OK TO SHOWER IN 24HRS  Check your site every day for signs of infection. Check for: Redness, swelling, or pain. Fluid or blood. Warmth. Pus or a bad smell.  General instructions Rest and then return to your normal activities as told by your health care provider.  tylenol and advil as needed for discomfort.  Please alternate between the two every four hours as needed for pain.    Use narcotics, if prescribed, only when tylenol and motrin is not enough to control pain.  325-'650mg'$  every 8hrs to max of '3000mg'$ /24hrs (including the '325mg'$  in every norco dose) for the tylenol.    Advil up to '800mg'$  per dose every 8hrs as needed for pain.   Keep all follow-up visits as told by your health care provider. This is important. Contact a health care provider if: You have redness, swelling, or pain around your site. You have fluid or blood coming from your site. Your site feels warm to the touch. You have pus or a bad smell coming from your site. You have a fever. Your sutures, skin glue, or adhesive strips loosen or come off sooner than expected. Get help right away if: You have bleeding that does not stop with pressure or a dressing. Summary After the procedure, it is common to have some soreness, bruising, and itching at the site. Follow instructions from your health care provider about how to take care of your site. Check your site every day for signs of infection. Contact a health care provider if you have  redness, swelling, or pain around your site, or your site feels warm to the touch. Keep all follow-up visits as told by your health care provider. This is important. This information is not intended to replace advice given to you by your health care provider. Make sure you discuss any questions you have with your health care provider. Document Released: 03/18/2015 Document Revised: 08/19/2017 Document Reviewed: 08/19/2017 Elsevier Interactive Patient Education  [employer].

## 2022-03-01 NOTE — Transfer of Care (Signed)
Immediate Anesthesia Transfer of Care Note  Patient: Veronica Chandler  Procedure(s) Performed: PART [place] SENTINEL NODE BIOPSY (Right: Breast)  Patient Location: PACU  Anesthesia Type:General  Level of Consciousness: awake, drowsy, and patient cooperative  Airway & Oxygen Therapy: Patient Spontanous Breathing and Patient connected to face mask oxygen  Post-op Assessment: Report given to RN and Post -op Vital signs reviewed and stable  Post vital signs: Reviewed and stable  Last Vitals:  Vitals Value Taken Time  BP 175/103 03/01/22 1137  Temp    Pulse 92 03/01/22 1141  Resp 24 03/01/22 1141  SpO2 99 % 03/01/22 1141  Vitals shown include unvalidated device data.  Last Pain:  Vitals:   03/01/22 0841  TempSrc: Oral         Complications: No notable events documented.

## 2022-03-01 NOTE — H&P (Signed)
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 female who was referred by [name], NP for evaluation of above. Change was noted on last screening mammogram. No issues since the biopsy.   Past Medical History: has a past medical history of Allergy and Extreme obesity.  Past Surgical History: has no past surgical history on file.  Family History: family history includes Diabetes type II in her father; High blood pressure (Hypertension) in her mother; Prostate cancer in her father.  Social History: reports that she has never smoked. She has never used smokeless tobacco. She reports current alcohol use. She reports that she does not use drugs.  Current Medications: has a current medication list which includes the following prescription(s): fluticasone propionate, levocetirizine, multivitamin, and lidocaine-prilocaine.  Allergies: Allergies as of 02/06/2022 (No Known Allergies)  ROS: A 15 point review of systems was performed and was negative except as noted in HPI  Objective:   BP (!) 148/84  Pulse 92  Ht 152.4 cm (5')  BMI 41.40 kg/m  Constitutional : No distress, cooperative, alert Lymphatics/Throat: Supple with no lymphadenopathy Respiratory: Clear to auscultation bilaterally Cardiovascular: Regular rate and rhythm Gastrointestinal: Soft, non-tender, non-distended, no organomegaly. Musculoskeletal: Steady gait and movement Skin: Cool and moist, no surgical scars Psychiatric: Normal affect, non-agitated, not confused Breast: Normal appearance and no palpable abnormality in right breast and axilla. Chaperone present for exam. Left breast deferred.   LABS: SURGICAL PATHOLOGY SURGICAL PATHOLOGY CASE: [phone] PATIENT: Veronica Chandler Surgical Pathology Report  Specimen Submitted: A. Breast, right upper outer  Clinical History: Calcifications in right upper outer breast  DIAGNOSIS: A. BREAST  CALCIFICATIONS, RIGHT UPPER OUTER; STEREOTACTIC BIOPSY: - DUCTAL CARCINOMA IN SITU (DCIS), HIGH-GRADE COMEDO-TYPE. - CALCIFICATIONS ASSOCIATED WITH DCIS.  Comment: DCIS is present in 6 of 7 blocks. Focal sclerosis with distortion is noted. Definite invasive carcinoma is not identified. ER testing is deferred to an excision specimen.  GROSS DESCRIPTION: A. Labeled: Right breast stereo biopsy calcs upper outer Received: in a formalin-filled [place] collection device Specimen radiograph image(s) available for review Time/Date in fixative: Collected at 9:26 AM on 02/01/2022 and placed in formalin at 9:28 AM on 02/01/2022 Cold ischemic time: Less than 5 minutes Total fixation time: Approximately 10.75 hours Core pieces: Multiple Measurement: Aggregate, 6.7 x 1.1 x 0.3 cm Description / comments: Received are cores and fragments of yellow fibrofatty tissue. The accompanying diagram has sections A-F checked. Inked: Black Entirely submitted in cassette(s):  1 - section A 2 - section B 3 - section C 4 - section D 5 - section E 6 - section F 7 - remaining tissue from section G and freely floating within the container  RB 02/01/2022  Final Diagnosis performed by Quay Burow, MD. Electronically signed 02/02/2022 9:34:50AM The electronic signature indicates that the named Attending Pathologist has evaluated the specimen Technical component performed at [place], [address] [place], [place] [address] Lab: [phone] Dir: Rush Farmer, MD, MMM Professional component performed at [facility], [facility], [place], [place], [place] [address] Lab: [phone] Dir: Kathi Simpers, MD  RADS: [place] [facility] DATA: Callback for RIGHT breast calcifications.  EXAM: DIGITAL DIAGNOSTIC UNILATERAL RIGHT MAMMOGRAM WITH TOMOSYNTHESIS  TECHNIQUE: Right digital diagnostic mammography and breast tomosynthesis was performed.  COMPARISON: Previous exam(s).  ACR Breast  Density Category b: There are scattered areas of fibroglandular density.  FINDINGS: Spot magnification views of the RIGHT breast demonstrate a 17 mm group of amorphous and  punctate calcifications in the RIGHT outer slightly upper breast at middle depth. These are not definitively stable in comparison to prior mammograms. No additional suspicious findings are noted.  IMPRESSION: 1. A 17 mm group of calcifications in the upper outer RIGHT breast at middle depth are indeterminate. Recommend stereotactic guided biopsy for definitive characterization.  RECOMMENDATION: RIGHT breast stereotactic guided biopsy x1  I have discussed the findings and recommendations with the patient. The biopsy procedure was discussed with the patient and questions were answered. Patient expressed their understanding of the biopsy recommendation. Patient will be scheduled for biopsy at her earliest convenience by the schedulers. Ordering provider will be notified. If applicable, a reminder letter will be sent to the patient regarding the next appointment.  BI-RADS CATEGORY 4: Suspicious.  Electronically Signed By: Valentino Saxon M.D. 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 The risks of general anesthetic, if used, includes MI, CVA, sudden death or even reaction to anesthetic medications also discussed. Typical post-op recovery time and possbility of activity restrictions were also discussed. Alternatives include continued observation. Benefits include possible symptom relief, pathologic evaluation, and/or curative excision.  The patient verbalized understanding and all questions were answered to  the patient's satisfaction.  2. Patient has elected to proceed with surgical treatment. Procedure will be scheduled. Right breast partial mastectomy, sentinel lymph node biopsy  labs/images/medications/previous chart entries reviewed personally and relevant changes/updates noted above.

## 2022-03-01 NOTE — Anesthesia Preprocedure Evaluation (Signed)
Anesthesia Evaluation  Patient identified by MRN, date of birth, ID band Patient awake    Reviewed: Allergy & Precautions, H&P , NPO status , Patient's Chart, lab work & pertinent test results, reviewed documented beta blocker date and time   Airway Mallampati: III  TM Distance: >3 FB Neck ROM: full    Dental  (+) Teeth Intact   Pulmonary neg pulmonary ROS   Pulmonary exam normal        Cardiovascular Exercise Tolerance: Good negative cardio ROS Normal cardiovascular exam Rhythm:regular Rate:Normal     Neuro/Psych negative neurological ROS  negative psych ROS   GI/Hepatic negative GI ROS, Neg liver ROS,,,  Endo/Other  diabetes    Renal/GU negative Renal ROS  negative genitourinary   Musculoskeletal   Abdominal   Peds  Hematology negative hematology ROS (+)   Anesthesia Other Findings Past Medical History: No date: Medical history non-contributory No date: Pre-diabetes Past Surgical History: 02/01/2022: BREAST BIOPSY; Right     Comment:  stereo bx, calcs, "X" clip-DCIS 02/01/2022: BREAST BIOPSY; Right     Comment:  MM RT BREAST BX W LOC DEV 1ST LESION IMAGE BX SPEC               STEREO GUIDE 02/01/2022 [facility] 02/14/2022: BREAST BIOPSY; Right     Comment:  MM RT RADIO FREQUENCY TAG LOC MAMMO GUIDE 02/14/2022               [facility] 06/16/2020: COLONOSCOPY WITH PROPOFOL; N/A     Comment:  Procedure: COLONOSCOPY WITH PROPOFOL;  Surgeon: Jonathon Bellows, MD;  Location: [facility] ENDOSCOPY;  Service:               Gastroenterology;  Laterality: N/A; No date: NO PAST SURGERIES BMI    Body Mass Index: 53.12 kg/m     Reproductive/Obstetrics negative OB ROS                             Anesthesia Physical Anesthesia Plan  ASA: 3  Anesthesia Plan: General ETT   Post-op Pain Management:    Induction:   PONV Risk Score and Plan: 4 or greater  Airway  Management Planned:   Additional Equipment:   Intra-op Plan:   Post-operative Plan:   Informed Consent: I have reviewed the patients History and Physical, chart, labs and discussed the procedure including the risks, benefits and alternatives for the proposed anesthesia with the patient or authorized representative who has indicated his/her understanding and acceptance.     Dental Advisory Given  Plan Discussed with: CRNA  Anesthesia Plan Comments:        Anesthesia Quick Evaluation

## 2022-03-02 ENCOUNTER — Other Ambulatory Visit: Payer: Self-pay | Admitting: Anatomic Pathology & Clinical Pathology

## 2022-03-02 ENCOUNTER — Encounter: Payer: Self-pay | Admitting: Surgery

## 2022-03-02 NOTE — Anesthesia Postprocedure Evaluation (Signed)
Anesthesia Post Note  Patient: Brenlee Koskela  Procedure(s) Performed: PART [place] SENTINEL NODE BIOPSY (Right: Breast)  Patient location during evaluation: PACU Anesthesia Type: General Level of consciousness: awake and alert Pain management: pain level controlled Vital Signs Assessment: post-procedure vital signs reviewed and stable Respiratory status: spontaneous breathing, nonlabored ventilation, respiratory function stable and patient connected to nasal cannula oxygen Cardiovascular status: blood pressure returned to baseline and stable Postop Assessment: no apparent nausea or vomiting Anesthetic complications: no   No notable events documented.   Last Vitals:  Vitals:   03/01/22 1254 03/01/22 1350  BP: (!) 164/89 (!) 158/93  Pulse: 81 78  Resp: 16 16  Temp: (!) 36.1 C   SpO2: 98% 96%    Last Pain:  Vitals:   03/02/22 0840  TempSrc:   PainSc: 0-No pain                 Molli Barrows

## 2022-03-06 LAB — SURGICAL PATHOLOGY

## 2022-03-07 ENCOUNTER — Encounter: Payer: Self-pay | Admitting: *Deleted

## 2022-03-07 DIAGNOSIS — D0511 Intraductal carcinoma in situ of right breast: Secondary | ICD-10-CM

## 2022-03-07 NOTE — Progress Notes (Signed)
Patient scheduled to see Dr. Tasia Catchings and Dr. Baruch Gouty post lumpectomy on 03/22/22.   Appt. Details given to her.   She is doing well since surgery with only minimum pain.

## 2022-03-22 ENCOUNTER — Ambulatory Visit
Admission: RE | Admit: 2022-03-22 | Discharge: 2022-03-22 | Disposition: A | Discharge status: Home / Self Care | Payer: Commercial Managed Care - PPO | Source: Ambulatory Visit | Attending: Radiation Oncology | Admitting: Radiation Oncology

## 2022-03-22 ENCOUNTER — Inpatient Hospital Stay (HOSPITAL_BASED_OUTPATIENT_CLINIC_OR_DEPARTMENT_OTHER): Payer: Commercial Managed Care - PPO | Admitting: Oncology

## 2022-03-22 ENCOUNTER — Encounter: Payer: Self-pay | Admitting: Oncology

## 2022-03-22 VITALS — BP 173/105 | HR 112 | Temp 97.7°F | Wt 270.8 lb

## 2022-03-22 DIAGNOSIS — Z833 Family history of diabetes mellitus: Secondary | ICD-10-CM | POA: Insufficient documentation

## 2022-03-22 DIAGNOSIS — Z8042 Family history of malignant neoplasm of prostate: Secondary | ICD-10-CM | POA: Insufficient documentation

## 2022-03-22 DIAGNOSIS — C50411 Malignant neoplasm of upper-outer quadrant of right female breast: Secondary | ICD-10-CM

## 2022-03-22 DIAGNOSIS — D0511 Intraductal carcinoma in situ of right breast: Secondary | ICD-10-CM | POA: Insufficient documentation

## 2022-03-22 DIAGNOSIS — F1290 Cannabis use, unspecified, uncomplicated: Secondary | ICD-10-CM | POA: Insufficient documentation

## 2022-03-22 DIAGNOSIS — Z809 Family history of malignant neoplasm, unspecified: Secondary | ICD-10-CM | POA: Diagnosis not present

## 2022-03-22 DIAGNOSIS — Z801 Family history of malignant neoplasm of trachea, bronchus and lung: Secondary | ICD-10-CM | POA: Insufficient documentation

## 2022-03-22 DIAGNOSIS — Z800 Family history of malignant neoplasm of digestive organs: Secondary | ICD-10-CM | POA: Insufficient documentation

## 2022-03-22 DIAGNOSIS — Z79899 Other long term (current) drug therapy: Secondary | ICD-10-CM | POA: Insufficient documentation

## 2022-03-22 NOTE — Consult Note (Signed)
NEW PATIENT EVALUATION  Name: Veronica Chandler  MRN: [id number]  Date:   03/22/2022     DOB: 1970/12/26   This 52 y.o. female patient presents to the clinic for initial evaluation of stage 0 (C. Tis N0 M0).  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   DIAGNOSIS: There were no encounter diagnoses.   PREVIOUS INVESTIGATIONS:  Surgical pathology reviewed Mammograms and ultrasound reviewed Clinical notes reviewed  HPI: Patient is a 52 year old female who was found on mammogram to have a 1.7 cm area of amorphous and punctate calcifications in the right outer upper right breast for which stereotactic guided biopsy was performed.  Pathology was positive for ductal carcinoma in situ.  She underwent a wide local excision for 6 mm of grade 2 ductal carcinoma in situ with margins clear at 0.5 mm.  2 sentinel lymph nodes were negative for metastatic involvement.  Tumor was strongly ER positive.  She had some slight cauterization injury around the area of wide local excision although that is healing well.  She has been seen by Dr. Tasia Catchings and is recommended for endocrine therapy after completion of radiation.  She otherwise is doing well.  She specifically denies breast tenderness cough or bone pain.  PLANNED TREATMENT REGIMEN: Right whole breast radiation  PAST MEDICAL HISTORY:  has a past medical history of Medical history non-contributory and Pre-diabetes.    PAST SURGICAL HISTORY:  Past Surgical History:  Procedure Laterality Date   BREAST BIOPSY Right 02/01/2022   stereo bx, calcs, "X" clip-DCIS   BREAST BIOPSY Right 02/01/2022   MM RT BREAST BX W LOC DEV 1ST LESION IMAGE BX SPEC STEREO GUIDE 02/01/2022 [facility]   BREAST BIOPSY Right 02/14/2022   MM RT RADIO FREQUENCY TAG LOC MAMMO GUIDE 02/14/2022 [facility]   COLONOSCOPY WITH PROPOFOL N/A 06/16/2020    Procedure: COLONOSCOPY WITH PROPOFOL;  Surgeon: Jonathon Bellows, MD;  Location: [facility] ENDOSCOPY;  Service: Gastroenterology;  Laterality: N/A;   NO PAST SURGERIES     PART MASTECTOMY,RADIO FREQUENCY LOCALIZER,AXILLARY SENTINEL NODE BIOPSY Right 03/01/2022   Procedure: PART MASTECTOMY,RADIO FREQUENCY LOCALIZER,AXILLARY SENTINEL NODE BIOPSY;  Surgeon: Benjamine Sprague, DO;  Location: [facility] ORS;  Service: General;  Laterality: Right;    FAMILY HISTORY: family history includes Diabetes in her father and paternal grandmother; Lung cancer in her maternal aunt and paternal aunt; Pancreatic cancer in her maternal aunt; Prostate cancer in her father.  SOCIAL HISTORY:  reports that she has never smoked. She has never used smokeless tobacco. She reports current alcohol use. She reports current drug use. Drug: Marijuana.  ALLERGIES: Patient has no known allergies.  MEDICATIONS:  Current Outpatient Medications  Medication Sig Dispense Refill   acetaminophen (TYLENOL) 325 MG tablet Take 2 tablets (650 mg total) by mouth every 8 (eight) hours as needed for mild pain. 40 tablet 0   fluticasone (FLONASE) 50 MCG/ACT nasal spray SPRAY 2 SPRAYS INTO EACH NOSTRIL EVERY DAY 16 mL 1   HYDROcodone-acetaminophen (NORCO) 5-325 MG tablet Take 1 tablet by mouth every 6 (six) hours as needed for up to 6 doses for moderate pain. 6 tablet 0   ibuprofen (ADVIL) 800 MG tablet Take 1 tablet (800 mg total) by mouth every 8 (eight) hours as needed for mild pain or moderate pain. 30 tablet 0   levocetirizine (XYZAL) 5 MG tablet TAKE 1 TABLET BY MOUTH EVERY DAY IN THE  EVENING 90 tablet 1   Multiple Vitamin (MULTIVITAMIN WITH MINERALS) TABS tablet Take 1 tablet by mouth daily.     No current facility-administered medications for this encounter.    ECOG PERFORMANCE STATUS:  0 - Asymptomatic  REVIEW OF SYSTEMS: Patient denies any weight loss, fatigue, weakness, fever, chills or night sweats. Patient denies any loss of vision, blurred  vision. Patient denies any ringing  of the ears or hearing loss. No irregular heartbeat. Patient denies heart murmur or history of fainting. Patient denies any chest pain or pain radiating to her upper extremities. Patient denies any shortness of breath, difficulty breathing at night, cough or hemoptysis. Patient denies any swelling in the lower legs. Patient denies any nausea vomiting, vomiting of blood, or coffee ground material in the vomitus. Patient denies any stomach pain. Patient states has had normal bowel movements no significant constipation or diarrhea. Patient denies any dysuria, hematuria or significant nocturia. Patient denies any problems walking, swelling in the joints or loss of balance. Patient denies any skin changes, loss of hair or loss of weight. Patient denies any excessive worrying or anxiety or significant depression. Patient denies any problems with insomnia. Patient denies excessive thirst, polyuria, polydipsia. Patient denies any swollen glands, patient denies easy bruising or easy bleeding. Patient denies any recent infections, allergies or URI. Patient "s visual fields have not changed significantly in recent time.   PHYSICAL EXAM: LMP 02/07/2022 (Approximate)  Moderately obese female in NAD with large volume is breasts.  Some "cauterization artifact is noted at the area of excision.  No dominant masses noted in either breast.  No axillary or supraclavicular adenopathy is identified.  Well-developed well-nourished patient in NAD. HEENT reveals PERLA, EOMI, discs not visualized.  Oral cavity is clear. No oral mucosal lesions are identified. Neck is clear without evidence of cervical or supraclavicular adenopathy. Lungs are clear to A&P. Cardiac examination is essentially unremarkable with regular rate and rhythm without murmur rub or thrill. Abdomen is benign with no organomegaly or masses noted. Motor sensory and DTR levels are equal and symmetric in the upper and lower extremities.  Cranial nerves II through XII are grossly intact. Proprioception is intact. No peripheral adenopathy or edema is identified. No motor or sensory levels are noted. Crude visual fields are within normal range.  LABORATORY DATA: Pathology reports reviewed    RADIOLOGY RESULTS: Mammogram and ultrasound reviewed compatible with above-stated findings   IMPRESSION: Stage 0 ductal carcinoma in situ ER positive in 52 year old female  PLAN: At this time I have recommended whole breast radiation.  Based on her large breast do not think hypofractionated radiation therapy would be viable.  I have recommended whole breast radiation to 5040 cGy in 28 fractions.  Risks and benefits of treatment including skin reaction fatigue alteration of blood counts possible inclusion of superficial lung all were described in detail to the patient.  I have personally set up and ordered CT simulation for early next week.  I like to allow some more healing of her lumpectomy scar.  Patient comprehends my recommendations well.  I would like to take this opportunity to thank you for allowing me to participate in the care of your patient.Noreene Filbert, MD

## 2022-03-22 NOTE — Assessment & Plan Note (Addendum)
Final pathology was discussed with patient.  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 -pTis pN0, ER+ Recommend adjuvant radiation.  Discussed about adjuvant endocrine therapy. - perimenopausal, will consider aromatase inhibitor after she finishes RT

## 2022-03-22 NOTE — Assessment & Plan Note (Signed)
Recommend genetic testing.  She declines.

## 2022-03-22 NOTE — Progress Notes (Signed)
Hematology/Oncology Consult Note [phone] [phone]     REFERRING PROVIDER: Jearld Fenton, NP   Patient Care Team: Jearld Fenton, NP as PCP - General (Internal Medicine) Daiva Huge, RN as Oncology Nurse Navigator   ASSESSMENT & PLAN:   Cancer Staging  DCIS (ductal carcinoma in situ) Staging form: Breast, AJCC 8th Edition - Clinical stage from 02/05/2022: Stage 0 (cTis (DCIS), cN0, cM0, ER: Not Assessed, PR: Not Assessed, HER2: Not Assessed) - Signed by Earlie Server, MD on 03/22/2022 - Pathologic stage from 03/01/2022: pTis (DCIS), pN0, G2, ER+, PR: Not Assessed, HER2: Not Assessed - Signed by Earlie Server, MD on 03/22/2022   DCIS (ductal carcinoma in situ) Final pathology showed intermediate grade DCIS, 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 -pTis pN0, ER+ Recommend adjuvant radiation.  Discussed about adjuvant endocrine therapy. - perimenopausal, will consider aromatase inhibitor after she finishes RT   No orders of the defined types were placed in this encounter.   All questions were answered. The patient knows to call the clinic with any problems, questions or concerns.  Earlie Server, MD, PhD [facility] Hematology Oncology 03/22/2022   CHIEF COMPLAINTS/PURPOSE OF CONSULTATION:  Right breast DCIS  HISTORY OF PRESENTING ILLNESS:  Veronica Chandler 52 y.o. female presents to establish care for DCIS of right breast I have reviewed her chart and materials related to her cancer extensively and collaborated history with the patient. Summary of oncologic history is as follows: Oncology History  DCIS (ductal carcinoma in situ)  12/07/2021 Mammogram   Bilateral screen mammogram showed  In the right breast, calcifications warrant further evaluation with magnified views. In the left breast, no findings suspicious for malignancy   01/11/2022 Imaging   Unilateral right diagnostic mammogram showed . A 17 mm group of calcifications in the upper outer RIGHT breast at middle  depth are indeterminate. Recommend stereotactic guided biopsy for definitive characterization.    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  Pathology showed high grade DCIS, comedo type. Calcifications associated with DCIS.     02/05/2022 Cancer Staging   Staging form: Breast, AJCC 8th Edition - Clinical stage from 02/05/2022: Stage 0 (cTis ([place]), cN0, cM0, ER: Not Assessed, PR: Not Assessed, HER2: Not Assessed) - Signed by Earlie Server, MD on 02/05/2022 Stage prefix: Initial diagnosis Nuclear grade: G3   02/19/2022 Surgery   S/p right lumpecotmy + SLNB  Pathology showed DCIS, intermediate grade, background benign mammary parenchyma with fibrocystic and apocrine changes and fibroadenomatoid changes.  Negative for invasive carcinoma. 2 sentinel lymph nodes were harvested and both were negative for malignancy.   Estrogen receptor positive -90%     Patient is status post lumpectomy.  She presents to discuss pathology results and future management plan.  Denies no new complaints.   MEDICAL HISTORY:  Past Medical History:  Diagnosis Date   Medical history non-contributory    Pre-diabetes     SURGICAL HISTORY: Past Surgical History:  Procedure Laterality Date   BREAST BIOPSY Right 02/01/2022   stereo bx, calcs, "X" clip-DCIS   BREAST BIOPSY Right 02/01/2022   MM RT BREAST BX W LOC DEV 1ST LESION IMAGE BX SPEC STEREO GUIDE 02/01/2022 [facility]   BREAST BIOPSY Right 02/14/2022   MM RT RADIO FREQUENCY TAG LOC MAMMO GUIDE 02/14/2022 [facility]   COLONOSCOPY WITH PROPOFOL N/A 06/16/2020   Procedure: COLONOSCOPY WITH PROPOFOL;  Surgeon: Jonathon Bellows, MD;  Location: [facility]COPY;  Service:  Gastroenterology;  Laterality: N/A;   NO PAST SURGERIES     PART MASTECTOMY,RADIO FREQUENCY LOCALIZER,AXILLARY SENTINEL NODE BIOPSY Right 03/01/2022   Procedure: PART MASTECTOMY,RADIO FREQUENCY  LOCALIZER,AXILLARY SENTINEL NODE BIOPSY;  Surgeon: Benjamine Sprague, DO;  Location: [facility];  Service: General;  Laterality: Right;    SOCIAL HISTORY: Social History   Socioeconomic History   Marital status: Divorced    Spouse name: Not on file   Number of children: Not on file   Years of education: Not on file   Highest education level: Not on file  Occupational History   Not on file  Tobacco Use   Smoking status: Never   Smokeless tobacco: Never  Vaping Use   Vaping Use: Never used  Substance and Sexual Activity   Alcohol use: Yes    Comment: social   Drug use: Yes    Types: Marijuana    Comment: occasional   Sexual activity: Not on file  Other Topics Concern   Not on file  Social History Narrative   Not on file   Social Determinants of Health   Financial Resource Strain: Not on file  Food Insecurity: Not on file  Transportation Needs: Not on file  Physical Activity: Not on file  Stress: Not on file  Social Connections: Not on file  Intimate Partner Violence: Not on file    FAMILY HISTORY: Family History  Problem Relation Age of Onset   Diabetes Father    Prostate cancer Father    Lung cancer Maternal Aunt    Pancreatic cancer Maternal Aunt    Lung cancer Paternal Aunt    Diabetes Paternal Grandmother    Breast cancer Neg Hx    Colon cancer Neg Hx    Ovarian cancer Neg Hx     ALLERGIES:  has No Known Allergies.  MEDICATIONS:  Current Outpatient Medications  Medication Sig Dispense Refill   acetaminophen (TYLENOL) 325 MG tablet Take 2 tablets (650 mg total) by mouth every 8 (eight) hours as needed for mild pain. 40 tablet 0   fluticasone (FLONASE) 50 MCG/ACT nasal spray SPRAY 2 SPRAYS INTO EACH NOSTRIL EVERY DAY 16 mL 1   HYDROcodone-acetaminophen (NORCO) 5-325 MG tablet Take 1 tablet by mouth every 6 (six) hours as needed for up to 6 doses for moderate pain. 6 tablet 0   ibuprofen (ADVIL) 800 MG tablet Take 1 tablet (800 mg total) by mouth every 8  (eight) hours as needed for mild pain or moderate pain. 30 tablet 0   levocetirizine (XYZAL) 5 MG tablet TAKE 1 TABLET BY MOUTH EVERY DAY IN THE EVENING 90 tablet 1   Multiple Vitamin (MULTIVITAMIN WITH MINERALS) TABS tablet Take 1 tablet by mouth daily.     No current facility-administered medications for this visit.    Review of Systems  Constitutional:  Negative for appetite change, chills, fatigue and fever.  HENT:   Negative for hearing loss and voice change.   Eyes:  Negative for eye problems.  Respiratory:  Negative for chest tightness and cough.   Cardiovascular:  Negative for chest pain.  Gastrointestinal:  Negative for abdominal distention, abdominal pain and blood in stool.  Endocrine: Negative for hot flashes.  Genitourinary:  Negative for difficulty urinating and frequency.   Musculoskeletal:  Negative for arthralgias.  Skin:  Negative for itching and rash.  Neurological:  Negative for extremity weakness.  Hematological:  Negative for adenopathy.  Psychiatric/Behavioral:  Negative for confusion.      PHYSICAL EXAMINATION: ECOG PERFORMANCE STATUS:  0 - Asymptomatic  Vitals:   03/22/22 1102  BP: (!) 173/105  Pulse: (!) 112  Temp: 97.7 F (36.5 C)  SpO2: 98%   Filed Weights   03/22/22 1102  Weight: 270 lb 12.8 oz (122.8 kg)    Physical Exam Constitutional:      General: She is not in acute distress.    Appearance: She is not diaphoretic.  HENT:     Head: Normocephalic and atraumatic.     Nose: Nose normal.     Mouth/Throat:     Pharynx: No oropharyngeal exudate.  Eyes:     General: No scleral icterus.    Pupils: Pupils are equal, round, and reactive to light.  Cardiovascular:     Rate and Rhythm: Normal rate and regular rhythm.     Heart sounds: No murmur heard. Pulmonary:     Effort: Pulmonary effort is normal. No respiratory distress.     Breath sounds: No rales.  Chest:     Chest wall: No tenderness.  Abdominal:     General: There is no  distension.     Palpations: Abdomen is soft.     Tenderness: There is no abdominal tenderness.  Musculoskeletal:        General: Normal range of motion.     Cervical back: Normal range of motion and neck supple.  Skin:    General: Skin is warm and dry.     Findings: No erythema.  Neurological:     Mental Status: She is alert and oriented to person, place, and time.     Cranial Nerves: No cranial nerve deficit.     Motor: No abnormal muscle tone.     Coordination: Coordination normal.  Psychiatric:        Mood and Affect: Mood and affect normal.      LABORATORY DATA:  I have reviewed the data as listed    Latest Ref Rng & Units 02/05/2022    3:49 PM 07/05/2021   10:06 AM 12/21/2020   10:38 AM  CBC  WBC 4.0 - 10.5 K/uL 6.7  5.2  5.4   Hemoglobin 12.0 - 15.0 g/dL 12.8  12.5  12.3   Hematocrit 36.0 - 46.0 % 36.6  37.0  36.7   Platelets 150 - 400 K/uL 312  322  291       Latest Ref Rng & Units 02/05/2022    3:49 PM 07/05/2021   10:06 AM 12/21/2020   10:38 AM  CMP  Glucose 70 - 99 mg/dL 164  112  120   BUN 6 - 20 mg/dL '9  14  10   '$ Creatinine 0.44 - 1.00 mg/dL 0.88  0.85  0.85   Sodium 135 - 145 mmol/L 137  139  138   Potassium 3.5 - 5.1 mmol/L 3.8  4.4  4.0   Chloride 98 - 111 mmol/L 103  102  101   CO2 22 - 32 mmol/L '26  27  30   '$ Calcium 8.9 - 10.3 mg/dL 8.8  9.6  9.5   Total Protein 6.5 - 8.1 g/dL 7.9  7.6  7.2   Total Bilirubin 0.3 - 1.2 mg/dL 0.3  0.4  0.4   Alkaline Phos 38 - 126 U/L 76     AST 15 - 41 U/L '17  15  13   '$ ALT 0 - 44 U/L '16  14  13      '$ RADIOGRAPHIC STUDIES: I have personally reviewed the radiological images as listed and agreed  with the findings in the report. MM Breast Surgical Specimen  Result Date: 03/01/2022 CLINICAL DATA:  Status post Magseed localized RIGHT breast lumpectomy. History of a stereotactic biopsy of calcifications demonstrating ductal carcinoma in-situ (X clip) EXAM: SPECIMEN RADIOGRAPH OF THE RIGHT BREAST COMPARISON:  Previous  exam(s). FINDINGS: Status post excision of the RIGHT breast. The Magseed and X shaped clip are present within the specimen. IMPRESSION: Specimen radiograph of the RIGHT breast. Electronically Signed   By: Valentino Saxon M.D.   On: 03/01/2022 10:15  NM Sentinel Node Inj-No Rpt (Breast)  Result Date: 03/01/2022 Sulfur Colloid was injected by the Nuclear Medicine Technologist for sentinel lymph node localization.

## 2022-03-27 ENCOUNTER — Encounter: Payer: Self-pay | Admitting: *Deleted

## 2022-03-27 ENCOUNTER — Ambulatory Visit
Admission: RE | Admit: 2022-03-27 | Discharge: 2022-03-27 | Disposition: A | Discharge status: Home / Self Care | Payer: Commercial Managed Care - PPO | Source: Ambulatory Visit | Attending: Radiation Oncology | Admitting: Radiation Oncology

## 2022-03-27 DIAGNOSIS — D0511 Intraductal carcinoma in situ of right breast: Secondary | ICD-10-CM | POA: Diagnosis not present

## 2022-04-02 DIAGNOSIS — D0511 Intraductal carcinoma in situ of right breast: Secondary | ICD-10-CM | POA: Diagnosis not present

## 2022-04-04 ENCOUNTER — Ambulatory Visit: Admission: RE | Admit: 2022-04-04 | Payer: Commercial Managed Care - PPO | Source: Ambulatory Visit

## 2022-04-04 DIAGNOSIS — D0511 Intraductal carcinoma in situ of right breast: Secondary | ICD-10-CM | POA: Diagnosis not present

## 2022-04-05 ENCOUNTER — Other Ambulatory Visit: Payer: Self-pay

## 2022-04-05 ENCOUNTER — Ambulatory Visit
Admission: RE | Admit: 2022-04-05 | Discharge: 2022-04-05 | Disposition: A | Discharge status: Home / Self Care | Payer: Commercial Managed Care - PPO | Source: Ambulatory Visit | Attending: Radiation Oncology | Admitting: Radiation Oncology

## 2022-04-05 DIAGNOSIS — D0511 Intraductal carcinoma in situ of right breast: Secondary | ICD-10-CM | POA: Insufficient documentation

## 2022-04-05 DIAGNOSIS — Z510 Encounter for antineoplastic radiation therapy: Secondary | ICD-10-CM | POA: Diagnosis not present

## 2022-04-05 LAB — RAD ONC ARIA SESSION SUMMARY
Course Elapsed Days: 0
Plan Fractions Treated to Date: 1
Plan Prescribed Dose Per Fraction: 1.8 Gy
Plan Total Fractions Prescribed: 28
Plan Total Prescribed Dose: 50.4 Gy
Reference Point Dosage Given to Date: 1.8 Gy
Reference Point Session Dosage Given: 1.8 Gy
Session Number: 1

## 2022-04-06 ENCOUNTER — Other Ambulatory Visit: Payer: Self-pay

## 2022-04-06 ENCOUNTER — Ambulatory Visit
Admission: RE | Admit: 2022-04-06 | Discharge: 2022-04-06 | Disposition: A | Discharge status: Home / Self Care | Payer: Commercial Managed Care - PPO | Source: Ambulatory Visit | Attending: Radiation Oncology | Admitting: Radiation Oncology

## 2022-04-06 ENCOUNTER — Other Ambulatory Visit: Payer: Self-pay | Admitting: *Deleted

## 2022-04-06 DIAGNOSIS — Z510 Encounter for antineoplastic radiation therapy: Secondary | ICD-10-CM | POA: Diagnosis not present

## 2022-04-06 DIAGNOSIS — C50411 Malignant neoplasm of upper-outer quadrant of right female breast: Secondary | ICD-10-CM

## 2022-04-06 LAB — RAD ONC ARIA SESSION SUMMARY
Course Elapsed Days: 1
Plan Fractions Treated to Date: 2
Plan Prescribed Dose Per Fraction: 1.8 Gy
Plan Total Fractions Prescribed: 28
Plan Total Prescribed Dose: 50.4 Gy
Reference Point Dosage Given to Date: 3.6 Gy
Reference Point Session Dosage Given: 1.8 Gy
Session Number: 2

## 2022-04-08 ENCOUNTER — Other Ambulatory Visit: Payer: Self-pay | Admitting: Internal Medicine

## 2022-04-09 ENCOUNTER — Ambulatory Visit
Admission: RE | Admit: 2022-04-09 | Discharge: 2022-04-09 | Disposition: A | Discharge status: Home / Self Care | Payer: Commercial Managed Care - PPO | Source: Ambulatory Visit | Attending: Radiation Oncology | Admitting: Radiation Oncology

## 2022-04-09 ENCOUNTER — Other Ambulatory Visit: Payer: Self-pay

## 2022-04-09 ENCOUNTER — Telehealth: Payer: Self-pay | Admitting: *Deleted

## 2022-04-09 DIAGNOSIS — Z510 Encounter for antineoplastic radiation therapy: Secondary | ICD-10-CM | POA: Diagnosis not present

## 2022-04-09 LAB — RAD ONC ARIA SESSION SUMMARY
Course Elapsed Days: 4
Plan Fractions Treated to Date: 3
Plan Prescribed Dose Per Fraction: 1.8 Gy
Plan Total Fractions Prescribed: 28
Plan Total Prescribed Dose: 50.4 Gy
Reference Point Dosage Given to Date: 5.4 Gy
Reference Point Session Dosage Given: 1.8 Gy
Session Number: 3

## 2022-04-09 NOTE — Telephone Encounter (Signed)
Requested Prescriptions  Pending Prescriptions Disp Refills   fluticasone (FLONASE) 50 MCG/ACT nasal spray [Pharmacy Med Name: FLUTICASONE PROP 50 MCG SPRAY] 16 mL 0    Sig: SPRAY 2 SPRAYS INTO EACH NOSTRIL EVERY DAY     Ear, Nose, and Throat: Nasal Preparations - Corticosteroids Passed - 04/08/2022  8:30 AM      Passed - Valid encounter within last 12 months    Recent Outpatient Visits           4 months ago Screening for cervical cancer   [facility] [place], Coralie Keens, NP   9 months ago Encounter for general adult medical examination with abnormal findings   [facility] [place], Coralie Keens, NP   1 year ago RLQ abdominal pain   [facility] [place], [place] W, NP   1 year ago Type 2 diabetes mellitus with hyperglycemia, without long-term current use of insulin [facility])   [facility] [place], Coralie Keens, [place]

## 2022-04-09 NOTE — Telephone Encounter (Signed)
Received FMLA form, Completed and sent for doctor signature

## 2022-04-10 ENCOUNTER — Ambulatory Visit
Admission: RE | Admit: 2022-04-10 | Discharge: 2022-04-10 | Disposition: A | Discharge status: Home / Self Care | Payer: Commercial Managed Care - PPO | Source: Ambulatory Visit | Attending: Radiation Oncology | Admitting: Radiation Oncology

## 2022-04-10 ENCOUNTER — Other Ambulatory Visit: Payer: Self-pay

## 2022-04-10 DIAGNOSIS — Z510 Encounter for antineoplastic radiation therapy: Secondary | ICD-10-CM | POA: Diagnosis not present

## 2022-04-10 LAB — RAD ONC ARIA SESSION SUMMARY
Course Elapsed Days: 5
Plan Fractions Treated to Date: 4
Plan Prescribed Dose Per Fraction: 1.8 Gy
Plan Total Fractions Prescribed: 28
Plan Total Prescribed Dose: 50.4 Gy
Reference Point Dosage Given to Date: 7.2 Gy
Reference Point Session Dosage Given: 1.8 Gy
Session Number: 4

## 2022-04-11 ENCOUNTER — Other Ambulatory Visit: Payer: Self-pay

## 2022-04-11 ENCOUNTER — Ambulatory Visit
Admission: RE | Admit: 2022-04-11 | Discharge: 2022-04-11 | Disposition: A | Discharge status: Home / Self Care | Payer: Commercial Managed Care - PPO | Source: Ambulatory Visit | Attending: Radiation Oncology | Admitting: Radiation Oncology

## 2022-04-11 DIAGNOSIS — Z510 Encounter for antineoplastic radiation therapy: Secondary | ICD-10-CM | POA: Diagnosis not present

## 2022-04-11 LAB — RAD ONC ARIA SESSION SUMMARY
Course Elapsed Days: 6
Plan Fractions Treated to Date: 5
Plan Prescribed Dose Per Fraction: 1.8 Gy
Plan Total Fractions Prescribed: 28
Plan Total Prescribed Dose: 50.4 Gy
Reference Point Dosage Given to Date: 9 Gy
Reference Point Session Dosage Given: 1.8 Gy
Session Number: 5

## 2022-04-12 ENCOUNTER — Ambulatory Visit
Admission: RE | Admit: 2022-04-12 | Discharge: 2022-04-12 | Disposition: A | Discharge status: Home / Self Care | Payer: Commercial Managed Care - PPO | Source: Ambulatory Visit | Attending: Radiation Oncology | Admitting: Radiation Oncology

## 2022-04-12 ENCOUNTER — Inpatient Hospital Stay: Payer: Commercial Managed Care - PPO | Attending: Oncology

## 2022-04-12 ENCOUNTER — Other Ambulatory Visit: Payer: Self-pay

## 2022-04-12 DIAGNOSIS — D0511 Intraductal carcinoma in situ of right breast: Secondary | ICD-10-CM | POA: Insufficient documentation

## 2022-04-12 DIAGNOSIS — Z510 Encounter for antineoplastic radiation therapy: Secondary | ICD-10-CM | POA: Diagnosis not present

## 2022-04-12 LAB — RAD ONC ARIA SESSION SUMMARY
Course Elapsed Days: 7
Plan Fractions Treated to Date: 6
Plan Prescribed Dose Per Fraction: 1.8 Gy
Plan Total Fractions Prescribed: 28
Plan Total Prescribed Dose: 50.4 Gy
Reference Point Dosage Given to Date: 10.8 Gy
Reference Point Session Dosage Given: 1.8 Gy
Session Number: 6

## 2022-04-12 NOTE — Telephone Encounter (Signed)
Form completed and copied for chart, patient notified that she can pick up form at front desk

## 2022-04-13 ENCOUNTER — Ambulatory Visit
Admission: RE | Admit: 2022-04-13 | Discharge: 2022-04-13 | Disposition: A | Discharge status: Home / Self Care | Payer: Commercial Managed Care - PPO | Source: Ambulatory Visit | Attending: Radiation Oncology | Admitting: Radiation Oncology

## 2022-04-13 ENCOUNTER — Other Ambulatory Visit: Payer: Self-pay

## 2022-04-13 DIAGNOSIS — Z510 Encounter for antineoplastic radiation therapy: Secondary | ICD-10-CM | POA: Diagnosis not present

## 2022-04-13 LAB — RAD ONC ARIA SESSION SUMMARY
Course Elapsed Days: 8
Plan Fractions Treated to Date: 7
Plan Prescribed Dose Per Fraction: 1.8 Gy
Plan Total Fractions Prescribed: 28
Plan Total Prescribed Dose: 50.4 Gy
Reference Point Dosage Given to Date: 12.6 Gy
Reference Point Session Dosage Given: 1.8 Gy
Session Number: 7

## 2022-04-16 ENCOUNTER — Encounter: Payer: Self-pay | Admitting: Physician Assistant

## 2022-04-16 ENCOUNTER — Ambulatory Visit (INDEPENDENT_AMBULATORY_CARE_PROVIDER_SITE_OTHER): Payer: Commercial Managed Care - PPO | Admitting: Physician Assistant

## 2022-04-16 ENCOUNTER — Ambulatory Visit
Admission: RE | Admit: 2022-04-16 | Discharge: 2022-04-16 | Disposition: A | Discharge status: Home / Self Care | Payer: Commercial Managed Care - PPO | Source: Ambulatory Visit | Attending: Radiation Oncology | Admitting: Radiation Oncology

## 2022-04-16 ENCOUNTER — Ambulatory Visit: Payer: Self-pay | Admitting: *Deleted

## 2022-04-16 ENCOUNTER — Other Ambulatory Visit: Payer: Self-pay

## 2022-04-16 ENCOUNTER — Other Ambulatory Visit (HOSPITAL_COMMUNITY)
Admission: RE | Admit: 2022-04-16 | Discharge: 2022-04-16 | Disposition: A | Discharge status: Home / Self Care | Payer: Commercial Managed Care - PPO | Source: Ambulatory Visit | Attending: Physician Assistant | Admitting: Physician Assistant

## 2022-04-16 VITALS — BP 138/86 | HR 93 | Temp 96.8°F | Wt 262.0 lb

## 2022-04-16 DIAGNOSIS — R300 Dysuria: Secondary | ICD-10-CM

## 2022-04-16 DIAGNOSIS — B9689 Other specified bacterial agents as the cause of diseases classified elsewhere: Secondary | ICD-10-CM

## 2022-04-16 DIAGNOSIS — B3731 Acute candidiasis of vulva and vagina: Secondary | ICD-10-CM

## 2022-04-16 DIAGNOSIS — Z510 Encounter for antineoplastic radiation therapy: Secondary | ICD-10-CM | POA: Diagnosis not present

## 2022-04-16 DIAGNOSIS — N760 Acute vaginitis: Secondary | ICD-10-CM | POA: Diagnosis not present

## 2022-04-16 LAB — POCT URINALYSIS DIPSTICK
Bilirubin, UA: NEGATIVE
Blood, UA: NEGATIVE
Glucose, UA: POSITIVE — AB
Ketones, UA: NEGATIVE
Leukocytes, UA: NEGATIVE
Nitrite, UA: NEGATIVE
Protein, UA: NEGATIVE
Spec Grav, UA: <=1.005 — AB (ref 1.010–1.025)
Urobilinogen, UA: 0.2 E.U./dL
pH, UA: 6.5 (ref 5.0–8.0)

## 2022-04-16 LAB — RAD ONC ARIA SESSION SUMMARY
Course Elapsed Days: 11
Plan Fractions Treated to Date: 8
Plan Prescribed Dose Per Fraction: 1.8 Gy
Plan Total Fractions Prescribed: 28
Plan Total Prescribed Dose: 50.4 Gy
Reference Point Dosage Given to Date: 14.4 Gy
Reference Point Session Dosage Given: 1.8 Gy
Session Number: 8

## 2022-04-16 MED ORDER — FLUCONAZOLE 150 MG PO TABS: 150.0000 mg | ORAL_TABLET | Freq: Once | ORAL | 0 refills | Status: AC

## 2022-04-16 NOTE — Telephone Encounter (Signed)
  Chief Complaint: UTI, vomiting Symptoms: Frequency, urgency, dysuria, blood with wiping only. Two episodes of vomiting this AM Frequency: Thursday Pertinent Negatives: Patient denies fever Disposition: '[]'$ ED /'[]'$ Urgent Care (no appt availability in office) / '[x]'$ Appointment(In office/virtual)/ '[]'$  [facility]/ '[]'$ Home Care/ '[]'$ Refused Recommended Disposition /'[]'$ [facility]/ '[]'$  Follow-up with PCP Additional Notes: Appt secured for this AM. Care advise provided, pt verbalizes understanding.  Reason for Disposition  Urinating more frequently than usual (i.e., frequency)  Answer Assessment - Initial Assessment Questions 1. SYMPTOM: "What's the main symptom you're concerned about?" (e.g., frequency, incontinence)     Hematuria, frequency, urgency 2. ONSET: "When did the    start?"     Thursday 3. PAIN: "Is there any pain?" If Yes, ask: "How bad is it?" (Scale: 1-10; mild, moderate, severe)     Moderate 4. CAUSE: "What do you think is causing the symptoms?"     UTI 5. OTHER SYMPTOMS: "Do you have any other symptoms?" (e.g., blood in urine, fever, flank pain, pain with urination)     Blood with wiping, 2 episodes of vomiting this AM  Protocols used: Urinary Symptoms-A-AH

## 2022-04-16 NOTE — Progress Notes (Signed)
Acute Office Visit   Patient: Veronica Chandler   DOB: 1971-01-31   52 y.o. Female  MRN: [id number] Visit Date: 04/16/2022  Today's healthcare provider: Dani Gobble Tysen Roesler, PA-C  Introduced myself to the patient as a Journalist, newspaper and provided education on APPs in clinical practice.    Chief Complaint  Patient presents with   Urinary Tract Infection    She states she has had pain with urination since Thursday    Subjective    HPI HPI     Urinary Tract Infection    Additional comments: She states she has had pain with urination since Thursday       Last edited by Merritt Kibby, Dani Gobble, PA-C on 04/16/2022 11:06 AM.       She reports she is having pain and burning with urination  Ongoing since Thursday She has noted some blood when she wipes  Interventions: AZO at home but this has not improved her symptoms at all  She states she has noticed her genital area is swollen and itching but denies vaginal bleeding or pain  She reports some white vaginal discharge which is not her normal   Medications: Outpatient Medications Prior to Visit  Medication Sig   fluticasone (FLONASE) 50 MCG/ACT nasal spray SPRAY 2 SPRAYS INTO EACH NOSTRIL EVERY DAY   ibuprofen (ADVIL) 800 MG tablet Take 1 tablet (800 mg total) by mouth every 8 (eight) hours as needed for mild pain or moderate pain.   levocetirizine (XYZAL) 5 MG tablet TAKE 1 TABLET BY MOUTH EVERY DAY IN THE EVENING   Multiple Vitamin (MULTIVITAMIN WITH MINERALS) TABS tablet Take 1 tablet by mouth daily.   HYDROcodone-acetaminophen (NORCO) 5-325 MG tablet Take 1 tablet by mouth every 6 (six) hours as needed for up to 6 doses for moderate pain. (Patient not taking: Reported on 04/16/2022)   No facility-administered medications prior to visit.    Review of Systems  Constitutional:  Negative for chills and fever.  Gastrointestinal:  Positive for nausea. Negative for abdominal pain.  Genitourinary:  Positive for dysuria, frequency, hematuria,  urgency and vaginal discharge. Negative for decreased urine volume, difficulty urinating, enuresis, flank pain, vaginal bleeding and vaginal pain.       She reports some vulvovaginal swelling and itching        Objective    BP 138/86 (BP Location: Left Arm, Patient Position: Sitting, Cuff Size: Large)   Pulse 93   Temp (!) 96.8 F (36 C) (Temporal)   Wt 262 lb (118.8 kg)   SpO2 97%   BMI 51.17 kg/m    Physical Exam Vitals reviewed.  Constitutional:      General: She is awake.     Appearance: Normal appearance. She is well-developed and well-groomed.  HENT:     Head: Normocephalic and atraumatic.  Eyes:     Extraocular Movements: Extraocular movements intact.     Conjunctiva/sclera: Conjunctivae normal.  Pulmonary:     Effort: Pulmonary effort is normal.  Abdominal:     General: Abdomen is flat. Bowel sounds are normal.     Palpations: Abdomen is soft.     Tenderness: There is no abdominal tenderness. There is no right CVA tenderness or left CVA tenderness.  Musculoskeletal:     Cervical back: Normal range of motion.  Neurological:     General: No focal deficit present.     Mental Status: She is alert and oriented to person, place, and time. Mental status  is at baseline.  Psychiatric:        Mood and Affect: Mood normal.        Behavior: Behavior normal. Behavior is cooperative.        Thought Content: Thought content normal.        Judgment: Judgment normal.       Results for orders placed or performed in visit on 04/16/22  POCT urinalysis dipstick  Result Value Ref Range   Color, UA     Clarity, UA     Glucose, UA Positive (A) Negative   Bilirubin, UA Negative    Ketones, UA Negative    Spec Grav, UA <=1.005 (A) 1.010 - 1.025   Blood, UA Negative    pH, UA 6.5 5.0 - 8.0   Protein, UA Negative Negative   Urobilinogen, UA 0.2 0.2 or 1.0 E.U./dL   Nitrite, UA Negative    Leukocytes, UA Negative Negative   Appearance     Odor    Results for orders placed  or performed in visit on 04/16/22  Rad Onc Aria Session Summary  Result Value Ref Range   Course ID C1_Breast    Course Intent Unknown    Course Start Date 03/27/2022  2:03 PM    Session Number 8    Course First Treatment Date 04/05/2022  9:56 AM    Course Last Treatment Date 04/16/2022  9:58 AM    Course Elapsed Days 11    Reference Point ID Rt Breast DP    Reference Point Dosage Given to Date 14.4 Gy   Reference Point Session Dosage Given 1.8 Gy   Plan ID Breast_R    Plan Fractions Treated to Date 8    Plan Total Fractions Prescribed 28    Plan Prescribed Dose Per Fraction 1.8 Gy   Plan Total Prescribed Dose 50.400000 Gy   Plan Primary Reference Point Rt Breast DP     Assessment & Plan      No follow-ups on file.      Problem List Items Addressed This Visit   None Visit Diagnoses     Vaginal yeast infection    -  Primary Acute, new concern Reports pain with urination, genital itching and whitish vaginal discharge since Thurs UA was positive for glucose but otherwise normal, will send for culture for more definitive rule out- results of UA reviewed with patient during apt. Awaiting results of cervicovaginal swab  Will send in script for Diflucan single dose to assist with symptoms while waiting for results Follow up as needed for persistent or progressing symptoms    Relevant Medications   fluconazole (DIFLUCAN) 150 MG tablet   Dysuria       Relevant Orders   Cervicovaginal ancillary only   POCT urinalysis dipstick (Completed)   Urine Culture        No follow-ups on file.   I, Timarion Agcaoili E Samaya Boardley, PA-C, have reviewed all documentation for this visit. The documentation on 04/16/22 for the exam, diagnosis, procedures, and orders are all accurate and complete.   Talitha Givens, MHS, PA-C [facility]

## 2022-04-17 ENCOUNTER — Other Ambulatory Visit: Payer: Self-pay

## 2022-04-17 ENCOUNTER — Other Ambulatory Visit: Payer: Self-pay | Admitting: *Deleted

## 2022-04-17 ENCOUNTER — Ambulatory Visit
Admission: RE | Admit: 2022-04-17 | Discharge: 2022-04-17 | Disposition: A | Discharge status: Home / Self Care | Payer: Commercial Managed Care - PPO | Source: Ambulatory Visit | Attending: Radiation Oncology | Admitting: Radiation Oncology

## 2022-04-17 ENCOUNTER — Telehealth: Payer: Self-pay | Admitting: *Deleted

## 2022-04-17 ENCOUNTER — Inpatient Hospital Stay: Payer: Commercial Managed Care - PPO

## 2022-04-17 DIAGNOSIS — Z510 Encounter for antineoplastic radiation therapy: Secondary | ICD-10-CM | POA: Diagnosis not present

## 2022-04-17 DIAGNOSIS — Z170 Estrogen receptor positive status [ER+]: Secondary | ICD-10-CM

## 2022-04-17 LAB — RAD ONC ARIA SESSION SUMMARY
Course Elapsed Days: 12
Plan Fractions Treated to Date: 9
Plan Prescribed Dose Per Fraction: 1.8 Gy
Plan Total Fractions Prescribed: 28
Plan Total Prescribed Dose: 50.4 Gy
Reference Point Dosage Given to Date: 16.2 Gy
Reference Point Session Dosage Given: 1.8 Gy
Session Number: 9

## 2022-04-17 LAB — URINE CULTURE
MICRO NUMBER:: 14552538
SPECIMEN QUALITY:: ADEQUATE

## 2022-04-17 LAB — PREGNANCY, URINE: Preg Test, Ur: NEGATIVE

## 2022-04-17 NOTE — Telephone Encounter (Signed)
Called and left patient a message that her pregnancy test was negative ad that she could resume her  treatments tomorrow

## 2022-04-18 ENCOUNTER — Ambulatory Visit
Admission: RE | Admit: 2022-04-18 | Discharge: 2022-04-18 | Disposition: A | Discharge status: Home / Self Care | Payer: Commercial Managed Care - PPO | Source: Ambulatory Visit | Attending: Radiation Oncology | Admitting: Radiation Oncology

## 2022-04-18 ENCOUNTER — Other Ambulatory Visit: Payer: Self-pay

## 2022-04-18 DIAGNOSIS — Z510 Encounter for antineoplastic radiation therapy: Secondary | ICD-10-CM | POA: Diagnosis not present

## 2022-04-18 LAB — CERVICOVAGINAL ANCILLARY ONLY
Bacterial Vaginitis (gardnerella): POSITIVE — AB
Candida Glabrata: NEGATIVE
Candida Vaginitis: POSITIVE — AB
Chlamydia: NEGATIVE
Comment: NEGATIVE
Comment: NEGATIVE
Comment: NEGATIVE
Comment: NEGATIVE
Comment: NEGATIVE
Comment: NORMAL
Neisseria Gonorrhea: NEGATIVE
Trichomonas: NEGATIVE

## 2022-04-18 LAB — RAD ONC ARIA SESSION SUMMARY
Course Elapsed Days: 13
Plan Fractions Treated to Date: 10
Plan Prescribed Dose Per Fraction: 1.8 Gy
Plan Total Fractions Prescribed: 28
Plan Total Prescribed Dose: 50.4 Gy
Reference Point Dosage Given to Date: 18 Gy
Reference Point Session Dosage Given: 1.8 Gy
Session Number: 10

## 2022-04-19 ENCOUNTER — Ambulatory Visit
Admission: RE | Admit: 2022-04-19 | Discharge: 2022-04-19 | Disposition: A | Discharge status: Home / Self Care | Payer: Commercial Managed Care - PPO | Source: Ambulatory Visit | Attending: Radiation Oncology | Admitting: Radiation Oncology

## 2022-04-19 ENCOUNTER — Other Ambulatory Visit: Payer: Self-pay

## 2022-04-19 DIAGNOSIS — Z510 Encounter for antineoplastic radiation therapy: Secondary | ICD-10-CM | POA: Diagnosis not present

## 2022-04-19 LAB — RAD ONC ARIA SESSION SUMMARY
Course Elapsed Days: 14
Plan Fractions Treated to Date: 11
Plan Prescribed Dose Per Fraction: 1.8 Gy
Plan Total Fractions Prescribed: 28
Plan Total Prescribed Dose: 50.4 Gy
Reference Point Dosage Given to Date: 19.8 Gy
Reference Point Session Dosage Given: 1.8 Gy
Session Number: 11

## 2022-04-19 MED ORDER — METRONIDAZOLE 500 MG PO TABS: 500.0000 mg | ORAL_TABLET | Freq: Two times a day (BID) | ORAL | 0 refills | Status: DC

## 2022-04-19 MED ORDER — FLUCONAZOLE 150 MG PO TABS: 150.0000 mg | ORAL_TABLET | Freq: Once | ORAL | 0 refills | Status: AC

## 2022-04-19 NOTE — Addendum Note (Signed)
Addended by: Talitha Givens on: 04/19/2022 02:36 PM   Modules accepted: Orders

## 2022-04-19 NOTE — Progress Notes (Signed)
Your cervicovaginal swab shows that you have bacterial vaginosis (BV) and a yeast infection which is likely causing your symptoms. The Diflucan should have sufficiently treated the yeast infection but you will need another medication, Flagyl, to treat the BV. I have sent in a script for this as well as another Diflucan pill to make sure that doesn't return while you are taking the Flagyl. Flagyl, when mixed with alcohol consumption, can cause severe nausea and vomiting so please refrain from this will taking the Flagyl. Let us know if you have further questions or concerns.

## 2022-04-20 ENCOUNTER — Other Ambulatory Visit: Payer: Self-pay

## 2022-04-20 ENCOUNTER — Ambulatory Visit
Admission: RE | Admit: 2022-04-20 | Discharge: 2022-04-20 | Disposition: A | Discharge status: Home / Self Care | Payer: Commercial Managed Care - PPO | Source: Ambulatory Visit | Attending: Radiation Oncology | Admitting: Radiation Oncology

## 2022-04-20 DIAGNOSIS — Z510 Encounter for antineoplastic radiation therapy: Secondary | ICD-10-CM | POA: Diagnosis not present

## 2022-04-20 LAB — RAD ONC ARIA SESSION SUMMARY
Course Elapsed Days: 15
Plan Fractions Treated to Date: 12
Plan Prescribed Dose Per Fraction: 1.8 Gy
Plan Total Fractions Prescribed: 28
Plan Total Prescribed Dose: 50.4 Gy
Reference Point Dosage Given to Date: 21.6 Gy
Reference Point Session Dosage Given: 1.8 Gy
Session Number: 12

## 2022-04-23 ENCOUNTER — Other Ambulatory Visit: Payer: Self-pay

## 2022-04-23 ENCOUNTER — Ambulatory Visit
Admission: RE | Admit: 2022-04-23 | Discharge: 2022-04-23 | Disposition: A | Discharge status: Home / Self Care | Payer: Commercial Managed Care - PPO | Source: Ambulatory Visit | Attending: Radiation Oncology | Admitting: Radiation Oncology

## 2022-04-23 DIAGNOSIS — Z510 Encounter for antineoplastic radiation therapy: Secondary | ICD-10-CM | POA: Diagnosis not present

## 2022-04-23 LAB — RAD ONC ARIA SESSION SUMMARY
Course Elapsed Days: 18
Plan Fractions Treated to Date: 13
Plan Prescribed Dose Per Fraction: 1.8 Gy
Plan Total Fractions Prescribed: 28
Plan Total Prescribed Dose: 50.4 Gy
Reference Point Dosage Given to Date: 23.4 Gy
Reference Point Session Dosage Given: 1.8 Gy
Session Number: 13

## 2022-04-24 ENCOUNTER — Other Ambulatory Visit: Payer: Self-pay

## 2022-04-24 ENCOUNTER — Other Ambulatory Visit: Payer: Self-pay | Admitting: *Deleted

## 2022-04-24 ENCOUNTER — Ambulatory Visit
Admission: RE | Admit: 2022-04-24 | Discharge: 2022-04-24 | Disposition: A | Discharge status: Home / Self Care | Payer: Commercial Managed Care - PPO | Source: Ambulatory Visit | Attending: Radiation Oncology | Admitting: Radiation Oncology

## 2022-04-24 DIAGNOSIS — Z510 Encounter for antineoplastic radiation therapy: Secondary | ICD-10-CM | POA: Diagnosis not present

## 2022-04-24 LAB — RAD ONC ARIA SESSION SUMMARY
Course Elapsed Days: 19
Plan Fractions Treated to Date: 14
Plan Prescribed Dose Per Fraction: 1.8 Gy
Plan Total Fractions Prescribed: 28
Plan Total Prescribed Dose: 50.4 Gy
Reference Point Dosage Given to Date: 25.2 Gy
Reference Point Session Dosage Given: 1.8 Gy
Session Number: 14

## 2022-04-24 MED ORDER — SUCRALFATE 1 G PO TABS: 1.0000 g | ORAL_TABLET | Freq: Three times a day (TID) | ORAL | 0 refills | Status: DC

## 2022-04-25 ENCOUNTER — Ambulatory Visit
Admission: RE | Admit: 2022-04-25 | Discharge: 2022-04-25 | Disposition: A | Discharge status: Home / Self Care | Payer: Commercial Managed Care - PPO | Source: Ambulatory Visit | Attending: Radiation Oncology | Admitting: Radiation Oncology

## 2022-04-25 ENCOUNTER — Other Ambulatory Visit: Payer: Self-pay

## 2022-04-25 DIAGNOSIS — Z510 Encounter for antineoplastic radiation therapy: Secondary | ICD-10-CM | POA: Diagnosis not present

## 2022-04-25 LAB — RAD ONC ARIA SESSION SUMMARY
Course Elapsed Days: 20
Plan Fractions Treated to Date: 15
Plan Prescribed Dose Per Fraction: 1.8 Gy
Plan Total Fractions Prescribed: 28
Plan Total Prescribed Dose: 50.4 Gy
Reference Point Dosage Given to Date: 27 Gy
Reference Point Session Dosage Given: 1.8 Gy
Session Number: 15

## 2022-04-26 ENCOUNTER — Other Ambulatory Visit: Payer: Self-pay

## 2022-04-26 ENCOUNTER — Inpatient Hospital Stay: Payer: Commercial Managed Care - PPO

## 2022-04-26 ENCOUNTER — Ambulatory Visit: Payer: Commercial Managed Care - PPO

## 2022-04-26 ENCOUNTER — Inpatient Hospital Stay
Admission: EM | Admit: 2022-04-26 | Discharge: 2022-04-29 | DRG: 638 | Disposition: A | Discharge status: Home Health | Payer: Commercial Managed Care - PPO | Attending: Student | Admitting: Student

## 2022-04-26 DIAGNOSIS — D72828 Other elevated white blood cell count: Secondary | ICD-10-CM | POA: Diagnosis present

## 2022-04-26 DIAGNOSIS — Z1152 Encounter for screening for COVID-19: Secondary | ICD-10-CM | POA: Diagnosis not present

## 2022-04-26 DIAGNOSIS — I951 Orthostatic hypotension: Secondary | ICD-10-CM | POA: Diagnosis present

## 2022-04-26 DIAGNOSIS — Z170 Estrogen receptor positive status [ER+]: Secondary | ICD-10-CM | POA: Diagnosis not present

## 2022-04-26 DIAGNOSIS — E1110 Type 2 diabetes mellitus with ketoacidosis without coma: Secondary | ICD-10-CM | POA: Diagnosis present

## 2022-04-26 DIAGNOSIS — Z800 Family history of malignant neoplasm of digestive organs: Secondary | ICD-10-CM

## 2022-04-26 DIAGNOSIS — N760 Acute vaginitis: Secondary | ICD-10-CM | POA: Diagnosis present

## 2022-04-26 DIAGNOSIS — D0510 Intraductal carcinoma in situ of unspecified breast: Secondary | ICD-10-CM | POA: Diagnosis present

## 2022-04-26 DIAGNOSIS — I493 Ventricular premature depolarization: Secondary | ICD-10-CM | POA: Diagnosis present

## 2022-04-26 DIAGNOSIS — D72829 Elevated white blood cell count, unspecified: Secondary | ICD-10-CM | POA: Diagnosis present

## 2022-04-26 DIAGNOSIS — Z801 Family history of malignant neoplasm of trachea, bronchus and lung: Secondary | ICD-10-CM

## 2022-04-26 DIAGNOSIS — I214 Non-ST elevation (NSTEMI) myocardial infarction: Secondary | ICD-10-CM | POA: Diagnosis not present

## 2022-04-26 DIAGNOSIS — I5A Non-ischemic myocardial injury (non-traumatic): Secondary | ICD-10-CM | POA: Diagnosis present

## 2022-04-26 DIAGNOSIS — R0602 Shortness of breath: Secondary | ICD-10-CM | POA: Diagnosis not present

## 2022-04-26 DIAGNOSIS — Z923 Personal history of irradiation: Secondary | ICD-10-CM

## 2022-04-26 DIAGNOSIS — R531 Weakness: Principal | ICD-10-CM

## 2022-04-26 DIAGNOSIS — E119 Type 2 diabetes mellitus without complications: Secondary | ICD-10-CM

## 2022-04-26 DIAGNOSIS — Z833 Family history of diabetes mellitus: Secondary | ICD-10-CM | POA: Diagnosis not present

## 2022-04-26 DIAGNOSIS — E66813 Obesity, class 3: Secondary | ICD-10-CM | POA: Diagnosis present

## 2022-04-26 DIAGNOSIS — C50911 Malignant neoplasm of unspecified site of right female breast: Secondary | ICD-10-CM | POA: Diagnosis present

## 2022-04-26 DIAGNOSIS — Z9011 Acquired absence of right breast and nipple: Secondary | ICD-10-CM | POA: Diagnosis not present

## 2022-04-26 DIAGNOSIS — Z6841 Body Mass Index (BMI) 40.0 and over, adult: Secondary | ICD-10-CM

## 2022-04-26 DIAGNOSIS — D0590 Unspecified type of carcinoma in situ of unspecified breast: Secondary | ICD-10-CM | POA: Diagnosis present

## 2022-04-26 DIAGNOSIS — E861 Hypovolemia: Secondary | ICD-10-CM | POA: Diagnosis present

## 2022-04-26 DIAGNOSIS — E870 Hyperosmolality and hypernatremia: Secondary | ICD-10-CM | POA: Diagnosis present

## 2022-04-26 DIAGNOSIS — I2489 Other forms of acute ischemic heart disease: Secondary | ICD-10-CM | POA: Diagnosis present

## 2022-04-26 DIAGNOSIS — N179 Acute kidney failure, unspecified: Secondary | ICD-10-CM | POA: Diagnosis present

## 2022-04-26 DIAGNOSIS — E1010 Type 1 diabetes mellitus with ketoacidosis without coma: Secondary | ICD-10-CM | POA: Diagnosis not present

## 2022-04-26 DIAGNOSIS — D638 Anemia in other chronic diseases classified elsewhere: Secondary | ICD-10-CM | POA: Diagnosis present

## 2022-04-26 DIAGNOSIS — R7989 Other specified abnormal findings of blood chemistry: Secondary | ICD-10-CM | POA: Diagnosis not present

## 2022-04-26 DIAGNOSIS — D0511 Intraductal carcinoma in situ of right breast: Secondary | ICD-10-CM

## 2022-04-26 DIAGNOSIS — E860 Dehydration: Secondary | ICD-10-CM | POA: Diagnosis present

## 2022-04-26 DIAGNOSIS — Z853 Personal history of malignant neoplasm of breast: Secondary | ICD-10-CM | POA: Diagnosis present

## 2022-04-26 DIAGNOSIS — B9689 Other specified bacterial agents as the cause of diseases classified elsewhere: Secondary | ICD-10-CM | POA: Diagnosis present

## 2022-04-26 HISTORY — DX: Unspecified type of carcinoma in situ of unspecified breast: D05.90

## 2022-04-26 HISTORY — DX: Type 2 diabetes mellitus without complications: E11.9

## 2022-04-26 LAB — COMPREHENSIVE METABOLIC PANEL
ALT: 23 U/L (ref 0–44)
AST: 35 U/L (ref 15–41)
Albumin: 4.3 g/dL (ref 3.5–5.0)
Alkaline Phosphatase: 117 U/L (ref 38–126)
Anion gap: 32 — ABNORMAL HIGH (ref 5–15)
BUN: 41 mg/dL — ABNORMAL HIGH (ref 6–20)
CO2: 16 mmol/L — ABNORMAL LOW (ref 22–32)
Calcium: 11 mg/dL — ABNORMAL HIGH (ref 8.9–10.3)
Chloride: 114 mmol/L — ABNORMAL HIGH (ref 98–111)
Creatinine, Ser: 2.33 mg/dL — ABNORMAL HIGH (ref 0.44–1.00)
GFR, Estimated: 25 mL/min — ABNORMAL LOW (ref >60)
Glucose, Bld: 869 mg/dL (ref 70–99)
Potassium: 4.7 mmol/L (ref 3.5–5.1)
Sodium: 162 mmol/L (ref 135–145)
Total Bilirubin: 1.9 mg/dL — ABNORMAL HIGH (ref 0.3–1.2)
Total Protein: 9.3 g/dL — ABNORMAL HIGH (ref 6.5–8.1)

## 2022-04-26 LAB — BASIC METABOLIC PANEL
Anion gap: 22 — ABNORMAL HIGH (ref 5–15)
Anion gap: 10 (ref 5–15)
BUN: 30 mg/dL — ABNORMAL HIGH (ref 6–20)
BUN: 22 mg/dL — ABNORMAL HIGH (ref 6–20)
CO2: 22 mmol/L (ref 22–32)
CO2: 26 mmol/L (ref 22–32)
Calcium: 10.1 mg/dL (ref 8.9–10.3)
Calcium: 9.2 mg/dL (ref 8.9–10.3)
Chloride: 117 mmol/L — ABNORMAL HIGH (ref 98–111)
Chloride: 122 mmol/L — ABNORMAL HIGH (ref 98–111)
Creatinine, Ser: 1.5 mg/dL — ABNORMAL HIGH (ref 0.44–1.00)
Creatinine, Ser: 1.16 mg/dL — ABNORMAL HIGH (ref 0.44–1.00)
GFR, Estimated: 42 mL/min — ABNORMAL LOW (ref >60)
GFR, Estimated: 57 mL/min — ABNORMAL LOW (ref >60)
Glucose, Bld: 336 mg/dL — ABNORMAL HIGH (ref 70–99)
Glucose, Bld: 199 mg/dL — ABNORMAL HIGH (ref 70–99)
Potassium: 4 mmol/L (ref 3.5–5.1)
Potassium: 3.4 mmol/L — ABNORMAL LOW (ref 3.5–5.1)
Sodium: 161 mmol/L (ref 135–145)
Sodium: 158 mmol/L — ABNORMAL HIGH (ref 135–145)

## 2022-04-26 LAB — URINE DRUG SCREEN, QUALITATIVE (ARMC ONLY)
Amphetamines, Ur Screen: NOT DETECTED
Barbiturates, Ur Screen: NOT DETECTED
Benzodiazepine, Ur Scrn: NOT DETECTED
Cannabinoid 50 Ng, Ur ~~LOC~~: NOT DETECTED
Cocaine Metabolite,Ur ~~LOC~~: NOT DETECTED
MDMA (Ecstasy)Ur Screen: NOT DETECTED
Methadone Scn, Ur: NOT DETECTED
Opiate, Ur Screen: NOT DETECTED
Phencyclidine (PCP) Ur S: NOT DETECTED
Tricyclic, Ur Screen: NOT DETECTED

## 2022-04-26 LAB — URINALYSIS, COMPLETE (UACMP) WITH MICROSCOPIC
Bilirubin Urine: NEGATIVE
Glucose, UA: >=500 mg/dL — AB
Hgb urine dipstick: NEGATIVE
Ketones, ur: 20 mg/dL — AB
Leukocytes,Ua: NEGATIVE
Nitrite: NEGATIVE
Protein, ur: NEGATIVE mg/dL
Specific Gravity, Urine: 1.02 (ref 1.005–1.030)
pH: 5 (ref 5.0–8.0)

## 2022-04-26 LAB — GLUCOSE, CAPILLARY
Glucose-Capillary: 348 mg/dL — ABNORMAL HIGH (ref 70–99)
Glucose-Capillary: 328 mg/dL — ABNORMAL HIGH (ref 70–99)
Glucose-Capillary: 274 mg/dL — ABNORMAL HIGH (ref 70–99)
Glucose-Capillary: 241 mg/dL — ABNORMAL HIGH (ref 70–99)
Glucose-Capillary: 209 mg/dL — ABNORMAL HIGH (ref 70–99)
Glucose-Capillary: 227 mg/dL — ABNORMAL HIGH (ref 70–99)
Glucose-Capillary: 206 mg/dL — ABNORMAL HIGH (ref 70–99)
Glucose-Capillary: 205 mg/dL — ABNORMAL HIGH (ref 70–99)
Glucose-Capillary: 171 mg/dL — ABNORMAL HIGH (ref 70–99)
Glucose-Capillary: 182 mg/dL — ABNORMAL HIGH (ref 70–99)
Glucose-Capillary: 158 mg/dL — ABNORMAL HIGH (ref 70–99)

## 2022-04-26 LAB — TROPONIN I (HIGH SENSITIVITY)
Troponin I (High Sensitivity): 257 ng/L (ref <18)
Troponin I (High Sensitivity): 2769 ng/L (ref <18)
Troponin I (High Sensitivity): 2247 ng/L (ref <18)

## 2022-04-26 LAB — SODIUM, URINE, RANDOM: Sodium, Ur: 21 mmol/L

## 2022-04-26 LAB — CBC
HCT: 48 % — ABNORMAL HIGH (ref 36.0–46.0)
Hemoglobin: 15 g/dL (ref 12.0–15.0)
MCH: 28.5 pg (ref 26.0–34.0)
MCHC: 31.3 g/dL (ref 30.0–36.0)
MCV: 91.3 fL (ref 80.0–100.0)
Platelets: 247 10*3/uL (ref 150–400)
RBC: 5.26 MIL/uL — ABNORMAL HIGH (ref 3.87–5.11)
RDW: 14.7 % (ref 11.5–15.5)
WBC: 13.6 10*3/uL — ABNORMAL HIGH (ref 4.0–10.5)
nRBC: 0.1 % (ref 0.0–0.2)

## 2022-04-26 LAB — CBG MONITORING, ED
Glucose-Capillary: >600 mg/dL (ref 70–99)
Glucose-Capillary: 570 mg/dL (ref 70–99)
Glucose-Capillary: 345 mg/dL — ABNORMAL HIGH (ref 70–99)
Glucose-Capillary: 488 mg/dL — ABNORMAL HIGH (ref 70–99)
Glucose-Capillary: 449 mg/dL — ABNORMAL HIGH (ref 70–99)

## 2022-04-26 LAB — RESP PANEL BY RT-PCR (RSV, FLU A&B, COVID)  RVPGX2
Influenza A by PCR: NEGATIVE
Influenza B by PCR: NEGATIVE
Resp Syncytial Virus by PCR: NEGATIVE
SARS Coronavirus 2 by RT PCR: NEGATIVE

## 2022-04-26 LAB — PHOSPHORUS: Phosphorus: 3 mg/dL (ref 2.5–4.6)

## 2022-04-26 LAB — HEPARIN LEVEL (UNFRACTIONATED): Heparin Unfractionated: 0.96 IU/mL — ABNORMAL HIGH (ref 0.30–0.70)

## 2022-04-26 LAB — PROTIME-INR
INR: 1.2 (ref 0.8–1.2)
Prothrombin Time: 15.3 seconds — ABNORMAL HIGH (ref 11.4–15.2)

## 2022-04-26 LAB — MRSA NEXT GEN BY PCR, NASAL: MRSA by PCR Next Gen: NOT DETECTED

## 2022-04-26 LAB — OSMOLALITY: Osmolality: 386 mOsm/kg (ref 275–295)

## 2022-04-26 LAB — APTT: aPTT: 23 seconds — ABNORMAL LOW (ref 24–36)

## 2022-04-26 LAB — HIV ANTIBODY (ROUTINE TESTING W REFLEX): HIV Screen 4th Generation wRfx: NONREACTIVE

## 2022-04-26 LAB — OSMOLALITY, URINE: Osmolality, Ur: 543 mOsm/kg (ref 300–900)

## 2022-04-26 LAB — BETA-HYDROXYBUTYRIC ACID: Beta-Hydroxybutyric Acid: >8 mmol/L — ABNORMAL HIGH (ref 0.05–0.27)

## 2022-04-26 LAB — HEMOGLOBIN A1C
Hgb A1c MFr Bld: 12.1 % — ABNORMAL HIGH (ref 4.8–5.6)
Mean Plasma Glucose: 300.57 mg/dL

## 2022-04-26 LAB — TSH: TSH: 0.836 u[IU]/mL (ref 0.350–4.500)

## 2022-04-26 MED ORDER — LACTATED RINGERS IV BOLUS
1000.0000 mL | Freq: Once | INTRAVENOUS | Status: AC
  Administered 2022-04-26: 1000 mL via INTRAVENOUS

## 2022-04-26 MED ORDER — LORATADINE 10 MG PO TABS
5.0000 mg | ORAL_TABLET | Freq: Every day | ORAL | Status: DC
  Administered 2022-04-26 – 2022-04-29 (×4): 5 mg via ORAL
  Filled 2022-04-26 (×4): qty 1

## 2022-04-26 MED ORDER — ASPIRIN 81 MG PO TBEC
81.0000 mg | DELAYED_RELEASE_TABLET | Freq: Every day | ORAL | Status: DC
  Administered 2022-04-27 – 2022-04-29 (×3): 81 mg via ORAL
  Filled 2022-04-26 (×3): qty 1

## 2022-04-26 MED ORDER — DEXTROSE IN LACTATED RINGERS 5 % IV SOLN: INTRAVENOUS | Status: DC

## 2022-04-26 MED ORDER — SODIUM CHLORIDE 0.9 % IV BOLUS
1000.0000 mL | Freq: Once | INTRAVENOUS | Status: AC
  Administered 2022-04-26: 1000 mL via INTRAVENOUS

## 2022-04-26 MED ORDER — SODIUM CHLORIDE 0.9 % IV SOLN: INTRAVENOUS | Status: DC | PRN

## 2022-04-26 MED ORDER — ADULT MULTIVITAMIN W/MINERALS CH
1.0000 | ORAL_TABLET | Freq: Every day | ORAL | Status: DC
  Administered 2022-04-27 – 2022-04-28 (×2): 1 via ORAL
  Filled 2022-04-26 (×3): qty 1

## 2022-04-26 MED ORDER — POTASSIUM CHLORIDE 10 MEQ/100ML IV SOLN
10.0000 meq | INTRAVENOUS | Status: AC
  Administered 2022-04-26 – 2022-04-27 (×4): 10 meq via INTRAVENOUS
  Filled 2022-04-26 (×4): qty 100

## 2022-04-26 MED ORDER — METRONIDAZOLE 500 MG PO TABS
500.0000 mg | ORAL_TABLET | Freq: Two times a day (BID) | ORAL | Status: DC
  Administered 2022-04-26 – 2022-04-29 (×7): 500 mg via ORAL
  Filled 2022-04-26 (×7): qty 1

## 2022-04-26 MED ORDER — FLUTICASONE PROPIONATE 50 MCG/ACT NA SUSP
1.0000 | Freq: Every day | NASAL | Status: DC
  Administered 2022-04-26 – 2022-04-29 (×4): 1 via NASAL
  Filled 2022-04-26: qty 16

## 2022-04-26 MED ORDER — NITROGLYCERIN 0.4 MG SL SUBL: 0.4000 mg | SUBLINGUAL_TABLET | SUBLINGUAL | Status: DC | PRN

## 2022-04-26 MED ORDER — HYDROCODONE-ACETAMINOPHEN 5-325 MG PO TABS: 1.0000 | ORAL_TABLET | Freq: Four times a day (QID) | ORAL | Status: DC | PRN

## 2022-04-26 MED ORDER — HEPARIN (PORCINE) 25000 UT/250ML-% IV SOLN
800.0000 [IU]/h | INTRAVENOUS | Status: AC
  Administered 2022-04-26: 950 [IU]/h via INTRAVENOUS
  Administered 2022-04-27: 800 [IU]/h via INTRAVENOUS
  Filled 2022-04-26 (×3): qty 250

## 2022-04-26 MED ORDER — LACTATED RINGERS IV SOLN: INTRAVENOUS | Status: DC

## 2022-04-26 MED ORDER — DEXTROSE 50 % IV SOLN: 0.0000 mL | INTRAVENOUS | Status: DC | PRN

## 2022-04-26 MED ORDER — ATORVASTATIN CALCIUM 20 MG PO TABS
40.0000 mg | ORAL_TABLET | Freq: Every evening | ORAL | Status: DC
  Administered 2022-04-27 – 2022-04-28 (×2): 40 mg via ORAL
  Filled 2022-04-26 (×2): qty 2

## 2022-04-26 MED ORDER — HEPARIN BOLUS VIA INFUSION
4000.0000 [IU] | Freq: Once | INTRAVENOUS | Status: AC
  Administered 2022-04-26: 4000 [IU] via INTRAVENOUS
  Filled 2022-04-26: qty 4000

## 2022-04-26 MED ORDER — ONDANSETRON HCL 4 MG/2ML IJ SOLN
4.0000 mg | Freq: Three times a day (TID) | INTRAMUSCULAR | Status: DC | PRN
  Administered 2022-04-26: 4 mg via INTRAVENOUS
  Filled 2022-04-26: qty 2

## 2022-04-26 MED ORDER — CHLORHEXIDINE GLUCONATE CLOTH 2 % EX PADS
6.0000 | MEDICATED_PAD | Freq: Every day | CUTANEOUS | Status: DC
  Administered 2022-04-27 – 2022-04-29 (×3): 6 via TOPICAL
  Filled 2022-04-26: qty 6

## 2022-04-26 MED ORDER — ASPIRIN 81 MG PO CHEW: 324.0000 mg | CHEWABLE_TABLET | Freq: Once | ORAL | Status: DC

## 2022-04-26 MED ORDER — ACETAMINOPHEN 325 MG PO TABS
650.0000 mg | ORAL_TABLET | Freq: Four times a day (QID) | ORAL | Status: DC | PRN
  Administered 2022-04-28: 650 mg via ORAL
  Filled 2022-04-26: qty 2

## 2022-04-26 MED ORDER — POTASSIUM CHLORIDE 10 MEQ/100ML IV SOLN
10.0000 meq | INTRAVENOUS | Status: AC
  Administered 2022-04-26 (×2): 10 meq via INTRAVENOUS
  Filled 2022-04-26 (×2): qty 100

## 2022-04-26 MED ORDER — INSULIN REGULAR(HUMAN) IN NACL 100-0.9 UT/100ML-% IV SOLN
INTRAVENOUS | Status: DC
  Administered 2022-04-26: 11.5 [IU]/h via INTRAVENOUS
  Administered 2022-04-27: 2.4 [IU]/h via INTRAVENOUS
  Filled 2022-04-26 (×2): qty 100

## 2022-04-26 MED ORDER — INSULIN REGULAR(HUMAN) IN NACL 100-0.9 UT/100ML-% IV SOLN: INTRAVENOUS | Status: DC

## 2022-04-26 MED ORDER — DEXTROSE-NACL 5-0.45 % IV SOLN: INTRAVENOUS | Status: DC

## 2022-04-26 NOTE — ED Notes (Signed)
Blood glucose 345

## 2022-04-26 NOTE — Consult Note (Signed)
ANTICOAGULATION CONSULT NOTE - Initial Consult  Pharmacy Consult for Heparin infusion Indication: chest pain/ACS  No Known Allergies  Patient Measurements:   Heparin Dosing Weight: 75.4 kg  Vital Signs: Temp Source: Oral (02/22 0730) BP: 101/51 (02/22 0730) Pulse Rate: 124 (02/22 0730)  Labs: Recent Labs    04/26/22 0742  HGB 15.0  HCT 48.0*  PLT 247  CREATININE 2.33*  TROPONINIHS 257*    Estimated Creatinine Clearance: 33.4 mL/min (A) (by C-G formula based on SCr of 2.33 mg/dL (H)).   Medical History: Past Medical History:  Diagnosis Date   Medical history non-contributory    Pre-diabetes     Medications:  No prior history of chronic AC use.  Assessment: 52 y.o. female with a past medical history of breast cancer status postmastectomy in December currently on radiation treatment for the past 2 weeks who presents to the emergency department for generalized fatigue and weakness.  Patient's troponin elevated to 257, not entirely clear the cause of the troponin elevation. No chest pain. EKG shows no concerning ST findings. Pharmacy has been consulted to initiate heparin as a precaution.   Baseline labs: aPTT pending, INR pending, Hgb 15, Plts 247   Goal of Therapy:  Heparin level 0.3-0.7 units/ml Monitor platelets by anticoagulation protocol: Yes   Plan:  Give 4000 units bolus x 1 Start heparin infusion at 950 units/hr Check anti-Xa level in 6 hours and daily while on heparin Continue to monitor H&H and platelets  Damariz Paganelli A [employer] 04/26/2022,9:40 AM

## 2022-04-26 NOTE — ED Triage Notes (Signed)
Pt comes in today via [facility] from home with complaints of generalized weakness. According to EMS, the pt woke up this morning and was getting ready for work and started feeling really weak and winded. Pt has been diagnosed with breast cancer on the right side, and started radiation treatments 2 weeks ago. According to EMS, family at the home reported that the pt started taking Sucralfate 2 days ago, and they've noticed increased weakness sense. Pt is lethargic, but responsive to commands and questions. Pt is alert and oriented X4, and in no obvious signs of distress.  EMS vital signs: BP: 134/45 Blood sugar: 426 HR: 114 Spo2 100% 2L RR:26 Co2:26

## 2022-04-26 NOTE — Progress Notes (Addendum)
       CROSS COVER NOTE  NAME: Veronica Chandler MRN: [id number] DOB : 29-May-1970    HPI/Events of Note   Patient admitted with DKA and  NSTEMI. Informed by nurse sodium level 161. Remains on endotool  Assessment and  Interventions   Assessment: Corrected sodium for glucose 165. Creatinine improved from 2.33 to 1.5. Baseline normal around 0.88. K good at 4.7, bicarb still low at 16 Plan: Change D5 LR to D5 0.45 % sodium chloride Continue q 4 h BMP       Kathlene Cote NP [facility]   0530  DKA resolved Transition orders off IV insulin - semglee 15 units x 1 ordered wit initiation of carb controlled healthy heart diet Sodium continues ot improve - continue 1/2 ns for maintenance fluids

## 2022-04-26 NOTE — ED Provider Notes (Signed)
[facility] Provider Note    Event Date/Time   First MD Initiated Contact with Patient 04/26/22 0725     (approximate)  History   Chief Complaint: Weakness  HPI  Veronica Chandler is a 52 y.o. female with a past medical history of breast cancer status postmastectomy in December currently on radiation treatment for the past 2 weeks who presents to the emergency department for generalized fatigue and weakness.  According to the patient for the past 2 to 3 days she has been feeling very weak.  Patient denies any falls.  No chest pain or abdominal pain.  No fever cough or congestion.  No urinary symptoms.  Given the generalized fatigue and weakness patient presented to the emergency department for evaluation.  Patient noted to be somewhat tachycardic.  Patient admits she has not been eating or drinking very much at home lately due to fatigue.  Physical Exam   Triage Vital Signs: ED Triage Vitals  Enc Vitals Group     BP --      Pulse Rate 04/26/22 0730 (!) 124     Resp --      Temp --      Temp Source 04/26/22 0730 Oral     SpO2 --      Weight --      Height --      Head Circumference --      Peak Flow --      Pain Score 04/26/22 0731 0     Pain Loc --      Pain Edu? --      Excl. in [place]? --     Most recent vital signs: Vitals:   04/26/22 0730  Pulse: (!) 124    General: Awake, no distress.  CV:  Good peripheral perfusion.  Regular rate and rhythm  Resp:  Normal effort.  Equal breath sounds bilaterally.  Abd:  No distention.  Soft, nontender.  No rebound or guarding.  ED Results / Procedures / Treatments   EKG  EKG viewed and interpreted by myself shows sinus tachycardia 123 bpm with a narrow QRS, normal axis, normal intervals, no concerning ST changes.  MEDICATIONS ORDERED IN ED: Medications  sodium chloride 0.9 % bolus 1,000 mL (has no administration in time range)     IMPRESSION / MDM / ASSESSMENT AND PLAN / ED COURSE  I  reviewed the triage vital signs and the nursing notes.  Patient's presentation is most consistent with acute presentation with potential threat to life or bodily function.  Patient presents emergency department for generalized fatigue and weakness over the past several days.  Overall the patient appears well, no distress.  Largely negative review of systems.  We will check labs including TSH and cardiac enzymes.  Will obtain a urinalysis we will plan a COVID flu and RSV swab we will IV hydrate while awaiting results.  Patient agreeable to plan of care.  Differential would include electrolyte or metabolic abnormality, dehydration, infectious etiology such as UTI COVID or flu, fatigue due to radiation.  Patient's labs have begun to result showing negative COVID/flu/RSV.  Patient has mild leukocytosis of 13,000 otherwise reassuring CBC.  Patient's chemistry has resulted significantly abnormal with significant hypernatremia of 162 in addition the patient's blood glucose is also elevated to 869 with a bicarb of 16 and anion gap of 32 as well as renal insufficiency with a creatinine of 2.3.  Findings are suggestive of severe dehydration as well as diabetic ketoacidosis.  Will obtain a VBG to further evaluate we will start the patient on an insulin infusion potassium of 4.7 currently.  Patient has received 1 L of normal saline we will dose a liter of lactated Ringer's.  Patient's troponin elevated to 257, not entirely clear the cause of the troponin elevation.  No chest pain.  EKG shows no concerning ST findings.  We will continue to trend the troponin we will start on heparin as a precaution.  Patient will require admission to the hospital service once emergency department workup has been completed.  I spoke to the patient in more depth after these lab values have resulted, she states she often takes 212 ounce bottles of water to work with her and often times will not finish these throughout the day.  The sodium  level once corrected for glucose is even more concerning for severe dehydration, I reviewed the patient's medications I do not see any obvious medication that would be causing this.  Patient states she has not been keeping tabs on her blood glucose.  In addition to low oral fluid intake her blood glucose could be causing significant water loss possibly precipitating this severe hyponatremia and dehydration.    CRITICAL CARE Performed by: Harvest Dark   Total critical care time: 60 minutes  Critical care time was exclusive of separately billable procedures and treating other patients.  Critical care was necessary to treat or prevent imminent or life-threatening deterioration.  Critical care was time spent personally by me on the following activities: development of treatment plan with patient and/or surrogate as well as nursing, discussions with consultants, evaluation of patient's response to treatment, examination of patient, obtaining history from patient or surrogate, ordering and performing treatments and interventions, ordering and review of laboratory studies, ordering and review of radiographic studies, pulse oximetry and re-evaluation of patient's condition.   FINAL CLINICAL IMPRESSION(S) / ED DIAGNOSES   Weakness Hyponatremia Dehydration DKA Elevated troponin   Note:  This document was prepared using Dragon voice recognition software and may include unintentional dictation errors.   Harvest Dark, MD 04/26/22 1257

## 2022-04-26 NOTE — H&P (Signed)
History and Physical    Veronica Chandler [id number] DOB: 06-04-1970 DOA: 04/26/2022  Referring MD/NP/PA:   PCP: Jearld Fenton, NP   Patient coming from:  The patient is coming from home.    Chief Complaint: Generalized weakness  HPI: Veronica Chandler is a 51 y.o. female with medical history significant of DCIS (s/p of right mastectomy, on radiation therapy), bacterial vaginosis, diabetes mellitus, who presents with generalized weakness.  Patient states that she has generalized weakness in the past 3 days.  No fall, no unilateral numbness or tingling in extremities.  Patient has poor appetite and decreased oral intake.  Denies nausea, vomiting, diarrhea or abdominal pain.  No symptoms of UTI.  Denies chest pain, cough, shortness breath.  Patient was diagnosed with bacterial vaginosis and started on Flagyl on 2/22.  Patient states that her vaginal discharge has resolved.  Patient is currently doing radiation therapy for breast cancer, last treatment was yesterday.  Data reviewed independently and ED Course: pt was found to have trop 257, sodium 162, potassium 4.7, calcium 11.0, DKA (blood sugar 8069, bicarbonate 16, anion gap 32), AKI with creatinine 2.33, BUN 41, GFR 25 (recent baseline creatinine 0.88 on 02/05/2022), WBC 13.6, negative PCR for COVID, flu and RSV.  Temperature 97.9, blood pressure 134/97, heart rate 124, RR 24, oxygen saturation 100% on room air.  Patient is admitted to stepdown as inpatient.   EKG: I have personally reviewed.  Sinus rhythm, QTc 458, heart rate 120, LAE.  Review of Systems:   General: no fevers, chills, no body weight gain, has poor appetite, has fatigue HEENT: no blurry vision, hearing changes or sore throat Respiratory: no dyspnea, coughing, wheezing CV: no chest pain, no palpitations GI: no nausea, vomiting, abdominal pain, diarrhea, constipation GU: no dysuria, burning on urination, increased urinary frequency, hematuria  Ext: no  leg edema Neuro: no unilateral weakness, numbness, or tingling, no vision change or hearing loss Skin: no rash, no skin tear. MSK: No muscle spasm, no deformity, no limitation of range of movement in spin Heme: No easy bruising.  Travel history: No recent long distant travel.   Allergy: No Known Allergies  Past Medical History:  Diagnosis Date   Medical history non-contributory    Pre-diabetes     Past Surgical History:  Procedure Laterality Date   BREAST BIOPSY Right 02/01/2022   stereo bx, calcs, "X" clip-DCIS   BREAST BIOPSY Right 02/01/2022   MM RT BREAST BX W LOC DEV 1ST LESION IMAGE BX SPEC STEREO GUIDE 02/01/2022 [facility]   BREAST BIOPSY Right 02/14/2022   MM RT RADIO FREQUENCY TAG LOC MAMMO GUIDE 02/14/2022 [facility]   COLONOSCOPY WITH PROPOFOL N/A 06/16/2020   Procedure: COLONOSCOPY WITH PROPOFOL;  Surgeon: Jonathon Bellows, MD;  Location[facility] ENDOSCOPY;  Service: Gastroenterology;  Laterality: N/A;   NO PAST SURGERIES     PART MASTECTOMY,RADIO FREQUENCY LOCALIZER,AXILLARY SENTINEL NODE BIOPSY Right 03/01/2022   Procedure: PART MASTECTOMY,RADIO FREQUENCY LOCALIZER,AXILLARY SENTINEL NODE BIOPSY;  Surgeon: Benjamine Sprague, DO;  Location: [facility];  Service: General;  Laterality: Right;    Social History:  reports that she has never smoked. She has never used smokeless tobacco. She reports current alcohol use. She reports current drug use. Drug: Marijuana.  Family History:  Family History  Problem Relation Age of Onset   Diabetes Father    Prostate cancer Father    Lung cancer Maternal Aunt    Pancreatic cancer Maternal Aunt    Lung cancer Paternal Aunt    Diabetes  Paternal Grandmother    Breast cancer Neg Hx    Colon cancer Neg Hx    Ovarian cancer Neg Hx      Prior to Admission medications   Medication Sig Start Date End Date Taking? Authorizing Provider  fluticasone (FLONASE) 50 MCG/ACT nasal spray SPRAY 2 SPRAYS INTO EACH NOSTRIL EVERY DAY 04/09/22   Yes Baity, Coralie Keens, NP  HYDROcodone-acetaminophen (NORCO) 5-325 MG tablet Take 1 tablet by mouth every 6 (six) hours as needed for up to 6 doses for moderate pain. 03/01/22  Yes Sakai, Isami, DO  ibuprofen (ADVIL) 800 MG tablet Take 1 tablet (800 mg total) by mouth every 8 (eight) hours as needed for mild pain or moderate pain. 03/01/22  Yes Sakai, Isami, DO  levocetirizine (XYZAL) 5 MG tablet TAKE 1 TABLET BY MOUTH EVERY DAY IN THE EVENING Patient taking differently: Take 5 mg by mouth every morning. 11/14/21  Yes Jearld Fenton, NP  lidocaine-prilocaine (EMLA) cream Apply 1 Application topically once. 02/06/22  Yes [provider]  metroNIDAZOLE (FLAGYL) 500 MG tablet Take 1 tablet (500 mg total) by mouth 2 (two) times daily. 04/19/22  Yes Mecum, Erin E, PA-C  Multiple Vitamin (MULTIVITAMIN WITH MINERALS) TABS tablet Take 1 tablet by mouth daily.   Yes [provider]  sucralfate (CARAFATE) 1 g tablet Take 1 tablet (1 g total) by mouth 4 (four) times daily -  with meals and at bedtime. Dissolve in 3 to 4 T warm water swish and swallow before meals. 04/24/22  Yes Meade Maw, MD    Physical Exam: Vitals:   04/26/22 1130 04/26/22 1200 04/26/22 1258 04/26/22 1400  BP: (!) 125/92 133/89 (!) 147/87 (!) 152/79  Pulse: (!) 116 (!) 116 (!) 113 (!) 107  Resp: 13 18 18 20  $ Temp:  97.9 F (36.6 C) 97.9 F (36.6 C)   TempSrc:      SpO2: 100% 100% 98% 98%  Weight:   115.8 kg    General: Not in acute distress, dry mucous membranes. HEENT:       Eyes: PERRL, EOMI, no scleral icterus.       ENT: No discharge from the ears and nose, no pharynx injection, no tonsillar enlargement.        Neck: No JVD, no bruit, no mass felt. Heme: No neck lymph node enlargement. Cardiac: S1/S2, RRR, No murmurs, No gallops or rubs. Respiratory: No rales, wheezing, rhonchi or rubs. GI: Soft, nondistended, nontender, no rebound pain, no organomegaly, BS present. GU: No hematuria Ext: No pitting  leg edema bilaterally. 1+DP/PT pulse bilaterally. Musculoskeletal: No joint deformities, No joint redness or warmth, no limitation of ROM in spin. Skin: No rashes.  Neuro: Alert, oriented X3, cranial nerves II-XII grossly intact, moves all extremities normally. Psych: Patient is not psychotic, no suicidal or hemocidal ideation.  Labs on Admission: I have personally reviewed following labs and imaging studies  CBC: Recent Labs  Lab 04/26/22 0742  WBC 13.6*  HGB 15.0  HCT 48.0*  MCV 91.3  PLT [id number]   Basic Metabolic Panel: Recent Labs  Lab 04/26/22 0742 04/26/22 1437  NA 162*  --   K 4.7  --   CL 114*  --   CO2 16*  --   GLUCOSE 869*  --   BUN 41*  --   CREATININE 2.33*  --   CALCIUM 11.0*  --   PHOS  --  3.0   GFR: Estimated Creatinine Clearance: 32.8 mL/min (A) (by C-G formula based  on SCr of 2.33 mg/dL (H)). Liver Function Tests: Recent Labs  Lab 04/26/22 0742  AST 35  ALT 23  ALKPHOS 117  BILITOT 1.9*  PROT 9.3*  ALBUMIN 4.3   No results for input(s): "LIPASE", "AMYLASE" in the last 168 hours. No results for input(s): "AMMONIA" in the last 168 hours. Coagulation Profile: Recent Labs  Lab 04/26/22 1437  INR 1.2   Cardiac Enzymes: No results for input(s): "CKTOTAL", "CKMB", "CKMBINDEX", "TROPONINI" in the last 168 hours. BNP (last 3 results) No results for input(s): "PROBNP" in the last 8760 hours. HbA1C: No results for input(s): "HGBA1C" in the last 72 hours. CBG: Recent Labs  Lab 04/26/22 1255 04/26/22 1356 04/26/22 1457 04/26/22 1557 04/26/22 1700  GLUCAP 348* 328* 274* 241* 209*   Lipid Profile: No results for input(s): "CHOL", "HDL", "LDLCALC", "TRIG", "CHOLHDL", "LDLDIRECT" in the last 72 hours. Thyroid Function Tests: Recent Labs    04/26/22 0742  TSH 0.836   Anemia Panel: No results for input(s): "VITAMINB12", "FOLATE", "FERRITIN", "TIBC", "IRON", "RETICCTPCT" in the last 72 hours. Urine analysis:    Component Value Date/Time    BILIRUBINUR Negative 04/16/2022 1110   PROTEINUR Negative 04/16/2022 1110   UROBILINOGEN 0.2 04/16/2022 1110   NITRITE Negative 04/16/2022 1110   LEUKOCYTESUR Negative 04/16/2022 1110   Sepsis Labs: @LABRCNTIP$ (procalcitonin:4,lacticidven:4) ) Recent Results (from the past 240 hour(s))  Resp panel by RT-PCR (RSV, Flu A&B, Covid) Anterior Nasal Swab     Status: None   Collection Time: 04/26/22  7:42 AM   Specimen: Anterior Nasal Swab  Result Value Ref Range Status   SARS Coronavirus 2 by RT PCR NEGATIVE NEGATIVE Final    Comment: (NOTE) SARS-CoV-2 target nucleic acids are NOT DETECTED.  The SARS-CoV-2 RNA is generally detectable in upper respiratory specimens during the acute phase of infection. The lowest concentration of SARS-CoV-2 viral copies this assay can detect is 138 copies/mL. A negative result does not preclude SARS-Cov-2 infection and should not be used as the sole basis for treatment or other patient management decisions. A negative result may occur with  improper specimen collection/handling, submission of specimen other than nasopharyngeal swab, presence of viral mutation(s) within the areas targeted by this assay, and inadequate number of viral copies(<138 copies/mL). A negative result must be combined with clinical observations, patient history, and epidemiological information. The expected result is Negative.  Fact Sheet for Patients:  EntrepreneurPulse.com.au  Fact Sheet for Healthcare Providers:  IncredibleEmployment.be  This test is no t yet approved or cleared by the [place] FDA and  has been authorized for detection and/or diagnosis of SARS-CoV-2 by FDA under an Emergency Use Authorization (EUA). This EUA will remain  in effect (meaning this test can be used) for the duration of the COVID-19 declaration under Section 564(b)(1) of the Act, 21 U.S.C.section 360bbb-3(b)(1), unless the authorization is terminated  or  revoked sooner.       Influenza A by PCR NEGATIVE NEGATIVE Final   Influenza B by PCR NEGATIVE NEGATIVE Final    Comment: (NOTE) The Xpert Xpress SARS-CoV-2/FLU/RSV plus assay is intended as an aid in the diagnosis of influenza from Nasopharyngeal swab specimens and should not be used as a sole basis for treatment. Nasal washings and aspirates are unacceptable for Xpert Xpress SARS-CoV-2/FLU/RSV testing.  Fact Sheet for Patients: EntrepreneurPulse.com.au  Fact Sheet for Healthcare Providers: IncredibleEmployment.be  This test is not yet approved or cleared by the [place] FDA and has been authorized for detection and/or diagnosis of SARS-CoV-2 by FDA  under an Emergency Use Authorization (EUA). This EUA will remain in effect (meaning this test can be used) for the duration of the COVID-19 declaration under Section 564(b)(1) of the Act, 21 U.S.C. section 360bbb-3(b)(1), unless the authorization is terminated or revoked.     Resp Syncytial Virus by PCR NEGATIVE NEGATIVE Final    Comment: (NOTE) Fact Sheet for Patients: EntrepreneurPulse.com.au  Fact Sheet for Healthcare Providers: IncredibleEmployment.be  This test is not yet approved or cleared by the [place] FDA and has been authorized for detection and/or diagnosis of SARS-CoV-2 by FDA under an Emergency Use Authorization (EUA). This EUA will remain in effect (meaning this test can be used) for the duration of the COVID-19 declaration under Section 564(b)(1) of the Act, 21 U.S.C. section 360bbb-3(b)(1), unless the authorization is terminated or revoked.  Performed at [facility], [address] [place], [place] [address]   MRSA Next Gen by PCR, Nasal     Status: None   Collection Time: 04/26/22  1:04 PM   Specimen: Nasal Mucosa; Nasal Swab  Result Value Ref Range Status   MRSA by PCR Next Gen NOT DETECTED NOT DETECTED Final     Comment: (NOTE) The GeneXpert MRSA Assay (FDA approved for NASAL specimens only), is one component of a comprehensive MRSA colonization surveillance program. It is not intended to diagnose MRSA infection nor to guide or monitor treatment for MRSA infections. Test performance is not FDA approved in patients less than 80 years old. Performed at [facility], [address], [place], [place] [address]      Radiological Exams on Admission: No results found.    Assessment/Plan Principal Problem:   DKA (diabetic ketoacidosis) ([place]) Active Problems:   Diabetes mellitus without complication ([place])   Myocardial injury   DCIS (ductal carcinoma in situ)   Hypernatremia   AKI (acute kidney injury) ([place])   Dehydration   Hypercalcemia   Leukocytosis   Bacterial vaginosis   Morbid obesity ([place])   Assessment and Plan:   DKA (diabetic ketoacidosis) and hx of diabetes mellitus without complication: blood sugar 8069, bicarbonate 16, anion gap 32. Pending UA for ketone, VBG for pH, and beta hydroxybutyric acid level.  Patient has a history of diabetes, but currently not taking any medications.  Recent A1c 6.6 on 07/05/2021.  - Admit to stepdown as inpt - IVF: 2L of LR and 1L of NS bolus - start DKA protocol with BMP q4h - IVF: LR at 125 cc/h, will switch to D5-LR at 125 cc/h when CBG<250 - replete K as needed - Zofran prn nausea  - consult to diabetic educator  Myocardial injury: trop 257 --> 2769.  Patient denies chest pain or shortness of breath. -Aspirin 324 mg, and then 81 mg daily -Lipitor 40 mg -Trend troponin -Check A1c, FLP, UDS -IV heparin  DCIS (ductal carcinoma in situ): s/p of right mastectomy, currently on radiation therapy.  Last dose was yesterday. -Follow-up with oncology  Hypernatremia: Sodium 162, most likely due to dehydration.  Mental status normal - Will check urine sodium, urine osmolality, serum osmolality. - IVF: as above - f/u by BMP  q8h  AKI (acute kidney injury) ([place]): likely due to dehydration and continuation of ibuprofen -Hold ibuprofen -IV fluid as above -Follow-up urinalysis  Dehydration -IV fluid as above  Hypercalcemia: Calcium 11.0, likely due to dehydration.  Patient has breast cancer, cannot completely rule out cancer related hypercalcemia.  Mental status normal -IV fluid as above -PTH, PTH related protein, phosphorus, 25-hydroxy  vitamin D  Leukocytosis: WBC 13.6. no fever, likely due to [place] -f/u with CBC  Bacterial vaginosis -continue home flagl  Morbid obesity ([place]): Body weight 115.8 kg, BMI 49.86 -Exercise and healthy diet -Encourage losing weight     DVT ppx: on IV Heparin       Code Status: Full code  Family Communication:  Yes, patient's sister   at bed side.    Disposition Plan:  Anticipate discharge back to previous environment  Consults called: Dr. Humphrey Rolls of card  Admission status and Level of care: Stepdown:  as inpt       Dispo: The patient is from: Home              Anticipated d/c is to: Home              Anticipated d/c date is: 2 days              Patient currently is not medically stable to d/c.    Severity of Illness:  The appropriate patient status for this patient is INPATIENT. Inpatient status is judged to be reasonable and necessary in order to provide the required intensity of service to ensure the patient's safety. The patient's presenting symptoms, physical exam findings, and initial radiographic and laboratory data in the context of their chronic comorbidities is felt to place them at high risk for further clinical deterioration. Furthermore, it is not anticipated that the patient will be medically stable for discharge from the hospital within 2 midnights of admission.   * I certify that at the point of admission it is my clinical judgment that the patient will require inpatient hospital care spanning beyond 2 midnights from the point of admission due to high  intensity of service, high risk for further deterioration and high frequency of surveillance required.*       Date of Service 04/26/2022    Ivor Costa [facility]   If 7PM-7AM, please contact night-coverage [email or web address] 04/26/2022, 5:16 PM

## 2022-04-26 NOTE — Inpatient Diabetes Management (Signed)
Inpatient Diabetes Program Recommendations  AACE/ADA: New Consensus Statement on Inpatient Glycemic Control (2015)  Target Ranges:  Prepandial:   less than 140 mg/dL      Peak postprandial:   less than 180 mg/dL (1-2 hours)      Critically ill patients:  140 - 180 mg/dL    Latest Reference Range & Units 04/26/22 07:42  Sodium 135 - 145 mmol/L 162 (HH)  Potassium 3.5 - 5.1 mmol/L 4.7  Chloride 98 - 111 mmol/L 114 (H)  CO2 22 - 32 mmol/L 16 (L)  Glucose 70 - 99 mg/dL 869 (HH)  BUN 6 - 20 mg/dL 41 (H)  Creatinine 0.44 - 1.00 mg/dL 2.33 (H)  Calcium 8.9 - 10.3 mg/dL 11.0 (H)  Anion gap 5 - 15  32 (H)  (HH): Data is critically high (H): Data is abnormally high (L): Data is abnormally low  Latest Reference Range & Units 04/26/22 09:24 04/26/22 10:10  Glucose-Capillary 70 - 99 mg/dL >600 (HH)  IV Insulin Drip Started 570 ([place])  ([address]): Data is critically high    Admit with: Weakness/ DKA  History: Breast Cancer (mastectomy Dec 2023), Pre-Diabetes  Home DM Meds: None  Current Orders: IV Insulin Drip   Will see pt 02/23 to discuss admission DKA, A1c, etc.    --Will follow patient during hospitalization--  Wyn Quaker RN, MSN, [place] Diabetes Coordinator Inpatient Glycemic Control Team Team Pager: [phone] (8a-5p)

## 2022-04-27 ENCOUNTER — Encounter: Payer: Self-pay | Admitting: Internal Medicine

## 2022-04-27 ENCOUNTER — Ambulatory Visit: Payer: Commercial Managed Care - PPO

## 2022-04-27 ENCOUNTER — Inpatient Hospital Stay
Admit: 2022-04-27 | Discharge: 2022-04-27 | Disposition: A | Discharge status: Home / Self Care | Payer: Commercial Managed Care - PPO

## 2022-04-27 ENCOUNTER — Other Ambulatory Visit (HOSPITAL_COMMUNITY): Payer: Self-pay

## 2022-04-27 ENCOUNTER — Inpatient Hospital Stay: Payer: Commercial Managed Care - PPO

## 2022-04-27 DIAGNOSIS — E860 Dehydration: Secondary | ICD-10-CM

## 2022-04-27 DIAGNOSIS — E1110 Type 2 diabetes mellitus with ketoacidosis without coma: Secondary | ICD-10-CM

## 2022-04-27 DIAGNOSIS — R0602 Shortness of breath: Secondary | ICD-10-CM

## 2022-04-27 DIAGNOSIS — I214 Non-ST elevation (NSTEMI) myocardial infarction: Secondary | ICD-10-CM

## 2022-04-27 DIAGNOSIS — R7989 Other specified abnormal findings of blood chemistry: Secondary | ICD-10-CM

## 2022-04-27 LAB — BLOOD GAS, VENOUS
Acid-Base Excess: 9.8 mmol/L — ABNORMAL HIGH (ref 0.0–2.0)
Bicarbonate: 33.4 mmol/L — ABNORMAL HIGH (ref 20.0–28.0)
O2 Saturation: 99.8 %
Patient temperature: 37
pCO2, Ven: 40 mmHg — ABNORMAL LOW (ref 44–60)
pH, Ven: 7.53 — ABNORMAL HIGH (ref 7.25–7.43)
pO2, Ven: 109 mmHg — ABNORMAL HIGH (ref 32–45)

## 2022-04-27 LAB — BASIC METABOLIC PANEL
Anion gap: 7 (ref 5–15)
Anion gap: 11 (ref 5–15)
BUN: 18 mg/dL (ref 6–20)
BUN: 16 mg/dL (ref 6–20)
CO2: 28 mmol/L (ref 22–32)
CO2: 23 mmol/L (ref 22–32)
Calcium: 8.9 mg/dL (ref 8.9–10.3)
Calcium: 8.3 mg/dL — ABNORMAL LOW (ref 8.9–10.3)
Chloride: 120 mmol/L — ABNORMAL HIGH (ref 98–111)
Chloride: 119 mmol/L — ABNORMAL HIGH (ref 98–111)
Creatinine, Ser: 1.22 mg/dL — ABNORMAL HIGH (ref 0.44–1.00)
Creatinine, Ser: 1.21 mg/dL — ABNORMAL HIGH (ref 0.44–1.00)
GFR, Estimated: 53 mL/min — ABNORMAL LOW (ref >60)
GFR, Estimated: 54 mL/min — ABNORMAL LOW (ref >60)
Glucose, Bld: 169 mg/dL — ABNORMAL HIGH (ref 70–99)
Glucose, Bld: 162 mg/dL — ABNORMAL HIGH (ref 70–99)
Potassium: 3.5 mmol/L (ref 3.5–5.1)
Potassium: 3.9 mmol/L (ref 3.5–5.1)
Sodium: 155 mmol/L — ABNORMAL HIGH (ref 135–145)
Sodium: 153 mmol/L — ABNORMAL HIGH (ref 135–145)

## 2022-04-27 LAB — CBC
HCT: 34.9 % — ABNORMAL LOW (ref 36.0–46.0)
Hemoglobin: 11.6 g/dL — ABNORMAL LOW (ref 12.0–15.0)
MCH: 29 pg (ref 26.0–34.0)
MCHC: 33.2 g/dL (ref 30.0–36.0)
MCV: 87.3 fL (ref 80.0–100.0)
Platelets: 214 10*3/uL (ref 150–400)
RBC: 4 MIL/uL (ref 3.87–5.11)
RDW: 14.6 % (ref 11.5–15.5)
WBC: 7.9 10*3/uL (ref 4.0–10.5)
nRBC: 0.4 % — ABNORMAL HIGH (ref 0.0–0.2)

## 2022-04-27 LAB — LIPID PANEL
Cholesterol: 105 mg/dL (ref 0–200)
HDL: 40 mg/dL — ABNORMAL LOW (ref >40)
LDL Cholesterol: 48 mg/dL (ref 0–99)
Total CHOL/HDL Ratio: 2.6 RATIO
Triglycerides: 86 mg/dL (ref <150)
VLDL: 17 mg/dL (ref 0–40)

## 2022-04-27 LAB — CALCITRIOL (1,25 DI-OH VIT D): Vit D, 1,25-Dihydroxy: 37.9 pg/mL (ref 24.8–81.5)

## 2022-04-27 LAB — GLUCOSE, CAPILLARY
Glucose-Capillary: 123 mg/dL — ABNORMAL HIGH (ref 70–99)
Glucose-Capillary: 149 mg/dL — ABNORMAL HIGH (ref 70–99)
Glucose-Capillary: 150 mg/dL — ABNORMAL HIGH (ref 70–99)
Glucose-Capillary: 159 mg/dL — ABNORMAL HIGH (ref 70–99)
Glucose-Capillary: 161 mg/dL — ABNORMAL HIGH (ref 70–99)
Glucose-Capillary: 170 mg/dL — ABNORMAL HIGH (ref 70–99)
Glucose-Capillary: 197 mg/dL — ABNORMAL HIGH (ref 70–99)
Glucose-Capillary: 201 mg/dL — ABNORMAL HIGH (ref 70–99)
Glucose-Capillary: 337 mg/dL — ABNORMAL HIGH (ref 70–99)

## 2022-04-27 LAB — ECHOCARDIOGRAM COMPLETE
S' Lateral: 2.1 cm
Weight: 4084.68 oz

## 2022-04-27 LAB — HEPARIN LEVEL (UNFRACTIONATED)
Heparin Unfractionated: 0.67 IU/mL (ref 0.30–0.70)
Heparin Unfractionated: 0.49 IU/mL (ref 0.30–0.70)

## 2022-04-27 LAB — HEMOGLOBIN A1C
Hgb A1c MFr Bld: 12.4 % — ABNORMAL HIGH (ref 4.8–5.6)
Mean Plasma Glucose: 309.18 mg/dL

## 2022-04-27 LAB — PARATHYROID HORMONE, INTACT (NO CA): PTH: 35 pg/mL (ref 15–65)

## 2022-04-27 MED ORDER — INSULIN ASPART 100 UNIT/ML IJ SOLN
3.0000 [IU] | Freq: Three times a day (TID) | INTRAMUSCULAR | Status: DC
  Administered 2022-04-27 – 2022-04-29 (×6): 3 [IU] via SUBCUTANEOUS
  Filled 2022-04-27 (×4): qty 1

## 2022-04-27 MED ORDER — POTASSIUM CHLORIDE 10 MEQ/100ML IV SOLN
10.0000 meq | INTRAVENOUS | Status: DC
  Filled 2022-04-27 (×4): qty 100

## 2022-04-27 MED ORDER — INSULIN GLARGINE-YFGN 100 UNIT/ML ~~LOC~~ SOLN
15.0000 [IU] | Freq: Every day | SUBCUTANEOUS | Status: AC
  Administered 2022-04-27: 15 [IU] via SUBCUTANEOUS
  Filled 2022-04-27: qty 0.15

## 2022-04-27 MED ORDER — INSULIN ASPART 100 UNIT/ML IJ SOLN
0.0000 [IU] | Freq: Three times a day (TID) | INTRAMUSCULAR | Status: DC
  Administered 2022-04-27: 15 [IU] via SUBCUTANEOUS
  Administered 2022-04-27: 4 [IU] via SUBCUTANEOUS
  Administered 2022-04-27: 3 [IU] via SUBCUTANEOUS
  Administered 2022-04-28: 15 [IU] via SUBCUTANEOUS
  Administered 2022-04-28: 7 [IU] via SUBCUTANEOUS
  Administered 2022-04-28: 11 [IU] via SUBCUTANEOUS
  Administered 2022-04-29 (×2): 7 [IU] via SUBCUTANEOUS
  Filled 2022-04-27 (×8): qty 1

## 2022-04-27 MED ORDER — INSULIN GLARGINE-YFGN 100 UNIT/ML ~~LOC~~ SOLN: 15.0000 [IU] | Freq: Every day | SUBCUTANEOUS | Status: DC

## 2022-04-27 MED ORDER — SODIUM CHLORIDE 0.45 % IV SOLN: INTRAVENOUS | Status: DC

## 2022-04-27 MED ORDER — GLUCERNA SHAKE PO LIQD
237.0000 mL | Freq: Two times a day (BID) | ORAL | Status: DC
  Administered 2022-04-28 – 2022-04-29 (×3): 237 mL via ORAL

## 2022-04-27 MED ORDER — INSULIN GLARGINE-YFGN 100 UNIT/ML ~~LOC~~ SOLN
20.0000 [IU] | Freq: Every day | SUBCUTANEOUS | Status: DC
  Administered 2022-04-28 – 2022-04-29 (×2): 20 [IU] via SUBCUTANEOUS
  Filled 2022-04-27 (×2): qty 0.2

## 2022-04-27 MED ORDER — INSULIN ASPART 100 UNIT/ML IJ SOLN
2.0000 [IU] | Freq: Three times a day (TID) | INTRAMUSCULAR | Status: DC
  Administered 2022-04-27: 2 [IU] via SUBCUTANEOUS
  Filled 2022-04-27: qty 1

## 2022-04-27 MED ORDER — LIVING WELL WITH DIABETES BOOK
Freq: Once | Status: AC
  Filled 2022-04-27: qty 1

## 2022-04-27 MED ORDER — POTASSIUM CHLORIDE 20 MEQ PO PACK
40.0000 meq | PACK | Freq: Two times a day (BID) | ORAL | Status: AC
  Administered 2022-04-27 (×2): 40 meq via ORAL
  Filled 2022-04-27 (×2): qty 2

## 2022-04-27 MED ORDER — ENSURE MAX PROTEIN PO LIQD
11.0000 [oz_av] | Freq: Every day | ORAL | Status: DC
  Filled 2022-04-27: qty 330

## 2022-04-27 MED ORDER — INSULIN STARTER KIT- PEN NEEDLES (ENGLISH)
1.0000 | Freq: Once | Status: AC
  Administered 2022-04-27: 1
  Filled 2022-04-27: qty 1

## 2022-04-27 NOTE — Plan of Care (Signed)
  RD consulted for nutrition education regarding diabetes.   Lab Results  Component Value Date   HGBA1C 12.1 (H) 04/26/2022   Case discussed with DM coordinator. Libre sensor has been placed on pt and outpatient diabetes education has been ordered.   Spoke with pt and family members at bedside. Pt with history of pre-diabetes and large family history of DM. Pt able to teach back basic DM self-management discussed by diabetes coordinator. Pt reports that she started radiation treatments last month and intake has decreased secondary to metallic taste in her mouth. Pt consumes a Premier Protein shake in the morning, a can of soup and Pedialyte at lunch, and a meat, starch, and vegetable at night. 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 Spent most of the visit discussing ways to maximize nutrition, DM self-management, and low calorie beverages. Pt feels overwhelmed by information, but is motivated and has a good support system. Pt expressed appreciation for visit.   RD provided "Carbohydrate Counting for People with Diabetes" handout from the Academy of Nutrition and Dietetics. Discussed different food groups and their effects on blood sugar, emphasizing carbohydrate-containing foods. Provided list of carbohydrates and recommended serving sizes of common foods.  Discussed importance of controlled and consistent carbohydrate intake throughout the day. Provided examples of ways to balance meals/snacks and encouraged intake of high-fiber, whole grain complex carbohydrates. Teach back method used.  Expect fair to good compliance.  Please refer to nutrition assessment for further details and care plans.  Loistine Chance, RD, LDN, [place] Registered Dietitian II Certified Diabetes Care and Education Specialist Please refer to [facility] for RD and/or RD on-call/weekend/after hours pager

## 2022-04-27 NOTE — Progress Notes (Signed)
*  PRELIMINARY RESULTS* Echocardiogram 2D Echocardiogram has been performed.  Veronica Chandler 04/27/2022, 10:54 AM

## 2022-04-27 NOTE — Consult Note (Signed)
ANTICOAGULATION CONSULT NOTE  Pharmacy Consult for Heparin infusion Indication: chest pain/ACS  No Known Allergies  Patient Measurements: Weight: 115.8 kg (255 lb 4.7 oz) Heparin Dosing Weight: 75.4 kg  Vital Signs: Temp: 98.1 F (36.7 C) (02/23 0000) Temp Source: Oral (02/23 0000) BP: 96/62 (02/23 0700) Pulse Rate: 91 (02/23 0700)  Labs: Recent Labs    04/26/22 0742 04/26/22 1437 04/26/22 1653 04/26/22 2216 04/27/22 0351 04/27/22 0808 04/27/22 0823  HGB 15.0  --   --   --  11.6*  --   --   HCT 48.0*  --   --   --  34.9*  --   --   PLT 247  --   --   --  214  --   --   APTT  --  23*  --   --   --   --   --   LABPROT  --  15.3*  --   --   --   --   --   INR  --  1.2  --   --   --   --   --   HEPARINUNFRC  --   --   --  0.96*  --   --  0.67  CREATININE 2.33* 1.50*  --  1.16* 1.22* 1.21*  --   TROPONINIHS 257* 2,769* 2,247*  --   --   --   --      Estimated Creatinine Clearance: 63.2 mL/min (A) (by C-G formula based on SCr of 1.21 mg/dL (H)).   Medical History: Past Medical History:  Diagnosis Date   Breast cancer in situ    ER+; DCIS right breast   Medical history non-contributory    Pre-diabetes    Type 2 diabetes mellitus (HCC)     Medications:  No prior history of chronic AC use.  Assessment: 52 y.o. female with a past medical history of breast cancer status postmastectomy in December currently on radiation treatment for the past 2 weeks who presents to the emergency department for generalized fatigue and weakness.  Patient's troponin elevated to 257, not entirely clear the cause of the troponin elevation. No chest pain. EKG shows no concerning ST findings. Pharmacy has been consulted to initiate heparin as a precaution.   Baseline labs: aPTT 23, INR 1.2, Hgb 15, Plts 247   Goal of Therapy:  Heparin level 0.3-0.7 units/ml Monitor platelets by anticoagulation protocol: Yes   2/22 2216 HL 0.96, supratherapeutic 2/23 0823 HL 0.67, therapeutic x  1  Plan:  Continue heparin infusion at 800 un/hr Recheck confirmatory HL in 6 hrs Monitor CBC daily while on heparin  Will M. Ouida Sills, PharmD PGY-1 Pharmacy Resident 04/27/2022 8:56 AM

## 2022-04-27 NOTE — Progress Notes (Signed)
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  She reports poor appetite and decreased oral intake.  Patient was diagnosed with bacterial vaginosis and is started on Flagyl on 12/22.  Vaginal discharge has resolved.  She is undergoing radiation for breast cancer.  She was admitted with DKA, hypernatremia, AKI, Demand ischemia     Assessment & Plan:   Principal Problem:   DKA (diabetic ketoacidosis) ([place]) Active Problems:   Diabetes mellitus without complication ([place])   Myocardial injury   Breast cancer in situ   Hypernatremia   AKI (acute kidney injury) ([place])   Dehydration   Hypercalcemia   Leukocytosis   Bacterial vaginosis   Morbid obesity ([place])   1-DKA, diabetes type 2: -Presents with hyperglycemia, CBG 869, bicarb 16, anion gap 32. -She was treated with insulin drip and IV fluids -She has been transitioned to Levemir, 2 units meal coverage and a sliding scale insulin -She will likely need to be discharged on insulin due to new diagnosis of DKA -A1c pending  Demand Ischemia;  EKG sinus tachycardia, PVC.  Cardiology consulted.  ECHO ordered.  Further evaluation per cardiology On Heparin gtt.  Check chest x ray   Hypernatremia: Continue with half NS\ Encourage water intake.  Sodium decreasing.   Hypercalcemia;  In the setting of dehydration Resolved.  PTH: 35 Normal  , phosphorus 3.0 and vitamin D 37 normal  PTH related peptide pending.   AKI; cr peak to 1.6 In setting hypovolemia;  Resolved with IV fluids.   Anemia: Plan to check anemia panel in a.m.  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   Weight as of this encounter: 115.8 kg.   DVT prophylaxis: Heparin gtt Code Status: Full code Family Communication: Care discussed with patient.  Disposition Plan:  Status is: Inpatient Remains inpatient appropriate because: management of DKA< elevation troponin     Consultants:  Cardiology   Procedures:  ECHO  Antimicrobials:    Subjective: She denies chest pain. She does report SOB on exertion.  She denies abdominal pain   Objective: Vitals:   04/27/22 0400 04/27/22 0500 04/27/22 0600 04/27/22 0700  BP:    96/62  Pulse: (!) 107 (!) 102 (!) 106 91  Resp: '18 17  16  '$ Temp:      TempSrc:      SpO2: 94% 97% 95% 97%  Weight:        Intake/Output Summary (Last 24 hours) at 04/27/2022 0843 Last data filed at 04/27/2022 0700 Gross per 24 hour  Intake 3277.01 ml  Output 1200 ml  Net 2077.01 ml   Filed Weights   04/26/22 1258  Weight: 115.8 kg    Examination:  General exam: Appears calm and comfortable  Respiratory system: Clear to auscultation. Respiratory effort normal. Cardiovascular system: S1 & S2 heard, RRR. No JVD, murmurs, rubs, gallops or clicks. No pedal edema. Gastrointestinal system: Abdomen  is nondistended, soft and nontender. No organomegaly or masses felt. Normal bowel sounds heard. Central nervous system: Alert and oriented.  Extremities: Symmetric 5 x 5 power.   Data Reviewed: I have personally reviewed following labs and imaging studies  CBC: Recent Labs  Lab 04/26/22 0742 04/27/22 0351  WBC 13.6* 7.9  HGB 15.0 11.6*  HCT 48.0* 34.9*  MCV 91.3 87.3  PLT 247 [id number]   Basic Metabolic Panel: Recent Labs  Lab  04/26/22 0742 04/26/22 1437 04/26/22 2216 04/27/22 0351 04/27/22 0808  NA 162* 161* 158* 155* 153*  K 4.7 4.0 3.4* 3.5 3.9  CL 114* 117* 122* 120* 119*  CO2 16* '22 26 28 23  '$ GLUCOSE 869* 336* 199* 169* 162*  BUN 41* 30* 22* 18 16  CREATININE 2.33* 1.50* 1.16* 1.22* 1.21*  CALCIUM 11.0* 10.1 9.2 8.9 8.3*  PHOS  --  3.0  --   --   --    GFR: Estimated Creatinine Clearance: 63.2 mL/min (A) (by C-G formula based on SCr of 1.21 mg/dL (H)). Liver Function Tests: Recent Labs  Lab 04/26/22 0742  AST 35  ALT 23  ALKPHOS 117  BILITOT 1.9*  PROT 9.3*  ALBUMIN 4.3   No results for input(s): "LIPASE", "AMYLASE" in the last 168 hours. No results for input(s): "AMMONIA" in the last 168 hours. Coagulation Profile: Recent Labs  Lab 04/26/22 1437  INR 1.2   Cardiac Enzymes: No results for input(s): "CKTOTAL", "CKMB", "CKMBINDEX", "TROPONINI" in the last 168 hours. BNP (last 3 results) No results for input(s): "PROBNP" in the last 8760 hours. HbA1C: Recent Labs    04/26/22 1203  HGBA1C 12.1*   CBG: Recent Labs  Lab 04/27/22 0110 04/27/22 0304 04/27/22 0504 04/27/22 0641 04/27/22 0746  GLUCAP 170* 161* 150* 123* 149*   Lipid Profile: Recent Labs    04/27/22 0351  CHOL 105  HDL 40*  LDLCALC 48  TRIG 86  CHOLHDL 2.6   Thyroid Function Tests: Recent Labs    04/26/22 0742  TSH 0.836   Anemia Panel: No results for input(s): "VITAMINB12", "FOLATE", "FERRITIN", "TIBC", "IRON", "RETICCTPCT" in the last 72 hours. Sepsis Labs: No results for input(s): "PROCALCITON", "LATICACIDVEN" in the last 168 hours.  Recent Results (from the past 240 hour(s))  Resp panel by RT-PCR (RSV, Flu A&B, Covid) Anterior Nasal Swab     Status: None   Collection Time: 04/26/22  7:42 AM   Specimen: Anterior Nasal Swab  Result Value Ref Range Status   SARS Coronavirus 2 by RT PCR NEGATIVE NEGATIVE Final    Comment: (NOTE) SARS-CoV-2 target nucleic acids are NOT DETECTED.  The  SARS-CoV-2 RNA is generally detectable in upper respiratory specimens during the acute phase of infection. The lowest concentration of SARS-CoV-2 viral copies this assay can detect is 138 copies/mL. A negative result does not preclude SARS-Cov-2 infection and should not be used as the sole basis for treatment or other patient management decisions. A negative result may occur with  improper specimen collection/handling, submission of specimen other than nasopharyngeal swab, presence of viral mutation(s) within the areas targeted by this assay, and inadequate number of viral copies(<138 copies/mL). A negative result must be combined with clinical observations, patient history, and epidemiological information. The expected result is Negative.  Fact Sheet for Patients:  EntrepreneurPulse.com.au  Fact Sheet for Healthcare Providers:  IncredibleEmployment.be  This test is no t yet approved or cleared by the [place] FDA and  has been authorized for detection and/or diagnosis of SARS-CoV-2  by FDA under an Emergency Use Authorization (EUA). This EUA will remain  in effect (meaning this test can be used) for the duration of the COVID-19 declaration under Section 564(b)(1) of the Act, 21 U.S.C.section 360bbb-3(b)(1), unless the authorization is terminated  or revoked sooner.       Influenza A by PCR NEGATIVE NEGATIVE Final   Influenza B by PCR NEGATIVE NEGATIVE Final    Comment: (NOTE) The Xpert Xpress SARS-CoV-2/FLU/RSV plus assay is intended as an aid in the diagnosis of influenza from Nasopharyngeal swab specimens and should not be used as a sole basis for treatment. Nasal washings and aspirates are unacceptable for Xpert Xpress SARS-CoV-2/FLU/RSV testing.  Fact Sheet for Patients: EntrepreneurPulse.com.au  Fact Sheet for Healthcare Providers: IncredibleEmployment.be  This test is not yet approved or  cleared by the [place] FDA and has been authorized for detection and/or diagnosis of SARS-CoV-2 by FDA under an Emergency Use Authorization (EUA). This EUA will remain in effect (meaning this test can be used) for the duration of the COVID-19 declaration under Section 564(b)(1) of the Act, 21 U.S.C. section 360bbb-3(b)(1), unless the authorization is terminated or revoked.     Resp Syncytial Virus by PCR NEGATIVE NEGATIVE Final    Comment: (NOTE) Fact Sheet for Patients: EntrepreneurPulse.com.au  Fact Sheet for Healthcare Providers: IncredibleEmployment.be  This test is not yet approved or cleared by the [place] FDA and has been authorized for detection and/or diagnosis of SARS-CoV-2 by FDA under an Emergency Use Authorization (EUA). This EUA will remain in effect (meaning this test can be used) for the duration of the COVID-19 declaration under Section 564(b)(1) of the Act, 21 U.S.C. section 360bbb-3(b)(1), unless the authorization is terminated or revoked.  Performed at [facility], [address] [place], [place] [address]   MRSA Next Gen by PCR, Nasal     Status: None   Collection Time: 04/26/22  1:04 PM   Specimen: Nasal Mucosa; Nasal Swab  Result Value Ref Range Status   MRSA by PCR Next Gen NOT DETECTED NOT DETECTED Final    Comment: (NOTE) The GeneXpert MRSA Assay (FDA approved for NASAL specimens only), is one component of a comprehensive MRSA colonization surveillance program. It is not intended to diagnose MRSA infection nor to guide or monitor treatment for MRSA infections. Test performance is not FDA approved in patients less than 35 years old. Performed at [facility], [address] [place], [place] [address]          Radiology Studies: No results found.      Scheduled Meds:  aspirin  324 mg Oral Once   aspirin EC  81 mg Oral Daily   atorvastatin  40 mg Oral QPM    Chlorhexidine Gluconate Cloth  6 each Topical Daily   fluticasone  1 spray Each Nare Daily   insulin aspart  0-20 Units Subcutaneous TID WC   [START ON 04/28/2022] insulin glargine-yfgn  15 Units Subcutaneous Daily   insulin starter kit- pen needles  1 kit Other Once   living well with diabetes book   Does not apply Once   loratadine  5 mg Oral Daily   metroNIDAZOLE  500 mg Oral BID   multivitamin with minerals  1 tablet Oral Daily   potassium chloride  40 mEq Oral BID   Continuous Infusions:  sodium chloride 125 mL/hr at 04/27/22 0700   sodium chloride Stopped (04/27/22 0559)   heparin 800 Units/hr (04/27/22 0700)     LOS: 1  day    Time spent: 35 minutes    Elmarie Shiley, MD [facility]   If 7PM-7AM, please contact night-coverage [email or web address]  04/27/2022, 8:43 AM

## 2022-04-27 NOTE — Progress Notes (Addendum)
Initial Nutrition Assessment  DOCUMENTATION CODES:   Morbid obesity  INTERVENTION:   -Liberalize diet to carb modified for wider variety of meal selections -MVI with minerals daily -Glucerna Shake po BID, each supplement provides 220 kcal and 10 grams of protein  -Ensure Max po daily, each supplement provides 150 kcal and 30 grams of protein -RD provided education on DM diet and self-management; provided "Carbohydrate Counting for People with Diabetes" and "Plate Method" handout from [facility]'s Nutrition Care Manual -Referred to [place]'s [facility] and [facility] for further education and reinforcement   NUTRITION DIAGNOSIS:   Increased nutrient needs related to cancer and cancer related treatments as evidenced by estimated needs.  GOAL:   Patient will meet greater than or equal to 90% of their needs  MONITOR:   PO intake, Supplement acceptance  REASON FOR ASSESSMENT:   Malnutrition Screening Tool    ASSESSMENT:   Pt with medical history significant of DCIS (s/p of right mastectomy, on radiation therapy), bacterial vaginosis, diabetes mellitus, who presents with generalized weakness.  Pt admitted with DKA.   Reviewed I/O's: +2.1 L x 24 hours  UOP: 1.2 L x 24 hours  Spoke with pt and family members at bedside. Pt feels overwhelmed secondary to hospitalization and new DM diagnosis. She has not eaten much today secondary to multiple interruptions and testing. Typical meal intake is 3 meals per day (Breakfast: Hydrographic surveyor; Lunch: can of soup and Pedialyte, Dinner: meat, starch, and vegetable). Of note, pt has had decreased oral intake secondary to metallic taste in mouth as a result of radiation treatments. Noted meal completions 75%.    Per pt, her UBW is around 260#. She endorses large amount of weight loss over the past month, which she attributes to decreased oral intake due to metallic taste in mouth as a result of radiation treatments. Suspect  new DM diagnosis may also be contributing to weight loss. Reviewed wt hx; 5.7% wt loss over the past month, which is significant for time frame.   Discussed importance of good meal and supplement intake to promote healing. Pt amenable to supplements. RD provided Ensure Max supplement per request.   Case discussed with MD coordinator; outpatient referral has been ordered.   RD provided "Carbohydrate Counting for People with Diabetes" handout from the Academy of Nutrition and Dietetics. Discussed different food groups and their effects on blood sugar, emphasizing carbohydrate-containing foods. Provided list of carbohydrates and recommended serving sizes of common foods.  Discussed importance of controlled and consistent carbohydrate intake throughout the day. Provided examples of ways to balance meals/snacks and encouraged intake of high-fiber, whole grain complex carbohydrates. Teach back method used. Expect fair to good compliance.  Obesity is a complex, chronic medical condition that is optimally managed by a multidisciplinary care team. Weight loss is not an ideal goal for an acute inpatient hospitalization. However, if further work-up for obesity is warranted, consider outpatient referral to [place]'s Nutrition and Diabetes Education [facility].    Medications reviewed and include potassium chloride and 0-45% sodium chloride infusion @ 100 ml/hr.   Lab Results  Component Value Date   HGBA1C 12.1 (H) 04/26/2022   PTA DM medications are none.   Labs reviewed: Na: 153, CBGS: 337 (inpatient orders for glycemic control are 0-20 units insulin aspart TID with meals, 2 units insulin aspart TID with meals, and 15 units insulin glargine-yfgn daily).    NUTRITION - FOCUSED PHYSICAL EXAM:  Flowsheet Row Most Recent Value  Orbital Region No depletion  Upper Arm Region No depletion  Thoracic and Lumbar Region No depletion  Buccal Region No depletion  Temple Region No depletion  Clavicle Bone  Region No depletion  Clavicle and Acromion Bone Region No depletion  Scapular Bone Region No depletion  Dorsal Hand No depletion  Patellar Region No depletion  Anterior Thigh Region No depletion  Posterior Calf Region No depletion  Edema (RD Assessment) Mild  Hair Reviewed  Eyes Reviewed  Mouth Reviewed  Skin Reviewed  Nails Reviewed       Diet Order:   Diet Order             Diet heart healthy/carb modified Room service appropriate? Yes; Fluid consistency: Thin  Diet effective now                   EDUCATION NEEDS:   Education needs have been addressed  Skin:  Skin Assessment: Reviewed RN Assessment  Last BM:  Unknown  Height:   Ht Readings from Last 1 Encounters:  03/01/22 5' (1.524 m)    Weight:   Wt Readings from Last 1 Encounters:  04/26/22 115.8 kg    Ideal Body Weight:  45.5 kg  BMI:  Body mass index is 49.86 kg/m.  Estimated Nutritional Needs:   Kcal:  1600-1800  Protein:  75-90 grams  Fluid:  > 1.6 L    Loistine Chance, RD, LDN, [place] Registered Dietitian II Certified Diabetes Care and Education Specialist Please refer to [facility] for RD and/or RD on-call/weekend/after hours pager

## 2022-04-27 NOTE — Consult Note (Signed)
ANTICOAGULATION CONSULT NOTE  Pharmacy Consult for Heparin infusion Indication: chest pain/ACS  No Known Allergies  Patient Measurements: Weight: 115.8 kg (255 lb 4.7 oz) Heparin Dosing Weight: 75.4 kg  Vital Signs: Temp: 98.2 F (36.8 C) (02/23 0800) Temp Source: Oral (02/23 0800) BP: 138/78 (02/23 0825) Pulse Rate: 96 (02/23 0800)  Labs: Recent Labs    04/26/22 0742 04/26/22 1437 04/26/22 1653 04/26/22 2216 04/27/22 0351 04/27/22 0808 04/27/22 0823  HGB 15.0  --   --   --  11.6*  --   --   HCT 48.0*  --   --   --  34.9*  --   --   PLT 247  --   --   --  214  --   --   APTT  --  23*  --   --   --   --   --   LABPROT  --  15.3*  --   --   --   --   --   INR  --  1.2  --   --   --   --   --   HEPARINUNFRC  --   --   --  0.96*  --   --  0.67  CREATININE 2.33* 1.50*  --  1.16* 1.22* 1.21*  --   TROPONINIHS 257* 2,769* 2,247*  --   --   --   --      Estimated Creatinine Clearance: 63.2 mL/min (A) (by C-G formula based on SCr of 1.21 mg/dL (H)).   Medical History: Past Medical History:  Diagnosis Date   Breast cancer in situ    ER+; DCIS right breast   Medical history non-contributory    Pre-diabetes    Type 2 diabetes mellitus (HCC)     Medications:  No prior history of chronic AC use.  Assessment: 51 y.o. female with a past medical history of breast cancer status postmastectomy in December currently on radiation treatment for the past 2 weeks who presents to the emergency department for generalized fatigue and weakness.  Patient's troponin elevated to 257, not entirely clear the cause of the troponin elevation. No chest pain. EKG shows no concerning ST findings. Pharmacy has been consulted to initiate heparin as a precaution.   Baseline labs: aPTT 23, INR 1.2, Hgb 15, Plts 247   Goal of Therapy:  Heparin level 0.3-0.7 units/ml Monitor platelets by anticoagulation protocol: Yes    Plan: heparin level therapeutic x 2 Continue heparin infusion at 800  units/hr Will move to once daily heparin level checks: next 04/28/22 am Monitor CBC daily while on heparin  Vallery Sa, PharmD, BCPS 04/27/2022 9:55 AM

## 2022-04-27 NOTE — Consult Note (Addendum)
Veronica Chandler is a 52 y.o. female  [id number]  Primary Cardiologist: Neoma Laming, MD Reason for Consultation: NSTEMI  HPI: Patient is a 52 year old female with past medical history of breast cancer s/p right mastectomy in December 2023 currently on radiation treatment, no chemo. Patient presented to the ED on 04/26/22 with chief complaint of weakness for the previous 2-3 days. Patient admitted to decreased eating and drinking due to fatigue. Patient found to be tachycardic and in DKA.    Patient troponin levels 257>2,769>2,247.   Review of Systems: denies chest pain, shortness of breath   Past Medical History:  Diagnosis Date   Breast cancer in situ    ER+; DCIS right breast   Medical history non-contributory    Pre-diabetes    Type 2 diabetes mellitus ([place])     Medications Prior to Admission  Medication Sig Dispense Refill   fluticasone (FLONASE) 50 MCG/ACT nasal spray SPRAY 2 SPRAYS INTO EACH NOSTRIL EVERY DAY 16 mL 0   HYDROcodone-acetaminophen (NORCO) 5-325 MG tablet Take 1 tablet by mouth every 6 (six) hours as needed for up to 6 doses for moderate pain. 6 tablet 0   ibuprofen (ADVIL) 800 MG tablet Take 1 tablet (800 mg total) by mouth every 8 (eight) hours as needed for mild pain or moderate pain. 30 tablet 0   levocetirizine (XYZAL) 5 MG tablet TAKE 1 TABLET BY MOUTH EVERY DAY IN THE EVENING (Patient taking differently: Take 5 mg by mouth every morning.) 90 tablet 1   lidocaine-prilocaine (EMLA) cream Apply 1 Application topically once.     metroNIDAZOLE (FLAGYL) 500 MG tablet Take 1 tablet (500 mg total) by mouth 2 (two) times daily. 14 tablet 0   Multiple Vitamin (MULTIVITAMIN WITH MINERALS) TABS tablet Take 1 tablet by mouth daily.     sucralfate (CARAFATE) 1 g tablet Take 1 tablet (1 g total) by mouth 4 (four) times daily -  with meals and at bedtime. Dissolve in 3 to 4 T warm water swish and swallow before meals. 90 tablet 0      aspirin  324 mg Oral Once    aspirin EC  81 mg Oral Daily   atorvastatin  40 mg Oral QPM   Chlorhexidine Gluconate Cloth  6 each Topical Daily   fluticasone  1 spray Each Nare Daily   insulin aspart  0-20 Units Subcutaneous TID WC   [START ON 04/28/2022] insulin glargine-yfgn  15 Units Subcutaneous Daily   insulin starter kit- pen needles  1 kit Other Once   living well with diabetes book   Does not apply Once   loratadine  5 mg Oral Daily   metroNIDAZOLE  500 mg Oral BID   multivitamin with minerals  1 tablet Oral Daily   potassium chloride  40 mEq Oral BID    Infusions:  sodium chloride 125 mL/hr at 04/27/22 0700   sodium chloride Stopped (04/27/22 0559)   heparin 800 Units/hr (04/27/22 0700)    No Known Allergies  Social History   Socioeconomic History   Marital status: Divorced    Spouse name: Not on file   Number of children: Not on file   Years of education: Not on file   Highest education level: Not on file  Occupational History   Not on file  Tobacco Use   Smoking status: Never   Smokeless tobacco: Never  Vaping Use   Vaping Use: Never used  Substance and Sexual Activity   Alcohol use:  Yes    Comment: social   Drug use: Yes    Types: Marijuana    Comment: occasional   Sexual activity: Not on file  Other Topics Concern   Not on file  Social History Narrative   Not on file   Social Determinants of Health   Financial Resource Strain: Not on file  Food Insecurity: No Food Insecurity (04/26/2022)   Hunger Vital Sign    Worried About Running Out of Food in the Last Year: Never true    Ran Out of Food in the Last Year: Never true  Transportation Needs: No Transportation Needs (04/26/2022)   PRAPARE - Hydrologist (Medical): No    Lack of Transportation (Non-Medical): No  Physical Activity: Not on file  Stress: Not on file  Social Connections: Not on file  Intimate Partner Violence: Not At Risk (04/26/2022)   Humiliation, Afraid, Rape, and Kick  questionnaire    Fear of Current or Ex-Partner: No    Emotionally Abused: No    Physically Abused: No    Sexually Abused: No    Family History  Problem Relation Age of Onset   Diabetes Father    Prostate cancer Father    Lung cancer Maternal Aunt    Pancreatic cancer Maternal Aunt    Lung cancer Paternal Aunt    Diabetes Paternal Grandmother    Breast cancer Neg Hx    Colon cancer Neg Hx    Ovarian cancer Neg Hx     PHYSICAL EXAM: Vitals:   04/27/22 0600 04/27/22 0700  BP:  96/62  Pulse: (!) 106 91  Resp:  16  Temp:    SpO2: 95% 97%     Intake/Output Summary (Last 24 hours) at 04/27/2022 0849 Last data filed at 04/27/2022 0700 Gross per 24 hour  Intake 3277.01 ml  Output 1200 ml  Net 2077.01 ml    General:  Well appearing. No respiratory difficulty HEENT: normal Neck: supple. no JVD. Carotids 2+ bilat; no bruits. No lymphadenopathy or thryomegaly appreciated. Cor: PMI nondisplaced. Regular rate & rhythm. No rubs, gallops or murmurs. Lungs: clear Abdomen: soft, nontender, nondistended. No hepatosplenomegaly. No bruits or masses. Good bowel sounds. Extremities: no cyanosis, clubbing, rash, edema Neuro: alert & oriented x 3, cranial nerves grossly intact. moves all 4 extremities w/o difficulty. Affect pleasant.  ECG: No EKG available in the chart. Telemetry normal sinus rhythm, HR 98 bpm  Results for orders placed or performed during the hospital encounter of 04/26/22 (from the past 24 hour(s))  CBG monitoring, ED     Status: Abnormal   Collection Time: 04/26/22  9:24 AM  Result Value Ref Range   Glucose-Capillary >600 (HH) 70 - 99 mg/dL  CBG monitoring, ED     Status: Abnormal   Collection Time: 04/26/22 10:10 AM  Result Value Ref Range   Glucose-Capillary 570 (HH) 70 - 99 mg/dL   Comment 1 Document in Chart   CBG monitoring, ED     Status: Abnormal   Collection Time: 04/26/22 10:41 AM  Result Value Ref Range   Glucose-Capillary 488 (H) 70 - 99 mg/dL  CBG  monitoring, ED     Status: Abnormal   Collection Time: 04/26/22 11:18 AM  Result Value Ref Range   Glucose-Capillary 345 (H) 70 - 99 mg/dL  Beta-hydroxybutyric acid     Status: Abnormal   Collection Time: 04/26/22 11:59 AM  Result Value Ref Range   Beta-Hydroxybutyric Acid >8.00 (H) 0.05 - 0.27  mmol/L  Osmolality     Status: Abnormal   Collection Time: 04/26/22 12:03 PM  Result Value Ref Range   Osmolality 386 (HH) 275 - 295 mOsm/kg  Hemoglobin A1c     Status: Abnormal   Collection Time: 04/26/22 12:03 PM  Result Value Ref Range   Hgb A1c MFr Bld 12.1 (H) 4.8 - 5.6 %   Mean Plasma Glucose 300.57 mg/dL  CBG monitoring, ED     Status: Abnormal   Collection Time: 04/26/22 12:18 PM  Result Value Ref Range   Glucose-Capillary 449 (H) 70 - 99 mg/dL  Glucose, capillary     Status: Abnormal   Collection Time: 04/26/22 12:55 PM  Result Value Ref Range   Glucose-Capillary 348 (H) 70 - 99 mg/dL  MRSA Next Gen by PCR, Nasal     Status: None   Collection Time: 04/26/22  1:04 PM   Specimen: Nasal Mucosa; Nasal Swab  Result Value Ref Range   MRSA by PCR Next Gen NOT DETECTED NOT DETECTED  Glucose, capillary     Status: Abnormal   Collection Time: 04/26/22  1:56 PM  Result Value Ref Range   Glucose-Capillary 328 (H) 70 - 99 mg/dL  HIV Antibody (routine testing w rflx)     Status: None   Collection Time: 04/26/22  2:37 PM  Result Value Ref Range   HIV Screen 4th Generation wRfx Non Reactive Non Reactive  Basic metabolic panel     Status: Abnormal   Collection Time: 04/26/22  2:37 PM  Result Value Ref Range   Sodium 161 (HH) 135 - 145 mmol/L   Potassium 4.0 3.5 - 5.1 mmol/L   Chloride 117 (H) 98 - 111 mmol/L   CO2 22 22 - 32 mmol/L   Glucose, Bld 336 (H) 70 - 99 mg/dL   BUN 30 (H) 6 - 20 mg/dL   Creatinine, Ser 1.50 (H) 0.44 - 1.00 mg/dL   Calcium 10.1 8.9 - 10.3 mg/dL   GFR, Estimated 42 (L) >60 mL/min   Anion gap 22 (H) 5 - 15  Troponin I (High Sensitivity)     Status: Abnormal    Collection Time: 04/26/22  2:37 PM  Result Value Ref Range   Troponin I (High Sensitivity) 2,769 (HH) <18 ng/L  Protime-INR     Status: Abnormal   Collection Time: 04/26/22  2:37 PM  Result Value Ref Range   Prothrombin Time 15.3 (H) 11.4 - 15.2 seconds   INR 1.2 0.8 - 1.2  APTT     Status: Abnormal   Collection Time: 04/26/22  2:37 PM  Result Value Ref Range   aPTT 23 (L) 24 - 36 seconds  Phosphorus     Status: None   Collection Time: 04/26/22  2:37 PM  Result Value Ref Range   Phosphorus 3.0 2.5 - 4.6 mg/dL  Glucose, capillary     Status: Abnormal   Collection Time: 04/26/22  2:57 PM  Result Value Ref Range   Glucose-Capillary 274 (H) 70 - 99 mg/dL  Glucose, capillary     Status: Abnormal   Collection Time: 04/26/22  3:57 PM  Result Value Ref Range   Glucose-Capillary 241 (H) 70 - 99 mg/dL  Troponin I (High Sensitivity)     Status: Abnormal   Collection Time: 04/26/22  4:53 PM  Result Value Ref Range   Troponin I (High Sensitivity) 2,247 (HH) <18 ng/L  Glucose, capillary     Status: Abnormal   Collection Time: 04/26/22  5:00 PM  Result Value Ref Range   Glucose-Capillary 209 (H) 70 - 99 mg/dL  Glucose, capillary     Status: Abnormal   Collection Time: 04/26/22  5:59 PM  Result Value Ref Range   Glucose-Capillary 227 (H) 70 - 99 mg/dL  Osmolality, urine     Status: None   Collection Time: 04/26/22  6:35 PM  Result Value Ref Range   Osmolality, Ur 543 300 - 900 mOsm/kg  Sodium, urine, random     Status: None   Collection Time: 04/26/22  6:35 PM  Result Value Ref Range   Sodium, Ur 21 mmol/L  Urine Drug Screen, Qualitative ([facility] only)     Status: None   Collection Time: 04/26/22  6:35 PM  Result Value Ref Range   Tricyclic, Ur Screen NONE DETECTED NONE DETECTED   Amphetamines, Ur Screen NONE DETECTED NONE DETECTED   MDMA (Ecstasy)Ur Screen NONE DETECTED NONE DETECTED   Cocaine Metabolite,Ur [place] NONE DETECTED NONE DETECTED   Opiate, Ur Screen NONE DETECTED NONE  DETECTED   Phencyclidine (PCP) Ur S NONE DETECTED NONE DETECTED   Cannabinoid 50 Ng, Ur [place] NONE DETECTED NONE DETECTED   Barbiturates, Ur Screen NONE DETECTED NONE DETECTED   Benzodiazepine, Ur Scrn NONE DETECTED NONE DETECTED   Methadone Scn, Ur NONE DETECTED NONE DETECTED  Urinalysis, Complete w Microscopic -Urine, Clean Catch     Status: Abnormal   Collection Time: 04/26/22  6:35 PM  Result Value Ref Range   Color, Urine YELLOW (A) YELLOW   APPearance CLEAR (A) CLEAR   Specific Gravity, Urine 1.020 1.005 - 1.030   pH 5.0 5.0 - 8.0   Glucose, UA >=500 (A) NEGATIVE mg/dL   Hgb urine dipstick NEGATIVE NEGATIVE   Bilirubin Urine NEGATIVE NEGATIVE   Ketones, ur 20 (A) NEGATIVE mg/dL   Protein, ur NEGATIVE NEGATIVE mg/dL   Nitrite NEGATIVE NEGATIVE   Leukocytes,Ua NEGATIVE NEGATIVE   RBC / HPF 0-5 0 - 5 RBC/hpf   WBC, UA 0-5 0 - 5 WBC/hpf   Bacteria, UA RARE (A) NONE SEEN   Squamous Epithelial / HPF 0-5 0 - 5 /HPF   Mucus PRESENT   Glucose, capillary     Status: Abnormal   Collection Time: 04/26/22  6:59 PM  Result Value Ref Range   Glucose-Capillary 206 (H) 70 - 99 mg/dL  Glucose, capillary     Status: Abnormal   Collection Time: 04/26/22  7:56 PM  Result Value Ref Range   Glucose-Capillary 205 (H) 70 - 99 mg/dL  Glucose, capillary     Status: Abnormal   Collection Time: 04/26/22  9:02 PM  Result Value Ref Range   Glucose-Capillary 171 (H) 70 - 99 mg/dL  Glucose, capillary     Status: Abnormal   Collection Time: 04/26/22 10:10 PM  Result Value Ref Range   Glucose-Capillary 182 (H) 70 - 99 mg/dL  Heparin level (unfractionated)     Status: Abnormal   Collection Time: 04/26/22 10:16 PM  Result Value Ref Range   Heparin Unfractionated 0.96 (H) 0.30 - 0.70 IU/mL  Basic metabolic panel     Status: Abnormal   Collection Time: 04/26/22 10:16 PM  Result Value Ref Range   Sodium 158 (H) 135 - 145 mmol/L   Potassium 3.4 (L) 3.5 - 5.1 mmol/L   Chloride 122 (H) 98 - 111 mmol/L    CO2 26 22 - 32 mmol/L   Glucose, Bld 199 (H) 70 - 99 mg/dL   BUN 22 (H) 6 - 20  mg/dL   Creatinine, Ser 1.16 (H) 0.44 - 1.00 mg/dL   Calcium 9.2 8.9 - 10.3 mg/dL   GFR, Estimated 57 (L) >60 mL/min   Anion gap 10 5 - 15  Glucose, capillary     Status: Abnormal   Collection Time: 04/26/22 11:03 PM  Result Value Ref Range   Glucose-Capillary 158 (H) 70 - 99 mg/dL  Glucose, capillary     Status: Abnormal   Collection Time: 04/26/22 11:58 PM  Result Value Ref Range   Glucose-Capillary 159 (H) 70 - 99 mg/dL  Glucose, capillary     Status: Abnormal   Collection Time: 04/27/22  1:10 AM  Result Value Ref Range   Glucose-Capillary 170 (H) 70 - 99 mg/dL  Blood gas, venous     Status: Abnormal   Collection Time: 04/27/22  1:17 AM  Result Value Ref Range   pH, Ven 7.53 (H) 7.25 - 7.43   pCO2, Ven 40 (L) 44 - 60 mmHg   pO2, Ven 109 (H) 32 - 45 mmHg   Bicarbonate 33.4 (H) 20.0 - 28.0 mmol/L   Acid-Base Excess 9.8 (H) 0.0 - 2.0 mmol/L   O2 Saturation 99.8 %   Patient temperature 37.0    Collection site VEIN   Glucose, capillary     Status: Abnormal   Collection Time: 04/27/22  3:04 AM  Result Value Ref Range   Glucose-Capillary 161 (H) 70 - 99 mg/dL  Basic metabolic panel     Status: Abnormal   Collection Time: 04/27/22  3:51 AM  Result Value Ref Range   Sodium 155 (H) 135 - 145 mmol/L   Potassium 3.5 3.5 - 5.1 mmol/L   Chloride 120 (H) 98 - 111 mmol/L   CO2 28 22 - 32 mmol/L   Glucose, Bld 169 (H) 70 - 99 mg/dL   BUN 18 6 - 20 mg/dL   Creatinine, Ser 1.22 (H) 0.44 - 1.00 mg/dL   Calcium 8.9 8.9 - 10.3 mg/dL   GFR, Estimated 53 (L) >60 mL/min   Anion gap 7 5 - 15  CBC     Status: Abnormal   Collection Time: 04/27/22  3:51 AM  Result Value Ref Range   WBC 7.9 4.0 - 10.5 K/uL   RBC 4.00 3.87 - 5.11 MIL/uL   Hemoglobin 11.6 (L) 12.0 - 15.0 g/dL   HCT 34.9 (L) 36.0 - 46.0 %   MCV 87.3 80.0 - 100.0 fL   MCH 29.0 26.0 - 34.0 pg   MCHC 33.2 30.0 - 36.0 g/dL   RDW 14.6 11.5 - 15.5 %    Platelets 214 150 - 400 K/uL   nRBC 0.4 (H) 0.0 - 0.2 %  Lipid panel     Status: Abnormal   Collection Time: 04/27/22  3:51 AM  Result Value Ref Range   Cholesterol 105 0 - 200 mg/dL   Triglycerides 86 <150 mg/dL   HDL 40 (L) >40 mg/dL   Total CHOL/HDL Ratio 2.6 RATIO   VLDL 17 0 - 40 mg/dL   LDL Cholesterol 48 0 - 99 mg/dL  Glucose, capillary     Status: Abnormal   Collection Time: 04/27/22  5:04 AM  Result Value Ref Range   Glucose-Capillary 150 (H) 70 - 99 mg/dL  Glucose, capillary     Status: Abnormal   Collection Time: 04/27/22  6:41 AM  Result Value Ref Range   Glucose-Capillary 123 (H) 70 - 99 mg/dL  Glucose, capillary     Status: Abnormal  Collection Time: 04/27/22  7:46 AM  Result Value Ref Range   Glucose-Capillary 149 (H) 70 - 99 mg/dL  Basic metabolic panel     Status: Abnormal   Collection Time: 04/27/22  8:08 AM  Result Value Ref Range   Sodium 153 (H) 135 - 145 mmol/L   Potassium 3.9 3.5 - 5.1 mmol/L   Chloride 119 (H) 98 - 111 mmol/L   CO2 23 22 - 32 mmol/L   Glucose, Bld 162 (H) 70 - 99 mg/dL   BUN 16 6 - 20 mg/dL   Creatinine, Ser 1.21 (H) 0.44 - 1.00 mg/dL   Calcium 8.3 (L) 8.9 - 10.3 mg/dL   GFR, Estimated 54 (L) >60 mL/min   Anion gap 11 5 - 15  Heparin level (unfractionated)     Status: None   Collection Time: 04/27/22  8:23 AM  Result Value Ref Range   Heparin Unfractionated 0.67 0.30 - 0.70 IU/mL   No results found.   ASSESSMENT AND PLAN: Patient presented to ED on 04/26/22 with complaints of fatigue. Patient admitted for DKA and had elevated troponin.. Patient denies chest pain, shortness of breath. Patient tachycardic on admission, HR improving. Elevated troponin levels likely due to demand ischemia. Will continue to follow.  Echo showed normal wall motion and normal LVEF. EKG shows sinus tachycardia, occasional PVC, but otherwise normal . Advise heparin for 48 hours, but unlikely would need invasive procedure such as cardiac cath. Will do out  patient workup. Engineer, drilling FNP-C

## 2022-04-27 NOTE — Discharge Instructions (Signed)

## 2022-04-27 NOTE — Progress Notes (Signed)
Patient alert and oriented x4, on room air, NSR, with stable pressures. Family at bedside. Diabetes booklet reviewed and provided to patient. EKG placed in chart.

## 2022-04-27 NOTE — Inpatient Diabetes Management (Addendum)
Inpatient Diabetes Program Recommendations  AACE/ADA: New Consensus Statement on Inpatient Glycemic Control (2015)  Target Ranges:  Prepandial:   less than 140 mg/dL      Peak postprandial:   less than 180 mg/dL (1-2 hours)      Critically ill patients:  140 - 180 mg/dL    Latest Reference Range & Units 04/26/22 07:42  Glucose 70 - 99 mg/dL 869 (HH)  (HH): Data is critically high  Latest Reference Range & Units 07/05/21 10:06 04/26/22 12:03  Hemoglobin A1C 4.8 - 5.6 % 6.6 (H) 12.1 (H)  300 mg/dl  (H): Data is abnormally high  Latest Reference Range & Units 04/26/22 21:02 04/26/22 22:10 04/26/22 23:03 04/26/22 23:58 04/27/22 01:10 04/27/22 03:04 04/27/22 05:04 04/27/22 06:41  Glucose-Capillary 70 - 99 mg/dL 171 (H)  IV Insulin Drip Infusing 182 (H) 158 (H) 159 (H) 170 (H) 161 (H) 150 (H)  15 units Semglee '@0505'$  123 (H)  IV Insulin drip Off at 0620  (H): Data is abnormally high  Admit with: Weakness/ DKA   History: Breast Cancer (mastectomy Dec 2023), Pre-Diabetes   Home DM Meds: None  Current Orders: Novolog Resistant Correction Scale/ SSI (0-20 units) TID AC    Sent Secure Chat to Pharmacy to check insulin coverage for home  Per Pharmacy: Lantus Insulin Pen is $0 co-pay Novolog Insulin Pen is $0 co-pay Pt can also get Freestyle Libre 3 Continuous Glucose meter for $75 per month with Coupon card  Lantus Insulin Pen Order # [id number] Novolog Insulin Pen Order # [id number] Insulin Pen Needles Order # [id number] CBG meter Kit: Order # [id number] Freestyle Libre 3 CGM Order # [id number]    Addendum 10:45am--Spoke with pt about new diagnosis (Mom at bedside present for discussion as well).  Discussed A1C results with pt and explained what an A1C is, basic pathophysiology of DM Type 2, basic home care, basic diabetes diet nutrition principles, importance of checking CBGs and maintaining good CBG control to prevent long-term and short-term complications.  Reviewed signs and symptoms of  hyperglycemia and hypoglycemia and how to treat hypoglycemia at home.  Also reviewed blood sugar goals and A1c goals for home.    RNs to provide ongoing basic DM education at bedside with this patient.  Have ordered educational booklet, insulin starter kit.  Have asked RN to allow pt to give all  injections for practice.  Have also placed RD consult for DM diet education for this patient.  Also attached several educational QR codes to pt's AVS on diabetes, etc. And showed pt how to scan the QR codes to watch the videos.  Educated patient on insulin pen use at home.  Reviewed all steps of insulin pen including attachment of needle, 2-unit air shot, dialing up dose, giving injection, rotation of injection sites, removing needle, disposal of sharps, storage of unused insulin, disposal of insulin etc.  Patient able to provide successful return demonstration.  Reviewed troubleshooting with insulin pen.  Also reviewed Signs/Symptoms of Hypoglycemia with patient and how to treat Hypoglycemia at home.  Have asked RNs caring for patient to please allow patient to give all injections here in hospital as much as possible for practice.  MD to give patient Rxs for insulin pens and insulin pen needles.   Pt expressed interest in getting CGM to check sugars at home.  Orders given to me by Attending MD to start CGM on pt today and give one extra sensor for home--pt will need to seek refills thru her PCP  office--pt was educated about this.  Reviewed Freestyle Libre 3 CGM system (how to use, how to place new sensor, sensor life, troubleshooting, warm-up time, how to use app on phone, rotation of insertion sites, etc).  Assisted pt to download the Freestyle app to her iPhone.  Assisted pt to place [place] sensor on R Upper arm.  Pt knows they will able to scan for CBG 1 hr after sensor placed.  Pt instructed to replace sensor in 14 days.  Encouraged pt to scan for CBGs pre and post meals.  Reviewed healthy CBG goals for home.  Pt  asked to seek refills for the sensors from their PCP.  Pt educated to check fingerstick CBG with traditional CBG meter when glucose reading does not match how they feels.  RN made aware that CGM applied.     --Will follow patient during hospitalization--  Wyn Quaker RN, MSN, [place] Diabetes Coordinator Inpatient Glycemic Control Team Team Pager: [phone] (8a-5p)

## 2022-04-27 NOTE — Consult Note (Signed)
ANTICOAGULATION CONSULT NOTE  Pharmacy Consult for Heparin infusion Indication: chest pain/ACS  No Known Allergies  Patient Measurements: Weight: 115.8 kg (255 lb 4.7 oz) Heparin Dosing Weight: 75.4 kg  Vital Signs: Temp: 98.1 F (36.7 C) (02/23 0000) Temp Source: Oral (02/23 0000) BP: 133/72 (02/22 2300) Pulse Rate: 108 (02/22 2300)  Labs: Recent Labs    04/26/22 0742 04/26/22 1437 04/26/22 1653 04/26/22 2216  HGB 15.0  --   --   --   HCT 48.0*  --   --   --   PLT 247  --   --   --   APTT  --  23*  --   --   LABPROT  --  15.3*  --   --   INR  --  1.2  --   --   HEPARINUNFRC  --   --   --  0.96*  CREATININE 2.33* 1.50*  --  1.16*  TROPONINIHS 257* 2,769* 2,247*  --      Estimated Creatinine Clearance: 65.9 mL/min (A) (by C-G formula based on SCr of 1.16 mg/dL (H)).   Medical History: Past Medical History:  Diagnosis Date   Medical history non-contributory    Pre-diabetes     Medications:  No prior history of chronic AC use.  Assessment: 52 y.o. female with a past medical history of breast cancer status postmastectomy in December currently on radiation treatment for the past 2 weeks who presents to the emergency department for generalized fatigue and weakness.  Patient's troponin elevated to 257, not entirely clear the cause of the troponin elevation. No chest pain. EKG shows no concerning ST findings. Pharmacy has been consulted to initiate heparin as a precaution.   Baseline labs: aPTT pending, INR pending, Hgb 15, Plts 247   Goal of Therapy:  Heparin level 0.3-0.7 units/ml Monitor platelets by anticoagulation protocol: Yes   2/22 2216 HL 0.96, supratherapeutic  Plan:  Decrease heparin infusion to 800 units/hr Recheck HL at 0800 after rate change Continue to monitor H&H and platelets  Renda Rolls, PharmD, [facility] 04/27/2022 12:36 AM

## 2022-04-28 LAB — CBC
HCT: 33.2 % — ABNORMAL LOW (ref 36.0–46.0)
Hemoglobin: 10.7 g/dL — ABNORMAL LOW (ref 12.0–15.0)
MCH: 28.8 pg (ref 26.0–34.0)
MCHC: 32.2 g/dL (ref 30.0–36.0)
MCV: 89.2 fL (ref 80.0–100.0)
Platelets: 171 10*3/uL (ref 150–400)
RBC: 3.72 MIL/uL — ABNORMAL LOW (ref 3.87–5.11)
RDW: 14.6 % (ref 11.5–15.5)
WBC: 4.9 10*3/uL (ref 4.0–10.5)
nRBC: 0.6 % — ABNORMAL HIGH (ref 0.0–0.2)

## 2022-04-28 LAB — BASIC METABOLIC PANEL
Anion gap: 8 (ref 5–15)
BUN: 14 mg/dL (ref 6–20)
CO2: 24 mmol/L (ref 22–32)
Calcium: 8.1 mg/dL — ABNORMAL LOW (ref 8.9–10.3)
Chloride: 113 mmol/L — ABNORMAL HIGH (ref 98–111)
Creatinine, Ser: 0.95 mg/dL (ref 0.44–1.00)
GFR, Estimated: >60 mL/min (ref >60)
Glucose, Bld: 290 mg/dL — ABNORMAL HIGH (ref 70–99)
Potassium: 4.4 mmol/L (ref 3.5–5.1)
Sodium: 145 mmol/L (ref 135–145)

## 2022-04-28 LAB — GLUCOSE, CAPILLARY
Glucose-Capillary: 266 mg/dL — ABNORMAL HIGH (ref 70–99)
Glucose-Capillary: 303 mg/dL — ABNORMAL HIGH (ref 70–99)
Glucose-Capillary: 213 mg/dL — ABNORMAL HIGH (ref 70–99)
Glucose-Capillary: 170 mg/dL — ABNORMAL HIGH (ref 70–99)

## 2022-04-28 LAB — IRON AND TIBC
Iron: 109 ug/dL (ref 28–170)
Saturation Ratios: 50 % — ABNORMAL HIGH (ref 10.4–31.8)
TIBC: 220 ug/dL — ABNORMAL LOW (ref 250–450)
UIBC: 111 ug/dL

## 2022-04-28 LAB — RETICULOCYTES
Immature Retic Fract: 15.4 % (ref 2.3–15.9)
RBC.: 3.66 MIL/uL — ABNORMAL LOW (ref 3.87–5.11)
Retic Count, Absolute: 76.1 10*3/uL (ref 19.0–186.0)
Retic Ct Pct: 2.1 % (ref 0.4–3.1)

## 2022-04-28 LAB — VITAMIN B12: Vitamin B-12: 552 pg/mL (ref 180–914)

## 2022-04-28 LAB — FERRITIN: Ferritin: 365 ng/mL — ABNORMAL HIGH (ref 11–307)

## 2022-04-28 LAB — FOLATE: Folate: 12.3 ng/mL (ref >5.9)

## 2022-04-28 LAB — HEPARIN LEVEL (UNFRACTIONATED): Heparin Unfractionated: 0.38 IU/mL (ref 0.30–0.70)

## 2022-04-28 MED ORDER — ATORVASTATIN CALCIUM 40 MG PO TABS: 40.0000 mg | ORAL_TABLET | Freq: Every evening | ORAL | 1 refills | Status: DC

## 2022-04-28 MED ORDER — INSULIN GLARGINE 100 UNIT/ML SOLOSTAR PEN: 20.0000 [IU] | PEN_INJECTOR | Freq: Every day | SUBCUTANEOUS | 11 refills | Status: DC

## 2022-04-28 MED ORDER — "SYRINGE 18G X 1-1/2"" 3 ML MISC": 1.0000 | Freq: Three times a day (TID) | 3 refills | Status: AC

## 2022-04-28 MED ORDER — SODIUM CHLORIDE 0.45 % IV SOLN: INTRAVENOUS | Status: DC

## 2022-04-28 MED ORDER — ASPIRIN 81 MG PO TBEC: 81.0000 mg | DELAYED_RELEASE_TABLET | Freq: Every day | ORAL | 12 refills | Status: AC

## 2022-04-28 MED ORDER — INSULIN ASPART 100 UNIT/ML FLEXPEN: 5.0000 [IU] | PEN_INJECTOR | Freq: Three times a day (TID) | SUBCUTANEOUS | 11 refills | Status: DC

## 2022-04-28 NOTE — Consult Note (Signed)
ANTICOAGULATION CONSULT NOTE  Pharmacy Consult for Heparin infusion Indication: chest pain/ACS  No Known Allergies  Patient Measurements: Weight: 115.8 kg (255 lb 4.7 oz) Heparin Dosing Weight: 75.4 kg  Vital Signs: Temp: 98.6 F (37 C) (02/24 0337) Temp Source: Oral (02/23 2010) BP: 130/68 (02/24 0337) Pulse Rate: 85 (02/24 0337)  Labs: Recent Labs    04/26/22 0742 04/26/22 1437 04/26/22 1437 04/26/22 1653 04/26/22 2216 04/27/22 0351 04/27/22 0808 04/27/22 0823 04/27/22 1350 04/28/22 0552  HGB 15.0  --   --   --   --  11.6*  --   --   --  10.7*  HCT 48.0*  --   --   --   --  34.9*  --   --   --  33.2*  PLT 247  --   --   --   --  214  --   --   --  171  APTT  --  23*  --   --   --   --   --   --   --   --   LABPROT  --  15.3*  --   --   --   --   --   --   --   --   INR  --  1.2  --   --   --   --   --   --   --   --   HEPARINUNFRC  --   --    < >  --  0.96*  --   --  0.67 0.49 0.38  CREATININE 2.33* 1.50*  --   --  1.16* 1.22* 1.21*  --   --   --   TROPONINIHS 257* 2,769*  --  2,247*  --   --   --   --   --   --    < > = values in this interval not displayed.     Estimated Creatinine Clearance: 63.2 mL/min (A) (by C-G formula based on SCr of 1.21 mg/dL (H)).   Medical History: Past Medical History:  Diagnosis Date   Breast cancer in situ    ER+; DCIS right breast   Medical history non-contributory    Pre-diabetes    Type 2 diabetes mellitus (HCC)     Medications:  No prior history of chronic AC use.  Assessment: 52 y.o. female with a past medical history of breast cancer status postmastectomy in December currently on radiation treatment for the past 2 weeks who presents to the emergency department for generalized fatigue and weakness.  Patient's troponin elevated to 257, not entirely clear the cause of the troponin elevation. No chest pain. EKG shows no concerning ST findings. Pharmacy has been consulted to initiate heparin as a precaution.   Baseline  labs: aPTT 23, INR 1.2, Hgb 15, Plts 247   Goal of Therapy:  Heparin level 0.3-0.7 units/ml Monitor platelets by anticoagulation protocol: Yes    Plan: heparin level therapeutic x 3 Continue heparin infusion at 800 units/hr Will move to once daily heparin level checks: next 04/29/22 am Monitor CBC daily while on heparin  Renda Rolls, PharmD, [facility] 04/28/2022 6:41 AM

## 2022-04-28 NOTE — Progress Notes (Signed)
[facility]  SUBJECTIVE: Patient sitting in recliner, denies chest pain or shortness of breath   Vitals:   04/27/22 2010 04/27/22 2343 04/28/22 0337 04/28/22 0804  BP: 128/68 (!) 140/66 130/68 110/69  Pulse: 90 88 85 90  Resp: '20 17 16 18  '$ Temp: 98.3 F (36.8 C) 99 F (37.2 C) 98.6 F (37 C) 97.8 F (36.6 C)  TempSrc: Oral     SpO2: 98% 98% 97% 97%  Weight:         Intake/Output Summary (Last 24 hours) at 04/28/2022 1019 Last data filed at 04/28/2022 [id number] Gross per 24 hour  Intake 3802.45 ml  Output 550 ml  Net 3252.45 ml      PHYSICAL EXAM  General: Well developed, well nourished, in no acute distress HEENT:  Normocephalic and atramatic Neck:  No JVD.  Lungs: Clear bilaterally to auscultation and percussion. Heart: HRRR . Normal S1 and S2 without gallops or murmurs.  Abdomen: Bowel sounds are positive, abdomen soft and non-tender  Msk:  Back normal, normal gait. Normal strength and tone for age. Extremities: No clubbing, cyanosis or edema.   Neuro: Alert and oriented X 3. Psych:  Good affect, responds appropriately   LABS: Basic Metabolic Panel: Recent Labs    04/26/22 1437 04/26/22 2216 04/27/22 0808 04/28/22 0552  NA 161*   < > 153* 145  K 4.0   < > 3.9 4.4  CL 117*   < > 119* 113*  CO2 22   < > 23 24  GLUCOSE 336*   < > 162* 290*  BUN 30*   < > 16 14  CREATININE 1.50*   < > 1.21* 0.95  CALCIUM 10.1   < > 8.3* 8.1*  PHOS 3.0  --   --   --    < > = values in this interval not displayed.   Liver Function Tests: Recent Labs    04/26/22 0742  AST 35  ALT 23  ALKPHOS 117  BILITOT 1.9*  PROT 9.3*  ALBUMIN 4.3   No results for input(s): "LIPASE", "AMYLASE" in the last 72 hours. CBC: Recent Labs    04/27/22 0351 04/28/22 0552  WBC 7.9 4.9  HGB 11.6* 10.7*  HCT 34.9* 33.2*  MCV 87.3 89.2  PLT 214 171   Cardiac Enzymes: No results for input(s): "CKTOTAL", "CKMB", "CKMBINDEX", "TROPONINI" in the last 72 hours. BNP: Invalid input(s):  "POCBNP" D-Dimer: No results for input(s): "DDIMER" in the last 72 hours. Hemoglobin A1C: Recent Labs    04/27/22 0351  HGBA1C 12.4*   Fasting Lipid Panel: Recent Labs    04/27/22 0351  CHOL 105  HDL 40*  LDLCALC 48  TRIG 86  CHOLHDL 2.6   Thyroid Function Tests: Recent Labs    04/26/22 0742  TSH 0.836   Anemia Panel: Recent Labs    04/28/22 0552  VITAMINB12 552  FOLATE 12.3  FERRITIN 365*  TIBC 220*  IRON 109  RETICCTPCT 2.1    DG Chest Port 1 View  Result Date: 04/27/2022 CLINICAL DATA:  Ischemia EXAM: PORTABLE CHEST 1 VIEW COMPARISON:  None Available. FINDINGS: The heart size and mediastinal contours are within normal limits. Both lungs are clear. No consolidation, pneumothorax or effusion. No edema. The visualized skeletal structures are unremarkable. Overlapping cardiac leads. Film is slightly rotated to the right. IMPRESSION: No acute cardiopulmonary disease Electronically Signed   By: Jill Side M.D.   On: 04/27/2022 14:28   ECHOCARDIOGRAM COMPLETE  Result Date: 04/27/2022  ECHOCARDIOGRAM REPORT   Patient Name:   Veronica Chandler [facility] Date of Exam: 04/27/2022 Medical Rec #:  [id number]               Height:       60.0 in Accession #:    [id number]              Weight:       255.3 lb Date of Birth:  11-Jul-1970               BSA:          2.071 m Patient Age:    52 years                BP:           131/71 mmHg Patient Gender: F                       HR:           101 bpm. Exam Location:  [facility] Procedure: 2D Echo, Cardiac Doppler and Color Doppler Indications:     Elevated troponin  History:         Patient has no prior history of Echocardiogram examinations.                  Risk Factors:Diabetes. Mediccal history was non-contributory.  Sonographer:     Sherrie Sport Referring Phys:  [id number] [place] Diagnosing Phys: Neoma Laming  Sonographer Comments: Technically challenging study due to limited acoustic windows, Technically difficult study due to poor echo  windows, no apical window and no subcostal window. IMPRESSIONS  1. Left ventricular ejection fraction, by estimation, is 55 to 60%. The left ventricle has normal function. The left ventricle has no regional wall motion abnormalities. There is mild concentric left ventricular hypertrophy. Left ventricular diastolic function could not be evaluated.  2. Right ventricular systolic function is normal. The right ventricular size is normal.  3. The mitral valve is normal in structure. Trivial mitral valve regurgitation. No evidence of mitral stenosis.  4. The aortic valve is normal in structure. Aortic valve regurgitation is not visualized. No aortic stenosis is present.  5. The inferior vena cava is normal in size with greater than 50% respiratory variability, suggesting right atrial pressure of 3 mmHg. FINDINGS  Left Ventricle: Left ventricular ejection fraction, by estimation, is 55 to 60%. The left ventricle has normal function. The left ventricle has no regional wall motion abnormalities. The left ventricular internal cavity size was normal in size. There is  mild concentric left ventricular hypertrophy. Left ventricular diastolic function could not be evaluated. Right Ventricle: The right ventricular size is normal. No increase in right ventricular wall thickness. Right ventricular systolic function is normal. Left Atrium: Left atrial size was normal in size. Right Atrium: Right atrial size was normal in size. Pericardium: There is no evidence of pericardial effusion. Mitral Valve: The mitral valve is normal in structure. Trivial mitral valve regurgitation. No evidence of mitral valve stenosis. Tricuspid Valve: The tricuspid valve is normal in structure. Tricuspid valve regurgitation is trivial. No evidence of tricuspid stenosis. Aortic Valve: The aortic valve is normal in structure. Aortic valve regurgitation is not visualized. No aortic stenosis is present. Pulmonic Valve: The pulmonic valve was normal in  structure. Pulmonic valve regurgitation is not visualized. No evidence of pulmonic stenosis. Aorta: The aortic root is normal in size and structure. Venous: The inferior vena cava is normal in size with greater  than 50% respiratory variability, suggesting right atrial pressure of 3 mmHg. IAS/Shunts: No atrial level shunt detected by color flow Doppler.  LEFT VENTRICLE PLAX 2D LVIDd:         2.80 cm LVIDs:         2.10 cm LV PW:         0.90 cm LV IVS:        0.90 cm LVOT diam:     2.00 cm LVOT Area:     3.14 cm  LEFT ATRIUM         Index LA diam:    3.50 cm 1.69 cm/m   AORTA Ao Root diam: 1.50 cm  SHUNTS Systemic Diam: 2.00 cm Neoma Laming Electronically signed by Neoma Laming Signature Date/Time: 04/27/2022/11:58:36 AM    Final      Echo EF 55-60% 04/27/2022  TELEMETRY: Sinus rhythm 88 bpm:  ASSESSMENT AND PLAN:  Principal Problem:   DKA (diabetic ketoacidosis) ([place]) Active Problems:   Morbid obesity ([place])   Breast cancer in situ   Diabetes mellitus without complication ([place])   Myocardial injury   Hypernatremia   AKI (acute kidney injury) ([place])   Dehydration   Hypercalcemia   Leukocytosis   Bacterial vaginosis    1.  Elevated troponin (257, 2769, 2247), in the absence of chest pain or shortness of breath, without ECG changes, in the setting of diabetic ketoacidosis, suspect demand supply ischemia, 2D echo 04/27/2022 revealed normal left ventricular function, without regional wall motion abnormalities 2.  DKA treated with Insulin drip transitioned to [place] insulin 3.  Acute kidney injury, improving  Recommendations  1.  Agree with current therapy 2.  Continue atorvastatin 3.  Defer cardiac catheterization at this time   Isaias Cowman, MD, PhD, [facility] 04/28/2022 10:19 AM

## 2022-04-28 NOTE — Evaluation (Signed)
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 She was admitted with DKA, hypernatremia, AKI, Demand ischemia.  Clinical Impression  The pt is presenting this session with c/o of dizziness and light headedness during mobility. She is highly limited in independence and distance with gait d/t to the above concerns. The pt is demonstrating an increased risk of falls d/t balance concerns. Prior to admission pt reports ambulating with independence at this time she requires unilateral UE support and supervision for safety. MD notified of dizziness and BP concerns. AT this time recommend home with assistive device and HHPT. PT will continue to follow.        Recommendations for follow up therapy are one component of a multi-disciplinary discharge planning process, led by the attending physician.  Recommendations may be updated based on patient status, additional functional criteria and insurance authorization.  Follow Up Recommendations Home health PT      Assistance Recommended at Discharge Set up Supervision/Assistance  Patient can return home with the following  A little help with walking and/or transfers;A little help with bathing/dressing/bathroom;Help with stairs or ramp for entrance    Equipment Recommendations Rolling walker (2 wheels)  Recommendations for Other Services       Functional Status Assessment Patient has had a recent decline in their functional status and demonstrates the ability to make significant improvements in function in a reasonable and predictable amount of time.     Precautions / Restrictions Precautions Precautions: Fall Restrictions Weight Bearing Restrictions: No      Mobility  Bed  Mobility Overal bed mobility: Needs Assistance Bed Mobility: Sit to Supine       Sit to supine: Min assist        Transfers Overall transfer level: Needs assistance Equipment used: None Transfers: Sit to/from Stand Sit to Stand: Min guard                Ambulation/Gait Ambulation/Gait assistance: Min Web designer (Feet): 50 Feet Assistive device: IV Pole Gait Pattern/deviations: Shuffle, Narrow base of support   Gait velocity interpretation: <1.31 ft/sec, indicative of household Social research officer, government Rankin (Stroke Patients Only)       Balance Overall balance assessment: Needs assistance   Sitting balance-Leahy Scale: Good     Standing balance support: During functional activity, Single extremity supported Standing balance-Leahy Scale: Fair                               Pertinent Vitals/Pain Pain Assessment Pain Assessment: No/denies pain    Home Living Family/patient expects to be discharged to:: Private residence Living Arrangements: Children Available Help at Discharge: Family;Friend(s);Available PRN/intermittently Type of Home: House Home Access: Stairs to enter Entrance Stairs-Rails: Can reach both Entrance Stairs-Number of Steps: 4   Home Layout: One level Home Equipment: None      Prior Function Prior Level of Function : Independent/Modified Independent             Mobility Comments: Pt reports independence with all mobility without the use of an AD ADLs Comments: Pt reports independence with all ADL activities     Hand Dominance  Dominant Hand: Right    Extremity/Trunk Assessment   Upper Extremity Assessment Upper Extremity Assessment: Overall WFL for tasks assessed    Lower Extremity Assessment Lower Extremity Assessment: RLE deficits/detail;LLE deficits/detail RLE Sensation:  (Pt reporting tingling of LE) LLE Sensation:  (Pt reporting tingling  of LE)    Cervical / Trunk Assessment Cervical / Trunk Assessment: Normal  Communication   Communication: No difficulties  Cognition Arousal/Alertness: Awake/alert Behavior During Therapy: WFL for tasks assessed/performed Overall Cognitive Status: Within Functional Limits for tasks assessed                                          General Comments      Exercises     Assessment/Plan    PT Assessment Patient needs continued PT services  PT Problem List Decreased activity tolerance;Decreased mobility;Impaired sensation       PT Treatment Interventions Gait training;Therapeutic activities;Stair training;Functional mobility training;Therapeutic exercise;Balance training    PT Goals (Current goals can be found in the Care Plan section)  Acute Rehab PT Goals Patient Stated Goal: Return home PT Goal Formulation: With patient Time For Goal Achievement: 05/12/22 Potential to Achieve Goals: Good    Frequency Min 2X/week     Co-evaluation               AM-PAC PT "6 Clicks" Mobility  Outcome Measure Help needed turning from your back to your side while in a flat bed without using bedrails?: A Little Help needed moving from lying on your back to sitting on the side of a flat bed without using bedrails?: A Little Help needed moving to and from a bed to a chair (including a wheelchair)?: A Little Help needed standing up from a chair using your arms (e.g., wheelchair or bedside chair)?: A Little Help needed to walk in hospital room?: A Little Help needed climbing 3-5 steps with a railing? : A Little 6 Click Score: 18    End of Session   Activity Tolerance: Patient limited by fatigue Patient left: in bed;with nursing/sitter in room Nurse Communication: Mobility status PT Visit Diagnosis: Unsteadiness on feet (R26.81);Dizziness and giddiness (R42)    Time: [id number] PT Time Calculation (min) (ACUTE ONLY): 30 min   Charges:   PT Evaluation $PT Eval  Moderate Complexity: 1 Mod PT Treatments $Gait Training: 8-22 mins        11:06 AM, 04/28/22 Unity Luepke A. Saverio Danker PT, DPT Physical Therapist - [facility] A [place] 04/28/2022, 11:04 AM

## 2022-04-28 NOTE — Progress Notes (Signed)
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  She reports poor appetite and decreased oral intake.  Patient was diagnosed with bacterial vaginosis and is started on Flagyl on 12/22.  Vaginal discharge has resolved.  She is undergoing radiation for breast cancer.  She was admitted with DKA, hypernatremia, AKI, Demand ischemia     Assessment & Plan:   Principal Problem:   DKA (diabetic ketoacidosis) ([place]) Active Problems:   Diabetes mellitus without complication ([place])   Myocardial injury   Breast cancer in situ   Hypernatremia   AKI (acute kidney injury) ([place])   Dehydration   Hypercalcemia   Leukocytosis   Bacterial vaginosis   Morbid obesity ([place])   1-DKA, diabetes type 2: -Presents with hyperglycemia, CBG 869, bicarb 16, anion gap 32. -She was treated with insulin drip and IV fluids -She has been transitioned to Levemir, 2 units meal coverage and a sliding scale insulin -She will likely need to be discharged on insulin due to new diagnosis of DKA -A1c 12. She will need to be discharge on long acting and Meal coverage.   Demand Ischemia;  EKG sinus tachycardia, PVC.  Cardiology consulted and following.  ECHO : Ef 55 %No wall motion abnormalities.  Further evaluation per cardiology out patient.  She will complete 48 hours of heparin Gtt 2/24 Chest x ray; No acute pulmonary diseases.   Orthostatic Hypotension;  She report dizziness while walking with PT>  Orthostatic vital positive.  Plan to resume IV fluids for another 24 hours.  Reassess 2/25.  Hypernatremia: Continue with half NS\ Encourage water intake.  Sodium normalized.   Hypercalcemia:  In the setting of dehydration. Resolved.  PTH: 35  Normal , phosphorus 3.0 and vitamin D 37 normal  PTH related peptide pending.   AKI; cr peak to 1.6 In setting hypovolemia;  Resolved with IV fluids.   Anemia: B 12: [id number], Folic acid 12, Iron [id number], ferritin 365 Anemia of chronic diseases.   Leukocytosis: In the setting of DKA Resolved UA negative, chest x ray negative.   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   Weight as of this encounter: 115.8 kg.   DVT prophylaxis: Heparin gtt Code Status: Full code Family Communication: Care discussed with patient.  Disposition Plan:  Status is: Inpatient Remains inpatient appropriate because: remain in patient ofr fluids, orthostatic hypotension.    Consultants:  Cardiology   Procedures:  ECHO  Antimicrobials:    Subjective: She is feeling better.  We discussed results A1c at 12.  She was dizzy while working with PT>    Objective: Vitals:   04/28/22 0804 04/28/22 1025 04/28/22 1159 04/28/22 1528  BP: 110/69 101/65 (!) 125/44 (!) 107/52  Pulse: 90 97 89 98  Resp: '18  18 20  '$ Temp: 97.8 F (36.6 C)  98.2 F (36.8 C) 98.4 F (36.9 C)  TempSrc:      SpO2: 97% 99% 99% 100%  Weight:        Intake/Output  Summary (Last 24 hours) at 04/28/2022 1601 Last data filed at 04/28/2022 1300 Gross per 24 hour  Intake 3671.61 ml  Output 300 ml  Net 3371.61 ml    Filed Weights   04/26/22 1258  Weight: 115.8 kg    Examination:  General exam: NAD Respiratory system: CTA Cardiovascular system: S 1, S 2 RRR Gastrointestinal system: BS present, soft, nt Central nervous system: Alert Extremities: no edema   Data Reviewed: I have personally  reviewed following labs and imaging studies  CBC: Recent Labs  Lab 04/26/22 0742 04/27/22 0351 04/28/22 0552  WBC 13.6* 7.9 4.9  HGB 15.0 11.6* 10.7*  HCT 48.0* 34.9* 33.2*  MCV 91.3 87.3 89.2  PLT 247 214 [id number]    Basic Metabolic Panel: Recent Labs  Lab 04/26/22 1437 04/26/22 2216 04/27/22 0351 04/27/22 0808 04/28/22 0552  NA 161* 158* 155* 153* 145  K 4.0 3.4* 3.5 3.9 4.4  CL 117* 122* 120* 119* 113*  CO2 '22 26 28 23 24  '$ GLUCOSE 336* 199* 169* 162* 290*  BUN 30* 22* '18 16 14  '$ CREATININE 1.50* 1.16* 1.22* 1.21* 0.95  CALCIUM 10.1 9.2 8.9 8.3* 8.1*  PHOS 3.0  --   --   --   --     GFR: Estimated Creatinine Clearance: 80.5 mL/min (by C-G formula based on SCr of 0.95 mg/dL). Liver Function Tests: Recent Labs  Lab 04/26/22 0742  AST 35  ALT 23  ALKPHOS 117  BILITOT 1.9*  PROT 9.3*  ALBUMIN 4.3    No results for input(s): "LIPASE", "AMYLASE" in the last 168 hours. No results for input(s): "AMMONIA" in the last 168 hours. Coagulation Profile: Recent Labs  Lab 04/26/22 1437  INR 1.2    Cardiac Enzymes: No results for input(s): "CKTOTAL", "CKMB", "CKMBINDEX", "TROPONINI" in the last 168 hours. BNP (last 3 results) No results for input(s): "PROBNP" in the last 8760 hours. HbA1C: Recent Labs    04/26/22 1203 04/27/22 0351  HGBA1C 12.1* 12.4*    CBG: Recent Labs  Lab 04/27/22 1629 04/27/22 2036 04/28/22 0801 04/28/22 1154 04/28/22 1524  GLUCAP 197* 201* 266* 303* 213*    Lipid Profile: Recent Labs    04/27/22 0351  CHOL 105  HDL 40*  LDLCALC 48  TRIG 86  CHOLHDL 2.6    Thyroid Function Tests: Recent Labs    04/26/22 0742  TSH 0.836    Anemia Panel: Recent Labs    04/28/22 0552  VITAMINB12 552  FOLATE 12.3  FERRITIN 365*  TIBC 220*  IRON 109  RETICCTPCT 2.1   Sepsis Labs: No results for input(s): "PROCALCITON", "LATICACIDVEN" in the last 168 hours.  Recent Results (from the past 240 hour(s))  Resp panel by RT-PCR  (RSV, Flu A&B, Covid) Anterior Nasal Swab     Status: None   Collection Time: 04/26/22  7:42 AM   Specimen: Anterior Nasal Swab  Result Value Ref Range Status   SARS Coronavirus 2 by RT PCR NEGATIVE NEGATIVE Final    Comment: (NOTE) SARS-CoV-2 target nucleic acids are NOT DETECTED.  The SARS-CoV-2 RNA is generally detectable in upper respiratory specimens during the acute phase of infection. The lowest concentration of SARS-CoV-2 viral copies this assay can detect is 138 copies/mL. A negative result does not preclude SARS-Cov-2 infection and should not be used as the sole basis for treatment or other patient management decisions. A negative result may occur with  improper specimen collection/handling, submission of specimen other than nasopharyngeal swab, presence of viral mutation(s)  within the areas targeted by this assay, and inadequate number of viral copies(<138 copies/mL). A negative result must be combined with clinical observations, patient history, and epidemiological information. The expected result is Negative.  Fact Sheet for Patients:  EntrepreneurPulse.com.au  Fact Sheet for Healthcare Providers:  IncredibleEmployment.be  This test is no t yet approved or cleared by the [place] FDA and  has been authorized for detection and/or diagnosis of SARS-CoV-2 by FDA under an Emergency Use Authorization (EUA). This EUA will remain  in effect (meaning this test can be used) for the duration of the COVID-19 declaration under Section 564(b)(1) of the Act, 21 U.S.C.section 360bbb-3(b)(1), unless the authorization is terminated  or revoked sooner.       Influenza A by PCR NEGATIVE NEGATIVE Final   Influenza B by PCR NEGATIVE NEGATIVE Final    Comment: (NOTE) The Xpert Xpress SARS-CoV-2/FLU/RSV plus assay is intended as an aid in the diagnosis of influenza from Nasopharyngeal swab specimens and should not be used as a sole basis for  treatment. Nasal washings and aspirates are unacceptable for Xpert Xpress SARS-CoV-2/FLU/RSV testing.  Fact Sheet for Patients: EntrepreneurPulse.com.au  Fact Sheet for Healthcare Providers: IncredibleEmployment.be  This test is not yet approved or cleared by the [place] FDA and has been authorized for detection and/or diagnosis of SARS-CoV-2 by FDA under an Emergency Use Authorization (EUA). This EUA will remain in effect (meaning this test can be used) for the duration of the COVID-19 declaration under Section 564(b)(1) of the Act, 21 U.S.C. section 360bbb-3(b)(1), unless the authorization is terminated or revoked.     Resp Syncytial Virus by PCR NEGATIVE NEGATIVE Final    Comment: (NOTE) Fact Sheet for Patients: EntrepreneurPulse.com.au  Fact Sheet for Healthcare Providers: IncredibleEmployment.be  This test is not yet approved or cleared by the [place] FDA and has been authorized for detection and/or diagnosis of SARS-CoV-2 by FDA under an Emergency Use Authorization (EUA). This EUA will remain in effect (meaning this test can be used) for the duration of the COVID-19 declaration under Section 564(b)(1) of the Act, 21 U.S.C. section 360bbb-3(b)(1), unless the authorization is terminated or revoked.  Performed at [facility], [address] [place], [place] [address]   MRSA Next Gen by PCR, Nasal     Status: None   Collection Time: 04/26/22  1:04 PM   Specimen: Nasal Mucosa; Nasal Swab  Result Value Ref Range Status   MRSA by PCR Next Gen NOT DETECTED NOT DETECTED Final    Comment: (NOTE) The GeneXpert MRSA Assay (FDA approved for NASAL specimens only), is one component of a comprehensive MRSA colonization surveillance program. It is not intended to diagnose MRSA infection nor to guide or monitor treatment for MRSA infections. Test performance is not FDA approved in  patients less than 63 years old. Performed at [facility], [address], [place], [place] [address]          Radiology Studies: [facility] Chest [place] 1 View  Result Date: 04/27/2022 CLINICAL DATA:  Ischemia EXAM: PORTABLE CHEST 1 VIEW COMPARISON:  None Available. FINDINGS: The heart size and mediastinal contours are within normal limits. Both lungs are clear. No consolidation, pneumothorax or effusion. No edema. The visualized skeletal structures are unremarkable. Overlapping cardiac leads. Film is slightly rotated to the right. IMPRESSION: No acute cardiopulmonary disease Electronically Signed   By: Jill Side M.D.   On: 04/27/2022 14:28   ECHOCARDIOGRAM COMPLETE  Result Date: 04/27/2022    ECHOCARDIOGRAM REPORT  Patient Name:   AMIRAH OUTWATER [facility] Date of Exam: 04/27/2022 Medical Rec #:  [id number]               Height:       60.0 in Accession #:    [id number]              Weight:       255.3 lb Date of Birth:  1970/09/22               BSA:          2.071 m Patient Age:    44 years                BP:           131/71 mmHg Patient Gender: F                       HR:           101 bpm. Exam Location:  [facility] Procedure: 2D Echo, Cardiac Doppler and Color Doppler Indications:     Elevated troponin  History:         Patient has no prior history of Echocardiogram examinations.                  Risk Factors:Diabetes. Mediccal history was non-contributory.  Sonographer:     Sherrie Sport Referring Phys:  [id number] Pe Ell Diagnosing Phys: Neoma Laming  Sonographer Comments: Technically challenging study due to limited acoustic windows, Technically difficult study due to poor echo windows, no apical window and no subcostal window. IMPRESSIONS  1. Left ventricular ejection fraction, by estimation, is 55 to 60%. The left ventricle has normal function. The left ventricle has no regional wall motion abnormalities. There is mild concentric left ventricular hypertrophy. Left ventricular diastolic  function could not be evaluated.  2. Right ventricular systolic function is normal. The right ventricular size is normal.  3. The mitral valve is normal in structure. Trivial mitral valve regurgitation. No evidence of mitral stenosis.  4. The aortic valve is normal in structure. Aortic valve regurgitation is not visualized. No aortic stenosis is present.  5. The inferior vena cava is normal in size with greater than 50% respiratory variability, suggesting right atrial pressure of 3 mmHg. FINDINGS  Left Ventricle: Left ventricular ejection fraction, by estimation, is 55 to 60%. The left ventricle has normal function. The left ventricle has no regional wall motion abnormalities. The left ventricular internal cavity size was normal in size. There is  mild concentric left ventricular hypertrophy. Left ventricular diastolic function could not be evaluated. Right Ventricle: The right ventricular size is normal. No increase in right ventricular wall thickness. Right ventricular systolic function is normal. Left Atrium: Left atrial size was normal in size. Right Atrium: Right atrial size was normal in size. Pericardium: There is no evidence of pericardial effusion. Mitral Valve: The mitral valve is normal in structure. Trivial mitral valve regurgitation. No evidence of mitral valve stenosis. Tricuspid Valve: The tricuspid valve is normal in structure. Tricuspid valve regurgitation is trivial. No evidence of tricuspid stenosis. Aortic Valve: The aortic valve is normal in structure. Aortic valve regurgitation is not visualized. No aortic stenosis is present. Pulmonic Valve: The pulmonic valve was normal in structure. Pulmonic valve regurgitation is not visualized. No evidence of pulmonic stenosis. Aorta: The aortic root is normal in size and structure. Venous: The inferior vena cava is normal in size with greater than 50% respiratory variability, suggesting  right atrial pressure of 3 mmHg. IAS/Shunts: No atrial level shunt  detected by color flow Doppler.  LEFT VENTRICLE PLAX 2D LVIDd:         2.80 cm LVIDs:         2.10 cm LV PW:         0.90 cm LV IVS:        0.90 cm LVOT diam:     2.00 cm LVOT Area:     3.14 cm  LEFT ATRIUM         Index LA diam:    3.50 cm 1.69 cm/m   AORTA Ao Root diam: 1.50 cm  SHUNTS Systemic Diam: 2.00 cm Neoma Laming Electronically signed by Neoma Laming Signature Date/Time: 04/27/2022/11:58:36 AM    Final         Scheduled Meds:  aspirin  324 mg Oral Once   aspirin EC  81 mg Oral Daily   atorvastatin  40 mg Oral QPM   Chlorhexidine Gluconate Cloth  6 each Topical Daily   feeding supplement (GLUCERNA SHAKE)  237 mL Oral BID BM   fluticasone  1 spray Each Nare Daily   insulin aspart  0-20 Units Subcutaneous TID WC   insulin aspart  3 Units Subcutaneous TID WC   insulin glargine-yfgn  20 Units Subcutaneous Daily   loratadine  5 mg Oral Daily   metroNIDAZOLE  500 mg Oral BID   multivitamin with minerals  1 tablet Oral Daily   Ensure Max Protein  11 oz Oral QHS   Continuous Infusions:  sodium chloride 75 mL/hr at 04/28/22 1141   sodium chloride Stopped (04/27/22 0559)     LOS: 2 days    Time spent: 35 minutes    Janiece Scovill A Shantavia Jha, MD [facility]   If 7PM-7AM, please contact night-coverage [email or web address]  04/28/2022, 4:01 PM

## 2022-04-29 DIAGNOSIS — E1010 Type 1 diabetes mellitus with ketoacidosis without coma: Secondary | ICD-10-CM

## 2022-04-29 LAB — BASIC METABOLIC PANEL
Anion gap: 5 (ref 5–15)
BUN: 11 mg/dL (ref 6–20)
CO2: 25 mmol/L (ref 22–32)
Calcium: 8 mg/dL — ABNORMAL LOW (ref 8.9–10.3)
Chloride: 110 mmol/L (ref 98–111)
Creatinine, Ser: 0.77 mg/dL (ref 0.44–1.00)
GFR, Estimated: >60 mL/min (ref >60)
Glucose, Bld: 224 mg/dL — ABNORMAL HIGH (ref 70–99)
Potassium: 4 mmol/L (ref 3.5–5.1)
Sodium: 140 mmol/L (ref 135–145)

## 2022-04-29 LAB — CBC
HCT: 32.5 % — ABNORMAL LOW (ref 36.0–46.0)
Hemoglobin: 10.8 g/dL — ABNORMAL LOW (ref 12.0–15.0)
MCH: 29.1 pg (ref 26.0–34.0)
MCHC: 33.2 g/dL (ref 30.0–36.0)
MCV: 87.6 fL (ref 80.0–100.0)
Platelets: 153 10*3/uL (ref 150–400)
RBC: 3.71 MIL/uL — ABNORMAL LOW (ref 3.87–5.11)
RDW: 14.4 % (ref 11.5–15.5)
WBC: 4.2 10*3/uL (ref 4.0–10.5)
nRBC: 1 % — ABNORMAL HIGH (ref 0.0–0.2)

## 2022-04-29 LAB — GLUCOSE, CAPILLARY
Glucose-Capillary: 227 mg/dL — ABNORMAL HIGH (ref 70–99)
Glucose-Capillary: 217 mg/dL — ABNORMAL HIGH (ref 70–99)

## 2022-04-29 LAB — PTH-RELATED PEPTIDE: PTH-related peptide: <2 pmol/L

## 2022-04-29 NOTE — Discharge Summary (Signed)
[facility] Discharge Summary   Patient: Veronica Chandler [id number]  PCP: Jearld Fenton, NP  Date of admission: 04/26/2022   Date of discharge: 04/29/2022     Discharge Diagnoses:  Principal Problem:   DKA (diabetic ketoacidosis) ([place]) Active Problems:   Diabetes mellitus without complication ([place])   Myocardial injury   Breast cancer in situ   Hypernatremia   AKI (acute kidney injury) ([place])   Dehydration   Hypercalcemia   Leukocytosis   Bacterial vaginosis   Morbid obesity ([place])   Admitted From: Home Disposition:  Home   Recommendations for Outpatient Follow-up:  Follow-up with PCP in 1 week, continue to monitor BP at home and follow with PCP if persistent low blood pressure then patient may need midodrine currently patient is asymptomatic. Continue to monitor fingerstick blood glucose and use insulin.  Further management as an outpatient with PCP, patient may benefit from referral to endocrinologist. Follow-up with cardiology in 1 to 2 weeks. Follow up LABS/TEST:     Follow-up Information     Jearld Fenton, NP Follow up in 1 week(s).   Specialties: Internal Medicine, Emergency Medicine Contact information: [address] [place] [id number] [phone]                Diet recommendation: Carb modified diet  Activity: The patient is advised to gradually reintroduce usual activities, as tolerated  Discharge Condition: stable  Code Status: Full code   History of present illness: As per the H and P dictated on admission. 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  She reports poor appetite and decreased oral intake.  Patient was diagnosed with bacterial vaginosis and is started on Flagyl on 12/22.  Vaginal discharge has resolved.  She is undergoing radiation for breast cancer.    She was admitted with DKA, hypernatremia, AKI, Demand ischemia  Assessment & Plan: # DKA, diabetes type 2: Presented with hyperglycemia, CBG 869, bicarb 16, anion gap 32. S/p insulin drip and IV fluids. She was transitioned to Levemir, 2 units meal coverage and a sliding scale insulin.  HbA1c 12, uncontrolled diabetes, patient was not aware that she With diabetes.  As per patient she was prediabetic and never checked her blood sugar and did not do any diet control.  Patient was advised to monitor CBG, continue diabetic diet and discharged on Lantus and NovoLog.  Follow-up with PCP and endocrinologist for further management as an outpatient. # Demand Ischemia; EKG sinus tachycardia, PVC. ECHO : Ef 55 %No wall motion abnormalities. s/p 48 hours of heparin Gtt 2/24. Chest x ray; No acute pulmonary diseases.  Patient was discharged on aspirin and statin.  Patient was seen by cardiology, deferred cardiac cath at this time due to absence of chest pain, recommended to follow with Dr. Humphrey Rolls in 1 to 2 weeks as an outpatient. # Orthostatic Hypotension; She reported dizziness while walking with PT>  Orthostatic vital positive. S/p V fluids for another 24 hours.  Today patient was orthostatic but without any symptoms.  Patient ambulated well, denies any dizziness.  Patient wanted to go home so patient was discharged and advised to move slowly due to orthostatic hypotension.  And follow with PCP. # Hypernatremia: s/p 0.45 saline and encouraged for oral fluid intake.  Sodium level normalized.  Hypernatremia resolved. # Hypercalcemia: In the setting of dehydration. Resolved. PTH: 35 Normal , phosphorus 3.0 and  vitamin D 37 normal. PTH related peptide < 2 wnl # AKI; cr peak to 1.6 In setting hypovolemia; Resolved with IV fluids.  # Anemia: B 12: [id number], Folic acid 12, Iron [id number], ferritin 365. Anemia of chronic diseases.  Hb 10.7, stable, follow with PCP as an outpatient. # Leukocytosis: Reactive leukocytosis in the setting  of DKA. Resolved UA negative, chest x ray negative.  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  Nutrition Problem: Increased nutrient needs Etiology: cancer and cancer related treatments Nutrition Interventions: Calorie restricted diet, daily exercise and lifestyle modification was advised. Interventions: Premier Protein, MVI, Education, Glucerna shake   Patient was ambulatory without any assistance. On the day of the discharge the patient's vitals were stable, and no other acute medical condition were reported by patient. the patient was felt safe to be discharge at Home.  Consultants: Cardiologist Procedures: None  Discharge Exam: General: Appear in no distress, no Rash; Oral Mucosa Clear, moist. Cardiovascular: S1 and S2 Present, no Murmur, Respiratory: normal respiratory effort, Bilateral Air entry present and no Crackles, no wheezes Abdomen: Bowel Sound present, Soft and no tenderness, no hernia Extremities: no Pedal edema, no calf tenderness Neurology: alert and oriented to time, place, and person affect appropriate.  Filed Weights   04/26/22 1258  Weight: 115.8 kg   Vitals:   04/29/22 1105 04/29/22 1143  BP:  (!) 86/49  Pulse:  92  Resp:  16  Temp:  98.3 F (36.8 C)  SpO2: 99% 99%    DISCHARGE MEDICATION: Allergies as of 04/29/2022   No Known Allergies      Medication List     STOP taking these medications    ibuprofen 800 MG tablet Commonly known as: ADVIL   sucralfate 1 g tablet Commonly known as: Carafate       TAKE these medications    aspirin EC 81 MG tablet Take 1 tablet (81 mg total) by mouth daily. Swallow whole.   atorvastatin 40 MG tablet Commonly known as: LIPITOR Take 1 tablet (40 mg total) by mouth every evening.    fluticasone 50 MCG/ACT nasal spray Commonly known as: FLONASE SPRAY 2 SPRAYS INTO EACH NOSTRIL EVERY DAY   HYDROcodone-acetaminophen 5-325 MG tablet Commonly known as: Norco Take 1 tablet by mouth every 6 (six) hours as needed for up to 6 doses for moderate pain.   insulin aspart 100 UNIT/ML FlexPen Commonly known as: NOVOLOG Inject 5 Units into the skin 3 (three) times daily with meals.   insulin glargine 100 UNIT/ML Solostar Pen Commonly known as: LANTUS Inject 20 Units into the skin daily.   levocetirizine 5 MG tablet Commonly known as: XYZAL TAKE 1 TABLET BY MOUTH EVERY DAY IN THE EVENING What changed:  how much to take how to take this when to take this additional instructions   lidocaine-prilocaine cream Commonly known as: EMLA Apply 1 Application topically once.   metroNIDAZOLE 500 MG tablet Commonly known as: FLAGYL Take 1 tablet (500 mg total) by mouth 2 (two) times daily.   multivitamin with minerals Tabs tablet Take 1 tablet by mouth daily.   SYRINGE 3CC/18GX1-1/2" 18G X 1-1/2" 3 ML Misc 1 Application by Does not apply route 3 (three) times daily.       No Known Allergies Discharge Instructions     Ambulatory referral  to Nutrition and Diabetic Education   Complete by: As directed    New diagnosis Diabetes.  A1c= 12.1%.  Will need insulin for home.  PCP: [name], NP   Call MD for:  difficulty breathing, headache or visual disturbances   Complete by: As directed    Call MD for:  extreme fatigue   Complete by: As directed    Call MD for:  persistant dizziness or light-headedness   Complete by: As directed    Call MD for:  persistant nausea and vomiting   Complete by: As directed    Call MD for:  severe uncontrolled pain   Complete by: As directed    Call MD for:  temperature >100.4   Complete by: As directed    Diet Carb Modified   Complete by: As directed    Discharge instructions   Complete by: As directed    Follow-up with PCP in 1  week, continue to monitor BP at home and follow with PCP if persistent low blood pressure then patient may need midodrine currently patient is asymptomatic. Continue to monitor fingerstick blood glucose and use insulin.  Further management as an outpatient with PCP, patient may benefit from referral to endocrinologist. Follow-up with cardiology in 1 to 2 weeks.   Increase activity slowly   Complete by: As directed    Increase activity slowly   Complete by: As directed        The results of significant diagnostics from this hospitalization (including imaging, microbiology, ancillary and laboratory) are listed below for reference.    Significant Diagnostic Studies: DG Chest Port 1 View  Result Date: 04/27/2022 CLINICAL DATA:  Ischemia EXAM: PORTABLE CHEST 1 VIEW COMPARISON:  None Available. FINDINGS: The heart size and mediastinal contours are within normal limits. Both lungs are clear. No consolidation, pneumothorax or effusion. No edema. The visualized skeletal structures are unremarkable. Overlapping cardiac leads. Film is slightly rotated to the right. IMPRESSION: No acute cardiopulmonary disease Electronically Signed   By: Jill Side M.D.   On: 04/27/2022 14:28   ECHOCARDIOGRAM COMPLETE  Result Date: 04/27/2022    ECHOCARDIOGRAM REPORT   Patient Name:   YASIRAH KURAMOTO [facility] Date of Exam: 04/27/2022 Medical Rec #:  [id number]               Height:       60.0 in Accession #:    [id number]              Weight:       255.3 lb Date of Birth:  Jul 30, 1970               BSA:          2.071 m Patient Age:    47 years                BP:           131/71 mmHg Patient Gender: F                       HR:           101 bpm. Exam Location:  [facility] Procedure: 2D Echo, Cardiac Doppler and Color Doppler Indications:     Elevated troponin  History:         Patient has no prior history of Echocardiogram examinations.                  Risk Factors:Diabetes. Mediccal history was non-contributory.  Sonographer:  Sherrie Sport Referring Phys:  [id number] [place] Diagnosing Phys: Neoma Laming  Sonographer Comments: Technically challenging study due to limited acoustic windows, Technically difficult study due to poor echo windows, no apical window and no subcostal window. IMPRESSIONS  1. Left ventricular ejection fraction, by estimation, is 55 to 60%. The left ventricle has normal function. The left ventricle has no regional wall motion abnormalities. There is mild concentric left ventricular hypertrophy. Left ventricular diastolic function could not be evaluated.  2. Right ventricular systolic function is normal. The right ventricular size is normal.  3. The mitral valve is normal in structure. Trivial mitral valve regurgitation. No evidence of mitral stenosis.  4. The aortic valve is normal in structure. Aortic valve regurgitation is not visualized. No aortic stenosis is present.  5. The inferior vena cava is normal in size with greater than 50% respiratory variability, suggesting right atrial pressure of 3 mmHg. FINDINGS  Left Ventricle: Left ventricular ejection fraction, by estimation, is 55 to 60%. The left ventricle has normal function. The left ventricle has no regional wall motion abnormalities. The left ventricular internal cavity size was normal in size. There is  mild concentric left ventricular hypertrophy. Left ventricular diastolic function could not be evaluated. Right Ventricle: The right ventricular size is normal. No increase in right ventricular wall thickness. Right ventricular systolic function is normal. Left Atrium: Left atrial size was normal in size. Right Atrium: Right atrial size was normal in size. Pericardium: There is no evidence of pericardial effusion. Mitral Valve: The mitral valve is normal in structure. Trivial mitral valve regurgitation. No evidence of mitral valve stenosis. Tricuspid Valve: The tricuspid valve is normal in structure. Tricuspid valve regurgitation is trivial. No evidence  of tricuspid stenosis. Aortic Valve: The aortic valve is normal in structure. Aortic valve regurgitation is not visualized. No aortic stenosis is present. Pulmonic Valve: The pulmonic valve was normal in structure. Pulmonic valve regurgitation is not visualized. No evidence of pulmonic stenosis. Aorta: The aortic root is normal in size and structure. Venous: The inferior vena cava is normal in size with greater than 50% respiratory variability, suggesting right atrial pressure of 3 mmHg. IAS/Shunts: No atrial level shunt detected by color flow Doppler.  LEFT VENTRICLE PLAX 2D LVIDd:         2.80 cm LVIDs:         2.10 cm LV PW:         0.90 cm LV IVS:        0.90 cm LVOT diam:     2.00 cm LVOT Area:     3.14 cm  LEFT ATRIUM         Index LA diam:    3.50 cm 1.69 cm/m   AORTA Ao Root diam: 1.50 cm  SHUNTS Systemic Diam: 2.00 cm Neoma Laming Electronically signed by Neoma Laming Signature Date/Time: 04/27/2022/11:58:36 AM    Final     Microbiology: Recent Results (from the past 240 hour(s))  Resp panel by RT-PCR (RSV, Flu A&B, Covid) Anterior Nasal Swab     Status: None   Collection Time: 04/26/22  7:42 AM   Specimen: Anterior Nasal Swab  Result Value Ref Range Status   SARS Coronavirus 2 by RT PCR NEGATIVE NEGATIVE Final    Comment: (NOTE) SARS-CoV-2 target nucleic acids are NOT DETECTED.  The SARS-CoV-2 RNA is generally detectable in upper respiratory specimens during the acute phase of infection. The lowest concentration of SARS-CoV-2 viral copies this assay can detect is 138 copies/mL. A  negative result does not preclude SARS-Cov-2 infection and should not be used as the sole basis for treatment or other patient management decisions. A negative result may occur with  improper specimen collection/handling, submission of specimen other than nasopharyngeal swab, presence of viral mutation(s) within the areas targeted by this assay, and inadequate number of viral copies(<138 copies/mL). A  negative result must be combined with clinical observations, patient history, and epidemiological information. The expected result is Negative.  Fact Sheet for Patients:  EntrepreneurPulse.com.au  Fact Sheet for Healthcare Providers:  IncredibleEmployment.be  This test is no t yet approved or cleared by the [place] FDA and  has been authorized for detection and/or diagnosis of SARS-CoV-2 by FDA under an Emergency Use Authorization (EUA). This EUA will remain  in effect (meaning this test can be used) for the duration of the COVID-19 declaration under Section 564(b)(1) of the Act, 21 U.S.C.section 360bbb-3(b)(1), unless the authorization is terminated  or revoked sooner.       Influenza A by PCR NEGATIVE NEGATIVE Final   Influenza B by PCR NEGATIVE NEGATIVE Final    Comment: (NOTE) The Xpert Xpress SARS-CoV-2/FLU/RSV plus assay is intended as an aid in the diagnosis of influenza from Nasopharyngeal swab specimens and should not be used as a sole basis for treatment. Nasal washings and aspirates are unacceptable for Xpert Xpress SARS-CoV-2/FLU/RSV testing.  Fact Sheet for Patients: EntrepreneurPulse.com.au  Fact Sheet for Healthcare Providers: IncredibleEmployment.be  This test is not yet approved or cleared by the [place] FDA and has been authorized for detection and/or diagnosis of SARS-CoV-2 by FDA under an Emergency Use Authorization (EUA). This EUA will remain in effect (meaning this test can be used) for the duration of the COVID-19 declaration under Section 564(b)(1) of the Act, 21 U.S.C. section 360bbb-3(b)(1), unless the authorization is terminated or revoked.     Resp Syncytial Virus by PCR NEGATIVE NEGATIVE Final    Comment: (NOTE) Fact Sheet for Patients: EntrepreneurPulse.com.au  Fact Sheet for Healthcare  Providers: IncredibleEmployment.be  This test is not yet approved or cleared by the [place] FDA and has been authorized for detection and/or diagnosis of SARS-CoV-2 by FDA under an Emergency Use Authorization (EUA). This EUA will remain in effect (meaning this test can be used) for the duration of the COVID-19 declaration under Section 564(b)(1) of the Act, 21 U.S.C. section 360bbb-3(b)(1), unless the authorization is terminated or revoked.  Performed at [facility], [address] [place], [place] [address]   MRSA Next Gen by PCR, Nasal     Status: None   Collection Time: 04/26/22  1:04 PM   Specimen: Nasal Mucosa; Nasal Swab  Result Value Ref Range Status   MRSA by PCR Next Gen NOT DETECTED NOT DETECTED Final    Comment: (NOTE) The GeneXpert MRSA Assay (FDA approved for NASAL specimens only), is one component of a comprehensive MRSA colonization surveillance program. It is not intended to diagnose MRSA infection nor to guide or monitor treatment for MRSA infections. Test performance is not FDA approved in patients less than 6 years old. Performed at [facility], [address] [place], [place] [address]      Labs: CBC: Recent Labs  Lab 04/26/22 [id number] 04/27/22 0351 04/28/22 0552 04/29/22 0641  WBC 13.6* 7.9 4.9 4.2  HGB 15.0 11.6* 10.7* 10.8*  HCT 48.0* 34.9* 33.2* 32.5*  MCV 91.3 87.3 89.2 87.6  PLT 247 214 171 0000000   Basic Metabolic Panel: Recent Labs  Lab 04/26/22 1437  04/26/22 2216 04/27/22 0351 04/27/22 0808 04/28/22 0552 04/29/22 0641  NA 161* 158* 155* 153* 145 140  K 4.0 3.4* 3.5 3.9 4.4 4.0  CL 117* 122* 120* 119* 113* 110  CO2 '22 26 28 23 24 25  '$ GLUCOSE 336* 199* 169* 162* 290* 224*  BUN 30* 22* '18 16 14 11  '$ CREATININE 1.50* 1.16* 1.22* 1.21* 0.95 0.77  CALCIUM 10.1 9.2 8.9 8.3* 8.1* 8.0*  PHOS 3.0  --   --   --   --   --    Liver Function Tests: Recent Labs  Lab 04/26/22 0742  AST 35  ALT 23   ALKPHOS 117  BILITOT 1.9*  PROT 9.3*  ALBUMIN 4.3   No results for input(s): "LIPASE", "AMYLASE" in the last 168 hours. No results for input(s): "AMMONIA" in the last 168 hours. Cardiac Enzymes: No results for input(s): "CKTOTAL", "CKMB", "CKMBINDEX", "TROPONINI" in the last 168 hours. BNP (last 3 results) No results for input(s): "BNP" in the last 8760 hours. CBG: Recent Labs  Lab 04/28/22 1154 04/28/22 1524 04/28/22 2120 04/29/22 0735 04/29/22 1146  GLUCAP 303* 213* 170* 227* 217*    Time spent: 35 minutes  Signed:  Val Riles  [facility] 04/29/2022 1:34 PM

## 2022-04-29 NOTE — Progress Notes (Addendum)
Noted that PT recommended HHPT.  Attempted to speak with patient to set up, however she had already left hospital. Call placed to her cell phone twice, no answer, voice message left to call back.  Will await call back to complete planning for HHPT.  Update 1600: Attempted again to contact patient regarding HHPT order, no answer.  Will continue to wait call back.  Valente David, RN, MSN, CCM [facility] RNCM

## 2022-04-29 NOTE — Progress Notes (Signed)
[facility] Cardiology  SUBJECTIVE: Patient laying flat in bed, denies chest pain or shortness of breath   Vitals:   04/28/22 2116 04/28/22 2337 04/29/22 0353 04/29/22 0736  BP: 112/67 108/64 136/71 123/62  Pulse: 96 95 85 88  Resp: '18 18 18 16  '$ Temp: 98.1 F (36.7 C) 97.9 F (36.6 C) 98.6 F (37 C) 98.1 F (36.7 C)  TempSrc: Oral Oral Oral   SpO2: 99% 100% 98% 100%  Weight:      Height:         Intake/Output Summary (Last 24 hours) at 04/29/2022 [id number] Last data filed at 04/29/2022 0414 Gross per 24 hour  Intake 1726.39 ml  Output --  Net 1726.39 ml      PHYSICAL EXAM  General: Well developed, well nourished, in no acute distress HEENT:  Normocephalic and atramatic Neck:  No JVD.  Lungs: Clear bilaterally to auscultation and percussion. Heart: HRRR . Normal S1 and S2 without gallops or murmurs.  Abdomen: Bowel sounds are positive, abdomen soft and non-tender  Msk:  Back normal, normal gait. Normal strength and tone for age. Extremities: No clubbing, cyanosis or edema.   Neuro: Alert and oriented X 3. Psych:  Good affect, responds appropriately   LABS: Basic Metabolic Panel: Recent Labs    04/26/22 1437 04/26/22 2216 04/28/22 0552 04/29/22 0641  NA 161*   < > 145 140  K 4.0   < > 4.4 4.0  CL 117*   < > 113* 110  CO2 22   < > 24 25  GLUCOSE 336*   < > 290* 224*  BUN 30*   < > 14 11  CREATININE 1.50*   < > 0.95 0.77  CALCIUM 10.1   < > 8.1* 8.0*  PHOS 3.0  --   --   --    < > = values in this interval not displayed.   Liver Function Tests: No results for input(s): "AST", "ALT", "ALKPHOS", "BILITOT", "PROT", "ALBUMIN" in the last 72 hours. No results for input(s): "LIPASE", "AMYLASE" in the last 72 hours. CBC: Recent Labs    04/28/22 0552 04/29/22 0641  WBC 4.9 4.2  HGB 10.7* 10.8*  HCT 33.2* 32.5*  MCV 89.2 87.6  PLT 171 153   Cardiac Enzymes: No results for input(s): "CKTOTAL", "CKMB", "CKMBINDEX", "TROPONINI" in the last 72 hours. BNP: Invalid  input(s): "POCBNP" D-Dimer: No results for input(s): "DDIMER" in the last 72 hours. Hemoglobin A1C: Recent Labs    04/27/22 0351  HGBA1C 12.4*   Fasting Lipid Panel: Recent Labs    04/27/22 0351  CHOL 105  HDL 40*  LDLCALC 48  TRIG 86  CHOLHDL 2.6   Thyroid Function Tests: No results for input(s): "TSH", "T4TOTAL", "T3FREE", "THYROIDAB" in the last 72 hours.  Invalid input(s): "FREET3" Anemia Panel: Recent Labs    04/28/22 0552  VITAMINB12 552  FOLATE 12.3  FERRITIN 365*  TIBC 220*  IRON 109  RETICCTPCT 2.1    DG Chest Port 1 View  Result Date: 04/27/2022 CLINICAL DATA:  Ischemia EXAM: PORTABLE CHEST 1 VIEW COMPARISON:  None Available. FINDINGS: The heart size and mediastinal contours are within normal limits. Both lungs are clear. No consolidation, pneumothorax or effusion. No edema. The visualized skeletal structures are unremarkable. Overlapping cardiac leads. Film is slightly rotated to the right. IMPRESSION: No acute cardiopulmonary disease Electronically Signed   By: Jill Side M.D.   On: 04/27/2022 14:28   ECHOCARDIOGRAM COMPLETE  Result Date: 04/27/2022    ECHOCARDIOGRAM  REPORT   Patient Name:   Veronica Chandler Date of Exam: 04/27/2022 Medical Rec #:  [id number]               Height:       60.0 in Accession #:    [id number]              Weight:       255.3 lb Date of Birth:  09/15/1970               BSA:          2.071 m Patient Age:    52 years                BP:           131/71 mmHg Patient Gender: F                       HR:           101 bpm. Exam Location:  [facility] Procedure: 2D Echo, Cardiac Doppler and Color Doppler Indications:     Elevated troponin  History:         Patient has no prior history of Echocardiogram examinations.                  Risk Factors:Diabetes. Mediccal history was non-contributory.  Sonographer:     Sherrie Sport Referring Phys:  [id number] Start Diagnosing Phys: Neoma Laming  Sonographer Comments: Technically challenging study  due to limited acoustic windows, Technically difficult study due to poor echo windows, no apical window and no subcostal window. IMPRESSIONS  1. Left ventricular ejection fraction, by estimation, is 55 to 60%. The left ventricle has normal function. The left ventricle has no regional wall motion abnormalities. There is mild concentric left ventricular hypertrophy. Left ventricular diastolic function could not be evaluated.  2. Right ventricular systolic function is normal. The right ventricular size is normal.  3. The mitral valve is normal in structure. Trivial mitral valve regurgitation. No evidence of mitral stenosis.  4. The aortic valve is normal in structure. Aortic valve regurgitation is not visualized. No aortic stenosis is present.  5. The inferior vena cava is normal in size with greater than 50% respiratory variability, suggesting right atrial pressure of 3 mmHg. FINDINGS  Left Ventricle: Left ventricular ejection fraction, by estimation, is 55 to 60%. The left ventricle has normal function. The left ventricle has no regional wall motion abnormalities. The left ventricular internal cavity size was normal in size. There is  mild concentric left ventricular hypertrophy. Left ventricular diastolic function could not be evaluated. Right Ventricle: The right ventricular size is normal. No increase in right ventricular wall thickness. Right ventricular systolic function is normal. Left Atrium: Left atrial size was normal in size. Right Atrium: Right atrial size was normal in size. Pericardium: There is no evidence of pericardial effusion. Mitral Valve: The mitral valve is normal in structure. Trivial mitral valve regurgitation. No evidence of mitral valve stenosis. Tricuspid Valve: The tricuspid valve is normal in structure. Tricuspid valve regurgitation is trivial. No evidence of tricuspid stenosis. Aortic Valve: The aortic valve is normal in structure. Aortic valve regurgitation is not visualized. No aortic  stenosis is present. Pulmonic Valve: The pulmonic valve was normal in structure. Pulmonic valve regurgitation is not visualized. No evidence of pulmonic stenosis. Aorta: The aortic root is normal in size and structure. Venous: The inferior vena cava is normal in size with greater than  50% respiratory variability, suggesting right atrial pressure of 3 mmHg. IAS/Shunts: No atrial level shunt detected by color flow Doppler.  LEFT VENTRICLE PLAX 2D LVIDd:         2.80 cm LVIDs:         2.10 cm LV PW:         0.90 cm LV IVS:        0.90 cm LVOT diam:     2.00 cm LVOT Area:     3.14 cm  LEFT ATRIUM         Index LA diam:    3.50 cm 1.69 cm/m   AORTA Ao Root diam: 1.50 cm  SHUNTS Systemic Diam: 2.00 cm Neoma Laming Electronically signed by Neoma Laming Signature Date/Time: 04/27/2022/11:58:36 AM    Final      Echo EF 55-60% 04/27/2022  TELEMETRY: Sinus rhythm 90 bpm:  ASSESSMENT AND PLAN:  Principal Problem:   DKA (diabetic ketoacidosis) ([place]) Active Problems:   Morbid obesity ([place])   Breast cancer in situ   Diabetes mellitus without complication ([place])   Myocardial injury   Hypernatremia   AKI (acute kidney injury) ([place])   Dehydration   Hypercalcemia   Leukocytosis   Bacterial vaginosis    1.  Elevated troponin (257, 2769, 2247), in the absence of chest pain or shortness of breath, without ECG changes, in the setting of diabetic ketoacidosis, suspect demand supply ischemia, 2D echo 04/27/2022 revealed normal left ventricular function, without regional wall motion abnormalities 2.  DKA treated with Insulin drip transitioned to [place] insulin 3.  Acute kidney injury, improving   Recommendations   1.  Agree with current therapy 2.  Continue atorvastatin 3.  Defer cardiac catheterization at this time in absence of chest pain 4.  Follow-up with Dr. Humphrey Rolls in 1 to 2 weeks     Isaias Cowman, MD, PhD, [facility] 04/29/2022 9:23 AM

## 2022-04-30 ENCOUNTER — Ambulatory Visit: Payer: Commercial Managed Care - PPO

## 2022-04-30 ENCOUNTER — Telehealth: Payer: Self-pay

## 2022-04-30 NOTE — Transitions of Care (Post Inpatient/ED Visit) (Signed)
   04/30/2022  Name: Veronica Chandler MRN: [id number] DOB: 06-06-1970  Today's TOC FU Call Status: Today's TOC FU Call Status:: Successful TOC FU Call Competed TOC FU Call Complete Date: 04/30/22  Transition Care Management Follow-up Telephone Call Date of Discharge: 04/29/22 Discharge Facility: [facility]) Type of Discharge: Inpatient Admission Primary Inpatient Discharge Diagnosis:: DKA (diabetic ketoacidosis) How have you been since you were released from the hospital?: Better Any questions or concerns?: No  Items Reviewed: Did you receive and understand the discharge instructions provided?: Yes Medications obtained and verified?: Yes (Medications Reviewed) Any new allergies since your discharge?: No Dietary orders reviewed?: Yes Do you have support at home?: Yes  Home Care and Equipment/Supplies: [place] Ordered?: Yes Name of Shelly:: unsure of name Has Agency set up a time to come to your home?: No Any new equipment or medical supplies ordered?: No  Functional Questionnaire: Do you need assistance with bathing/showering or dressing?: No Do you need assistance with meal preparation?: No Do you need assistance with eating?: No Do you have difficulty maintaining continence: No Do you need assistance with getting out of bed/getting out of a chair/moving?: No Do you have difficulty managing or taking your medications?: No  Folllow up appointments reviewed: PCP Follow-up appointment confirmed?: Yes Date of PCP follow-up appointment?: 05/07/22 Follow-up Provider: [name] NP [facility] Follow-up appointment confirmed?: NA Do you need transportation to your follow-up appointment?: No Do you understand care options if your condition(s) worsen?: Yes-patient verbalized understanding    [place] LPN [place] Direct Dial [phone]

## 2022-04-30 NOTE — Telephone Encounter (Signed)
Will discuss at upcoming appt.

## 2022-05-01 ENCOUNTER — Ambulatory Visit: Payer: Commercial Managed Care - PPO

## 2022-05-02 ENCOUNTER — Ambulatory Visit: Payer: Commercial Managed Care - PPO

## 2022-05-03 ENCOUNTER — Ambulatory Visit: Payer: Commercial Managed Care - PPO

## 2022-05-03 ENCOUNTER — Other Ambulatory Visit: Payer: Self-pay | Admitting: Internal Medicine

## 2022-05-03 NOTE — Telephone Encounter (Signed)
Requested Prescriptions  Pending Prescriptions Disp Refills   levocetirizine (XYZAL) 5 MG tablet [Pharmacy Med Name: LEVOCETIRIZINE 5 MG TABLET] 90 tablet 0    Sig: TAKE 1 TABLET BY MOUTH EVERY DAY IN THE EVENING     Ear, Nose, and Throat:  Antihistamines - levocetirizine dihydrochloride Passed - 05/03/2022  1:53 AM      Passed - Cr in normal range and within 360 days    Creat  Date Value Ref Range Status  07/05/2021 0.85 0.50 - 1.03 mg/dL Final   Creatinine, Ser  Date Value Ref Range Status  04/29/2022 0.77 0.44 - 1.00 mg/dL Final   Creatinine, Urine  Date Value Ref Range Status  07/05/2021 66 20 - 275 mg/dL Final         Passed - eGFR is 10 or above and within 360 days    GFR, Estimated  Date Value Ref Range Status  04/29/2022 >60 >60 mL/min Final    Comment:    (NOTE) Calculated using the CKD-EPI Creatinine Equation (2021)    GFR  Date Value Ref Range Status  05/05/2020 84.63 >60.00 mL/min Final    Comment:    Calculated using the CKD-EPI Creatinine Equation (2021)   eGFR  Date Value Ref Range Status  07/05/2021 83 > OR = 60 mL/min/1.59m Final    Comment:    The eGFR is based on the CKD-EPI 2021 equation. To calculate  the new eGFR from a previous Creatinine or Cystatin C result, go to https://www.kidney.org/professionals/ kdoqi/gfr%5Fcalculator          Passed - Valid encounter within last 12 months    Recent Outpatient Visits           2 weeks ago [facility]aginal yeast infection   [facility] [place] [place]  5 months ago Screening for cervical cancer   [facility]   10 months ago Encounter for general adult medical examination with abnormal findings   [facility]   1 year ago RLQ abdominal pain   [facility]   1 year ago Type 2 diabetes mellitus with hyperglycemia, without long-term  current use of insulin ([facility]   [facility]       Future Appointments             In 4 days [place] [facility]

## 2022-05-04 ENCOUNTER — Ambulatory Visit: Payer: Commercial Managed Care - PPO

## 2022-05-07 ENCOUNTER — Ambulatory Visit (INDEPENDENT_AMBULATORY_CARE_PROVIDER_SITE_OTHER): Payer: Commercial Managed Care - PPO | Admitting: Internal Medicine

## 2022-05-07 ENCOUNTER — Ambulatory Visit: Payer: Commercial Managed Care - PPO

## 2022-05-07 ENCOUNTER — Encounter: Payer: Self-pay | Admitting: Internal Medicine

## 2022-05-07 VITALS — BP 126/72 | HR 80 | Temp 97.1°F | Wt 265.0 lb

## 2022-05-07 DIAGNOSIS — N179 Acute kidney failure, unspecified: Secondary | ICD-10-CM | POA: Diagnosis not present

## 2022-05-07 DIAGNOSIS — E1110 Type 2 diabetes mellitus with ketoacidosis without coma: Secondary | ICD-10-CM

## 2022-05-07 DIAGNOSIS — D0511 Intraductal carcinoma in situ of right breast: Secondary | ICD-10-CM | POA: Insufficient documentation

## 2022-05-07 DIAGNOSIS — E870 Hyperosmolality and hypernatremia: Secondary | ICD-10-CM

## 2022-05-07 DIAGNOSIS — I2489 Other forms of acute ischemic heart disease: Secondary | ICD-10-CM | POA: Diagnosis not present

## 2022-05-07 NOTE — Patient Instructions (Signed)

## 2022-05-07 NOTE — Progress Notes (Signed)
Subjective:    Patient ID: Veronica Chandler, female    DOB: October 14, 1970, 52 y.o.   MRN: [id number]  HPI  Patient presents to clinic today for TCM hospital follow-up.  She presented to the ER 2/22 with complaint of generalized weakness and decreased oral intake.  She was found to be hyperglycemic on admission with a blood sugar of 869, WBC 13.6, sodium 166, creatinine 2.33, GFR 25 with an anion gap of 32.  Her A1c was 12.1%.  Initial troponin was 257.  Troponin peaked at 2769. ECG showed sinus tach with frequent PVCs.  Cardiology was consulted.  Echo showed an EF of greater than 55%.  They deferred cardiac cath and advised her to follow-up with cardiology as an outpatient.  Chest x-ray was normal.   She was started on IV fluids and an insulin drip.  Hospital course was complicated by orthostasis.  This improved over the course of time with IV fluids.  She was discharged on 2/25 with Rx for NovoLog and Lantus.  Since that time, her sugars range 88- 251. She has not had any nausea or vomiting. She is having trouble seeing up close but not having blurred vision. She has an eye appt 3/11. She denies increased thirst or urinary frequency. She denies numbness, tingling or weakness.  Review of Systems     Past Medical History:  Diagnosis Date   Breast cancer in situ    ER+; DCIS right breast   Medical history non-contributory    Pre-diabetes    Type 2 diabetes mellitus (HCC)     Current Outpatient Medications  Medication Sig Dispense Refill   aspirin EC 81 MG tablet Take 1 tablet (81 mg total) by mouth daily. Swallow whole. 30 tablet 12   atorvastatin (LIPITOR) 40 MG tablet Take 1 tablet (40 mg total) by mouth every evening. 30 tablet 1   fluticasone (FLONASE) 50 MCG/ACT nasal spray SPRAY 2 SPRAYS INTO EACH NOSTRIL EVERY DAY 16 mL 0   HYDROcodone-acetaminophen (NORCO) 5-325 MG tablet Take 1 tablet by mouth every 6 (six) hours as needed for up to 6 doses for moderate pain. 6 tablet 0    insulin aspart (NOVOLOG) 100 UNIT/ML FlexPen Inject 5 Units into the skin 3 (three) times daily with meals. 15 mL 11   insulin glargine (LANTUS) 100 UNIT/ML Solostar Pen Inject 20 Units into the skin daily. 15 mL 11   levocetirizine (XYZAL) 5 MG tablet TAKE 1 TABLET BY MOUTH EVERY DAY IN THE EVENING 90 tablet 0   lidocaine-prilocaine (EMLA) cream Apply 1 Application topically once.     metroNIDAZOLE (FLAGYL) 500 MG tablet Take 1 tablet (500 mg total) by mouth 2 (two) times daily. 14 tablet 0   Multiple Vitamin (MULTIVITAMIN WITH MINERALS) TABS tablet Take 1 tablet by mouth daily.     Syringe/Needle, Disp, (SYRINGE 3CC/18GX1-1/2") 18G X 1-1/2" 3 ML MISC 1 Application by Does not apply route 3 (three) times daily. 30 each 3   No current facility-administered medications for this visit.    No Known Allergies  Family History  Problem Relation Age of Onset   Diabetes Father    Prostate cancer Father    Lung cancer Maternal Aunt    Pancreatic cancer Maternal Aunt    Lung cancer Paternal Aunt    Diabetes Paternal Grandmother    Breast cancer Neg Hx    Colon cancer Neg Hx    Ovarian cancer Neg Hx     Social History   Socioeconomic  History   Marital status: Divorced    Spouse name: Not on file   Number of children: Not on file   Years of education: Not on file   Highest education level: Not on file  Occupational History   Not on file  Tobacco Use   Smoking status: Never   Smokeless tobacco: Never  Vaping Use   Vaping Use: Never used  Substance and Sexual Activity   Alcohol use: Yes    Comment: social   Drug use: Yes    Types: Marijuana    Comment: occasional   Sexual activity: Not on file  Other Topics Concern   Not on file  Social History Narrative   Not on file   Social Determinants of Health   Financial Resource Strain: Not on file  Food Insecurity: No Food Insecurity (04/26/2022)   Hunger Vital Sign    Worried About Running Out of Food in the Last Year: Never true     Ran Out of Food in the Last Year: Never true  Transportation Needs: No Transportation Needs (04/26/2022)   PRAPARE - Hydrologist (Medical): No    Lack of Transportation (Non-Medical): No  Physical Activity: Not on file  Stress: Not on file  Social Connections: Not on file  Intimate Partner Violence: Not At Risk (04/26/2022)   Humiliation, Afraid, Rape, and Kick questionnaire    Fear of Current or Ex-Partner: No    Emotionally Abused: No    Physically Abused: No    Sexually Abused: No     Constitutional: Denies fever, malaise, fatigue, headache or abrupt weight changes.  HEENT: Pt reports vision changes. Denies eye pain, eye redness, ear pain, ringing in the ears, wax buildup, runny nose, nasal congestion, bloody nose, or sore throat. Respiratory: Denies difficulty breathing, shortness of breath, cough or sputum production.   Cardiovascular: Denies chest pain, chest tightness, palpitations or swelling in the hands or feet.  Gastrointestinal: Denies abdominal pain, bloating, constipation, diarrhea or blood in the stool.  GU: Denies urgency, frequency, pain with urination, burning sensation, blood in urine, odor or discharge. Musculoskeletal: Denies decrease in range of motion, difficulty with gait, muscle pain or joint pain and swelling.  Skin: Denies redness, rashes, lesions or ulcercations.  Neurological: Denies dizziness, difficulty with memory, difficulty with speech or problems with balance and coordination.  Psych: Denies anxiety, depression, SI/HI.  No other specific complaints in a complete review of systems (except as listed in HPI above).  Objective:   Physical Exam BP 126/72 (BP Location: Left Arm, Patient Position: Sitting, Cuff Size: Large)   Pulse 80   Temp (!) 97.1 F (36.2 C) (Temporal)   Wt 265 lb (120.2 kg)   SpO2 99%   BMI 51.75 kg/m   Wt Readings from Last 3 Encounters:  04/26/22 255 lb 4.7 oz (115.8 kg)  04/16/22 262 lb  (118.8 kg)  03/22/22 270 lb 12.8 oz (122.8 kg)    General: Appears her stated age, obese, in NAD. Skin: Warm, dry and intact. No ulcerations noted. HEENT: Head: normal shape and size; Eyes: sclera white, no icterus, conjunctiva pink, PERRLA and EOMs intact;  Cardiovascular: Normal rate and rhythm. S1,S2 noted.  No murmur, rubs or gallops noted. No JVD or BLE edema. No carotid bruits noted. Pulmonary/Chest: Normal effort and positive vesicular breath sounds. No respiratory distress. No wheezes, rales or ronchi noted.  Abdomen: Soft and nontender.  Musculoskeletal: No difficulty with gait.  Neurological: Alert and oriented. Coordination  normal.     BMET    Component Value Date/Time   NA 140 04/29/2022 0641   K 4.0 04/29/2022 0641   CL 110 04/29/2022 0641   CO2 25 04/29/2022 0641   GLUCOSE 224 (H) 04/29/2022 0641   BUN 11 04/29/2022 0641   CREATININE 0.77 04/29/2022 0641   CREATININE 0.85 07/05/2021 1006   CALCIUM 8.0 (L) 04/29/2022 0641   GFRNONAA >60 04/29/2022 0641    Lipid Panel     Component Value Date/Time   CHOL 105 04/27/2022 0351   TRIG 86 04/27/2022 0351   HDL 40 (L) 04/27/2022 0351   CHOLHDL 2.6 04/27/2022 0351   VLDL 17 04/27/2022 0351   LDLCALC 48 04/27/2022 0351   LDLCALC 95 07/05/2021 1006    CBC    Component Value Date/Time   WBC 4.2 04/29/2022 0641   RBC 3.71 (L) 04/29/2022 0641   HGB 10.8 (L) 04/29/2022 0641   HCT 32.5 (L) 04/29/2022 0641   PLT 153 04/29/2022 0641   MCV 87.6 04/29/2022 0641   MCH 29.1 04/29/2022 0641   MCHC 33.2 04/29/2022 0641   RDW 14.4 04/29/2022 0641   LYMPHSABS 2.5 02/05/2022 1549   MONOABS 0.4 02/05/2022 1549   EOSABS 0.2 02/05/2022 1549   BASOSABS 0.0 02/05/2022 1549    Hgb A1C Lab Results  Component Value Date   HGBA1C 12.4 (H) 04/27/2022            Assessment & Plan:   TCM hospital follow-up for DKA, Hypernatremia, Acute Kidney Injury, Demand Ischemia:  Hospital notes, labs and imaging reviewed We  will repeat CBC, c-Met today Encourage low-carb diet and exercise for weight loss She declines referral to diabetes education and nutrition Referral to cardiology placed for follow-up  RTC in 3 months for your annual exam [name], NP

## 2022-05-08 ENCOUNTER — Other Ambulatory Visit: Payer: Self-pay

## 2022-05-08 ENCOUNTER — Ambulatory Visit
Admission: RE | Admit: 2022-05-08 | Discharge: 2022-05-08 | Disposition: A | Discharge status: Home / Self Care | Payer: Commercial Managed Care - PPO | Source: Ambulatory Visit | Attending: Radiation Oncology | Admitting: Radiation Oncology

## 2022-05-08 DIAGNOSIS — D0511 Intraductal carcinoma in situ of right breast: Secondary | ICD-10-CM | POA: Diagnosis present

## 2022-05-08 DIAGNOSIS — Z510 Encounter for antineoplastic radiation therapy: Secondary | ICD-10-CM | POA: Diagnosis present

## 2022-05-08 LAB — RAD ONC ARIA SESSION SUMMARY
Course Elapsed Days: 33
Plan Fractions Treated to Date: 16
Plan Prescribed Dose Per Fraction: 1.8 Gy
Plan Total Fractions Prescribed: 28
Plan Total Prescribed Dose: 50.4 Gy
Reference Point Dosage Given to Date: 28.8 Gy
Reference Point Session Dosage Given: 1.8 Gy
Session Number: 16

## 2022-05-08 LAB — COMPLETE METABOLIC PANEL WITH GFR
AG Ratio: 1.2 (calc) (ref 1.0–2.5)
ALT: 28 U/L (ref 6–29)
AST: 17 U/L (ref 10–35)
Albumin: 3.5 g/dL — ABNORMAL LOW (ref 3.6–5.1)
Alkaline phosphatase (APISO): 82 U/L (ref 37–153)
BUN: 10 mg/dL (ref 7–25)
CO2: 29 mmol/L (ref 20–32)
Calcium: 9 mg/dL (ref 8.6–10.4)
Chloride: 104 mmol/L (ref 98–110)
Creat: 0.76 mg/dL (ref 0.50–1.03)
Globulin: 3 g/dL (calc) (ref 1.9–3.7)
Glucose, Bld: 137 mg/dL — ABNORMAL HIGH (ref 65–99)
Potassium: 4.3 mmol/L (ref 3.5–5.3)
Sodium: 140 mmol/L (ref 135–146)
Total Bilirubin: 0.3 mg/dL (ref 0.2–1.2)
Total Protein: 6.5 g/dL (ref 6.1–8.1)
eGFR: 94 mL/min/{1.73_m2} (ref >=60)

## 2022-05-09 ENCOUNTER — Ambulatory Visit
Admission: RE | Admit: 2022-05-09 | Discharge: 2022-05-09 | Disposition: A | Discharge status: Home / Self Care | Payer: Commercial Managed Care - PPO | Source: Ambulatory Visit | Attending: Radiation Oncology | Admitting: Radiation Oncology

## 2022-05-09 ENCOUNTER — Other Ambulatory Visit: Payer: Self-pay

## 2022-05-09 DIAGNOSIS — Z510 Encounter for antineoplastic radiation therapy: Secondary | ICD-10-CM | POA: Diagnosis not present

## 2022-05-09 LAB — RAD ONC ARIA SESSION SUMMARY
Course Elapsed Days: 34
Plan Fractions Treated to Date: 17
Plan Prescribed Dose Per Fraction: 1.8 Gy
Plan Total Fractions Prescribed: 28
Plan Total Prescribed Dose: 50.4 Gy
Reference Point Dosage Given to Date: 30.6 Gy
Reference Point Session Dosage Given: 1.8 Gy
Session Number: 17

## 2022-05-10 ENCOUNTER — Other Ambulatory Visit: Payer: Self-pay | Admitting: Internal Medicine

## 2022-05-10 ENCOUNTER — Other Ambulatory Visit: Payer: Self-pay

## 2022-05-10 ENCOUNTER — Inpatient Hospital Stay: Payer: Commercial Managed Care - PPO | Attending: Oncology

## 2022-05-10 ENCOUNTER — Ambulatory Visit
Admission: RE | Admit: 2022-05-10 | Discharge: 2022-05-10 | Disposition: A | Discharge status: Home / Self Care | Payer: Commercial Managed Care - PPO | Source: Ambulatory Visit | Attending: Radiation Oncology | Admitting: Radiation Oncology

## 2022-05-10 DIAGNOSIS — Z510 Encounter for antineoplastic radiation therapy: Secondary | ICD-10-CM | POA: Insufficient documentation

## 2022-05-10 DIAGNOSIS — D0511 Intraductal carcinoma in situ of right breast: Secondary | ICD-10-CM | POA: Insufficient documentation

## 2022-05-10 LAB — RAD ONC ARIA SESSION SUMMARY
Course Elapsed Days: 35
Plan Fractions Treated to Date: 18
Plan Prescribed Dose Per Fraction: 1.8 Gy
Plan Total Fractions Prescribed: 28
Plan Total Prescribed Dose: 50.4 Gy
Reference Point Dosage Given to Date: 32.4 Gy
Reference Point Session Dosage Given: 1.8 Gy
Session Number: 18

## 2022-05-10 MED ORDER — FREESTYLE LIBRE 3 SENSOR MISC: 1.0000 | 0 refills | Status: DC

## 2022-05-10 NOTE — Telephone Encounter (Signed)
Requested Prescriptions  Pending Prescriptions Disp Refills   fluticasone (FLONASE) 50 MCG/ACT nasal spray [Pharmacy Med Name: FLUTICASONE PROP 50 MCG SPRAY] 16 mL 2    Sig: SPRAY 2 SPRAYS INTO EACH NOSTRIL EVERY DAY     Ear, Nose, and Throat: Nasal Preparations - Corticosteroids Passed - 05/10/2022  1:51 AM      Passed - Valid encounter within last 12 months    Recent Outpatient Visits           3 days ago Diabetic ketoacidosis without coma associated with type 2 diabetes mellitus [facility])   [facility] [place], Coralie Keens, NP   3 weeks ago Vaginal yeast infection   [place], [place]   5 months ago Screening for cervical cancer   [facility] [place], Coralie Keens, NP   10 months ago Encounter for general adult medical examination with abnormal findings   [facility] [place], Coralie Keens, NP   1 year ago RLQ abdominal pain   [facility] [place], Coralie Keens, [place]

## 2022-05-11 ENCOUNTER — Ambulatory Visit
Admission: RE | Admit: 2022-05-11 | Discharge: 2022-05-11 | Disposition: A | Discharge status: Home / Self Care | Payer: Commercial Managed Care - PPO | Source: Ambulatory Visit | Attending: Radiation Oncology | Admitting: Radiation Oncology

## 2022-05-11 ENCOUNTER — Other Ambulatory Visit: Payer: Self-pay

## 2022-05-11 DIAGNOSIS — Z510 Encounter for antineoplastic radiation therapy: Secondary | ICD-10-CM | POA: Diagnosis not present

## 2022-05-11 LAB — RAD ONC ARIA SESSION SUMMARY
Course Elapsed Days: 36
Plan Fractions Treated to Date: 19
Plan Prescribed Dose Per Fraction: 1.8 Gy
Plan Total Fractions Prescribed: 28
Plan Total Prescribed Dose: 50.4 Gy
Reference Point Dosage Given to Date: 34.2 Gy
Reference Point Session Dosage Given: 1.8 Gy
Session Number: 19

## 2022-05-14 ENCOUNTER — Ambulatory Visit
Admission: RE | Admit: 2022-05-14 | Discharge: 2022-05-14 | Disposition: A | Discharge status: Home / Self Care | Payer: Commercial Managed Care - PPO | Source: Ambulatory Visit | Attending: Radiation Oncology | Admitting: Radiation Oncology

## 2022-05-14 ENCOUNTER — Other Ambulatory Visit: Payer: Self-pay

## 2022-05-14 DIAGNOSIS — Z510 Encounter for antineoplastic radiation therapy: Secondary | ICD-10-CM | POA: Diagnosis not present

## 2022-05-14 LAB — RAD ONC ARIA SESSION SUMMARY
Course Elapsed Days: 39
Plan Fractions Treated to Date: 20
Plan Prescribed Dose Per Fraction: 1.8 Gy
Plan Total Fractions Prescribed: 28
Plan Total Prescribed Dose: 50.4 Gy
Reference Point Dosage Given to Date: 36 Gy
Reference Point Session Dosage Given: 1.8 Gy
Session Number: 20

## 2022-05-15 ENCOUNTER — Ambulatory Visit
Admission: RE | Admit: 2022-05-15 | Discharge: 2022-05-15 | Disposition: A | Discharge status: Home / Self Care | Payer: Commercial Managed Care - PPO | Source: Ambulatory Visit | Attending: Radiation Oncology | Admitting: Radiation Oncology

## 2022-05-15 ENCOUNTER — Ambulatory Visit: Payer: Commercial Managed Care - PPO

## 2022-05-15 ENCOUNTER — Other Ambulatory Visit: Payer: Self-pay

## 2022-05-15 DIAGNOSIS — Z510 Encounter for antineoplastic radiation therapy: Secondary | ICD-10-CM | POA: Diagnosis not present

## 2022-05-15 LAB — RAD ONC ARIA SESSION SUMMARY
Course Elapsed Days: 40
Plan Fractions Treated to Date: 21
Plan Prescribed Dose Per Fraction: 1.8 Gy
Plan Total Fractions Prescribed: 28
Plan Total Prescribed Dose: 50.4 Gy
Reference Point Dosage Given to Date: 37.8 Gy
Reference Point Session Dosage Given: 1.8 Gy
Session Number: 21

## 2022-05-15 LAB — HM DIABETES EYE EXAM

## 2022-05-16 ENCOUNTER — Other Ambulatory Visit: Payer: Self-pay

## 2022-05-16 ENCOUNTER — Ambulatory Visit: Payer: Commercial Managed Care - PPO

## 2022-05-16 ENCOUNTER — Ambulatory Visit
Admission: RE | Admit: 2022-05-16 | Discharge: 2022-05-16 | Disposition: A | Discharge status: Home / Self Care | Payer: Commercial Managed Care - PPO | Source: Ambulatory Visit | Attending: Radiation Oncology | Admitting: Radiation Oncology

## 2022-05-16 DIAGNOSIS — Z510 Encounter for antineoplastic radiation therapy: Secondary | ICD-10-CM | POA: Diagnosis not present

## 2022-05-16 LAB — RAD ONC ARIA SESSION SUMMARY
Course Elapsed Days: 41
Plan Fractions Treated to Date: 22
Plan Prescribed Dose Per Fraction: 1.8 Gy
Plan Total Fractions Prescribed: 28
Plan Total Prescribed Dose: 50.4 Gy
Reference Point Dosage Given to Date: 39.6 Gy
Reference Point Session Dosage Given: 1.8 Gy
Session Number: 22

## 2022-05-17 ENCOUNTER — Ambulatory Visit: Payer: Commercial Managed Care - PPO

## 2022-05-17 ENCOUNTER — Ambulatory Visit
Admission: RE | Admit: 2022-05-17 | Discharge: 2022-05-17 | Disposition: A | Discharge status: Home / Self Care | Payer: Commercial Managed Care - PPO | Source: Ambulatory Visit | Attending: Radiation Oncology | Admitting: Radiation Oncology

## 2022-05-17 ENCOUNTER — Other Ambulatory Visit: Payer: Self-pay | Admitting: *Deleted

## 2022-05-17 ENCOUNTER — Other Ambulatory Visit: Payer: Self-pay

## 2022-05-17 DIAGNOSIS — C50411 Malignant neoplasm of upper-outer quadrant of right female breast: Secondary | ICD-10-CM

## 2022-05-17 DIAGNOSIS — Z510 Encounter for antineoplastic radiation therapy: Secondary | ICD-10-CM | POA: Diagnosis not present

## 2022-05-17 LAB — RAD ONC ARIA SESSION SUMMARY
Course Elapsed Days: 42
Plan Fractions Treated to Date: 23
Plan Prescribed Dose Per Fraction: 1.8 Gy
Plan Total Fractions Prescribed: 28
Plan Total Prescribed Dose: 50.4 Gy
Reference Point Dosage Given to Date: 41.4 Gy
Reference Point Session Dosage Given: 1.8 Gy
Session Number: 23

## 2022-05-18 ENCOUNTER — Ambulatory Visit
Admission: RE | Admit: 2022-05-18 | Discharge: 2022-05-18 | Disposition: A | Discharge status: Home / Self Care | Payer: Commercial Managed Care - PPO | Source: Ambulatory Visit | Attending: Radiation Oncology | Admitting: Radiation Oncology

## 2022-05-18 ENCOUNTER — Ambulatory Visit: Payer: Commercial Managed Care - PPO

## 2022-05-18 ENCOUNTER — Other Ambulatory Visit: Payer: Self-pay

## 2022-05-18 DIAGNOSIS — Z510 Encounter for antineoplastic radiation therapy: Secondary | ICD-10-CM | POA: Diagnosis not present

## 2022-05-18 LAB — RAD ONC ARIA SESSION SUMMARY
Course Elapsed Days: 43
Plan Fractions Treated to Date: 24
Plan Prescribed Dose Per Fraction: 1.8 Gy
Plan Total Fractions Prescribed: 28
Plan Total Prescribed Dose: 50.4 Gy
Reference Point Dosage Given to Date: 43.2 Gy
Reference Point Session Dosage Given: 1.8 Gy
Session Number: 24

## 2022-05-21 ENCOUNTER — Other Ambulatory Visit: Payer: Self-pay

## 2022-05-21 ENCOUNTER — Ambulatory Visit: Payer: Commercial Managed Care - PPO

## 2022-05-21 ENCOUNTER — Encounter: Payer: Self-pay | Admitting: General Practice

## 2022-05-21 ENCOUNTER — Ambulatory Visit
Admission: RE | Admit: 2022-05-21 | Discharge: 2022-05-21 | Disposition: A | Discharge status: Home / Self Care | Payer: Commercial Managed Care - PPO | Source: Ambulatory Visit | Attending: Radiation Oncology | Admitting: Radiation Oncology

## 2022-05-21 DIAGNOSIS — Z510 Encounter for antineoplastic radiation therapy: Secondary | ICD-10-CM | POA: Diagnosis not present

## 2022-05-21 LAB — RAD ONC ARIA SESSION SUMMARY
Course Elapsed Days: 46
Plan Fractions Treated to Date: 25
Plan Prescribed Dose Per Fraction: 1.8 Gy
Plan Total Fractions Prescribed: 28
Plan Total Prescribed Dose: 50.4 Gy
Reference Point Dosage Given to Date: 45 Gy
Reference Point Session Dosage Given: 1.8 Gy
Session Number: 25

## 2022-05-22 ENCOUNTER — Ambulatory Visit
Admission: RE | Admit: 2022-05-22 | Discharge: 2022-05-22 | Disposition: A | Discharge status: Home / Self Care | Payer: Commercial Managed Care - PPO | Source: Ambulatory Visit | Attending: Radiation Oncology | Admitting: Radiation Oncology

## 2022-05-22 ENCOUNTER — Other Ambulatory Visit: Payer: Self-pay

## 2022-05-22 ENCOUNTER — Ambulatory Visit: Payer: Commercial Managed Care - PPO

## 2022-05-22 DIAGNOSIS — Z510 Encounter for antineoplastic radiation therapy: Secondary | ICD-10-CM | POA: Diagnosis not present

## 2022-05-22 LAB — RAD ONC ARIA SESSION SUMMARY
Course Elapsed Days: 47
Plan Fractions Treated to Date: 26
Plan Prescribed Dose Per Fraction: 1.8 Gy
Plan Total Fractions Prescribed: 28
Plan Total Prescribed Dose: 50.4 Gy
Reference Point Dosage Given to Date: 46.8 Gy
Reference Point Session Dosage Given: 1.8 Gy
Session Number: 26

## 2022-05-23 ENCOUNTER — Other Ambulatory Visit: Payer: Self-pay

## 2022-05-23 ENCOUNTER — Ambulatory Visit: Payer: Commercial Managed Care - PPO

## 2022-05-23 ENCOUNTER — Ambulatory Visit
Admission: RE | Admit: 2022-05-23 | Discharge: 2022-05-23 | Disposition: A | Discharge status: Home / Self Care | Payer: Commercial Managed Care - PPO | Source: Ambulatory Visit | Attending: Radiation Oncology | Admitting: Radiation Oncology

## 2022-05-23 DIAGNOSIS — Z510 Encounter for antineoplastic radiation therapy: Secondary | ICD-10-CM | POA: Diagnosis not present

## 2022-05-23 LAB — RAD ONC ARIA SESSION SUMMARY
Course Elapsed Days: 48
Plan Fractions Treated to Date: 27
Plan Prescribed Dose Per Fraction: 1.8 Gy
Plan Total Fractions Prescribed: 28
Plan Total Prescribed Dose: 50.4 Gy
Reference Point Dosage Given to Date: 48.6 Gy
Reference Point Session Dosage Given: 1.8 Gy
Session Number: 27

## 2022-05-24 ENCOUNTER — Ambulatory Visit: Payer: Commercial Managed Care - PPO

## 2022-05-24 ENCOUNTER — Other Ambulatory Visit: Payer: Self-pay

## 2022-05-24 ENCOUNTER — Inpatient Hospital Stay: Payer: Commercial Managed Care - PPO

## 2022-05-24 DIAGNOSIS — Z510 Encounter for antineoplastic radiation therapy: Secondary | ICD-10-CM | POA: Diagnosis not present

## 2022-05-24 DIAGNOSIS — Z170 Estrogen receptor positive status [ER+]: Secondary | ICD-10-CM

## 2022-05-24 LAB — CBC (CANCER CENTER ONLY)
HCT: 35.1 % — ABNORMAL LOW (ref 36.0–46.0)
Hemoglobin: 11.8 g/dL — ABNORMAL LOW (ref 12.0–15.0)
MCH: 29.7 pg (ref 26.0–34.0)
MCHC: 33.6 g/dL (ref 30.0–36.0)
MCV: 88.4 fL (ref 80.0–100.0)
Platelet Count: 273 10*3/uL (ref 150–400)
RBC: 3.97 MIL/uL (ref 3.87–5.11)
RDW: 13.9 % (ref 11.5–15.5)
WBC Count: 6 10*3/uL (ref 4.0–10.5)
nRBC: 0 % (ref 0.0–0.2)

## 2022-05-24 LAB — RAD ONC ARIA SESSION SUMMARY
Course Elapsed Days: 49
Plan Fractions Treated to Date: 28
Plan Prescribed Dose Per Fraction: 1.8 Gy
Plan Total Fractions Prescribed: 28
Plan Total Prescribed Dose: 50.4 Gy
Reference Point Dosage Given to Date: 50.4 Gy
Reference Point Session Dosage Given: 1.8 Gy
Session Number: 28

## 2022-05-25 ENCOUNTER — Ambulatory Visit: Payer: Commercial Managed Care - PPO

## 2022-05-25 ENCOUNTER — Encounter: Payer: Self-pay | Admitting: Internal Medicine

## 2022-05-25 ENCOUNTER — Ambulatory Visit
Admission: RE | Admit: 2022-05-25 | Discharge: 2022-05-25 | Disposition: A | Discharge status: Home / Self Care | Payer: Commercial Managed Care - PPO | Source: Ambulatory Visit | Attending: Radiation Oncology | Admitting: Radiation Oncology

## 2022-05-25 ENCOUNTER — Other Ambulatory Visit: Payer: Self-pay

## 2022-05-25 DIAGNOSIS — Z510 Encounter for antineoplastic radiation therapy: Secondary | ICD-10-CM | POA: Diagnosis not present

## 2022-05-25 LAB — RAD ONC ARIA SESSION SUMMARY
Course Elapsed Days: 50
Plan Fractions Treated to Date: 1
Plan Prescribed Dose Per Fraction: 2 Gy
Plan Total Fractions Prescribed: 5
Plan Total Prescribed Dose: 10 Gy
Reference Point Dosage Given to Date: 2 Gy
Reference Point Session Dosage Given: 2 Gy
Session Number: 29

## 2022-05-28 ENCOUNTER — Other Ambulatory Visit: Payer: Self-pay

## 2022-05-28 ENCOUNTER — Ambulatory Visit
Admission: RE | Admit: 2022-05-28 | Discharge: 2022-05-28 | Disposition: A | Discharge status: Home / Self Care | Payer: Commercial Managed Care - PPO | Source: Ambulatory Visit | Attending: Radiation Oncology | Admitting: Radiation Oncology

## 2022-05-28 ENCOUNTER — Telehealth: Payer: Self-pay | Admitting: Internal Medicine

## 2022-05-28 DIAGNOSIS — Z510 Encounter for antineoplastic radiation therapy: Secondary | ICD-10-CM | POA: Diagnosis not present

## 2022-05-28 LAB — RAD ONC ARIA SESSION SUMMARY
Course Elapsed Days: 53
Plan Fractions Treated to Date: 2
Plan Prescribed Dose Per Fraction: 2 Gy
Plan Total Fractions Prescribed: 5
Plan Total Prescribed Dose: 10 Gy
Reference Point Dosage Given to Date: 4 Gy
Reference Point Session Dosage Given: 2 Gy
Session Number: 30

## 2022-05-28 NOTE — Telephone Encounter (Signed)
Gave patient information and she said thank you.

## 2022-05-28 NOTE — Telephone Encounter (Signed)
Medication Refill - Medication: Continuous Blood Gluc Sensor (FREESTYLE LIBRE 3 SENSOR) MISC [id number]   Pt is calling to request if one could be sent in every 14 days. If 3 are sent in the cost is $224  Has the patient contacted their pharmacy? Yes.   (Agent: If no, request that the patient contact the pharmacy for the refill. If patient does not wish to contact the pharmacy document the reason why and proceed with request.) (Agent: If yes, when and what did the pharmacy advise?)  Preferred Pharmacy (with phone number or street name): [id number] [place], [place] - [address], [place] [address] Phone: [phone]  Fax: [phone]  Has the patient been seen for an appointment in the last year OR does the patient have an upcoming appointment? Yes.    Agent: Please be advised that RX refills may take up to 3 business days. We ask that you follow-up with your pharmacy.

## 2022-05-28 NOTE — Telephone Encounter (Signed)
[facility] called and spoke to [place], [facility] about the refill(s) Freestyle Libre 3 Sensor requested. Advised it was sent on 05/10/22 #6/0 refill(s) and what the patient is requesting. He says she picked up 2 the last time for 28 days and that she can ask them to refill it how she wants to get it each time. He says she has plenty refills left. Patient called, left VM to return the call the office regarding this. When she returns the call, let her know the above.

## 2022-05-29 ENCOUNTER — Ambulatory Visit
Admission: RE | Admit: 2022-05-29 | Discharge: 2022-05-29 | Disposition: A | Discharge status: Home / Self Care | Payer: Commercial Managed Care - PPO | Source: Ambulatory Visit | Attending: Radiation Oncology | Admitting: Radiation Oncology

## 2022-05-29 ENCOUNTER — Other Ambulatory Visit: Payer: Self-pay

## 2022-05-29 DIAGNOSIS — Z510 Encounter for antineoplastic radiation therapy: Secondary | ICD-10-CM | POA: Diagnosis not present

## 2022-05-29 LAB — RAD ONC ARIA SESSION SUMMARY
Course Elapsed Days: 54
Plan Fractions Treated to Date: 3
Plan Prescribed Dose Per Fraction: 2 Gy
Plan Total Fractions Prescribed: 5
Plan Total Prescribed Dose: 10 Gy
Reference Point Dosage Given to Date: 6 Gy
Reference Point Session Dosage Given: 2 Gy
Session Number: 31

## 2022-05-30 ENCOUNTER — Ambulatory Visit: Payer: Commercial Managed Care - PPO

## 2022-05-30 ENCOUNTER — Ambulatory Visit
Admission: RE | Admit: 2022-05-30 | Discharge: 2022-05-30 | Disposition: A | Discharge status: Home / Self Care | Payer: Commercial Managed Care - PPO | Source: Ambulatory Visit | Attending: Radiation Oncology | Admitting: Radiation Oncology

## 2022-05-30 ENCOUNTER — Other Ambulatory Visit: Payer: Self-pay

## 2022-05-30 DIAGNOSIS — Z510 Encounter for antineoplastic radiation therapy: Secondary | ICD-10-CM | POA: Diagnosis not present

## 2022-05-30 LAB — RAD ONC ARIA SESSION SUMMARY
Course Elapsed Days: 55
Plan Fractions Treated to Date: 4
Plan Prescribed Dose Per Fraction: 2 Gy
Plan Total Fractions Prescribed: 5
Plan Total Prescribed Dose: 10 Gy
Reference Point Dosage Given to Date: 8 Gy
Reference Point Session Dosage Given: 2 Gy
Session Number: 32

## 2022-05-31 ENCOUNTER — Encounter: Payer: Self-pay | Admitting: *Deleted

## 2022-05-31 ENCOUNTER — Ambulatory Visit
Admission: RE | Admit: 2022-05-31 | Discharge: 2022-05-31 | Disposition: A | Discharge status: Home / Self Care | Payer: Commercial Managed Care - PPO | Source: Ambulatory Visit | Attending: Radiation Oncology | Admitting: Radiation Oncology

## 2022-05-31 ENCOUNTER — Other Ambulatory Visit: Payer: Self-pay

## 2022-05-31 DIAGNOSIS — Z510 Encounter for antineoplastic radiation therapy: Secondary | ICD-10-CM | POA: Diagnosis not present

## 2022-05-31 LAB — RAD ONC ARIA SESSION SUMMARY
Course Elapsed Days: 56
Plan Fractions Treated to Date: 5
Plan Prescribed Dose Per Fraction: 2 Gy
Plan Total Fractions Prescribed: 5
Plan Total Prescribed Dose: 10 Gy
Reference Point Dosage Given to Date: 10 Gy
Reference Point Session Dosage Given: 2 Gy
Session Number: 33

## 2022-06-05 ENCOUNTER — Inpatient Hospital Stay: Payer: Commercial Managed Care - PPO | Admitting: Oncology

## 2022-06-19 ENCOUNTER — Inpatient Hospital Stay: Payer: Commercial Managed Care - PPO | Attending: Oncology | Admitting: Oncology

## 2022-06-19 ENCOUNTER — Encounter: Payer: Self-pay | Admitting: Oncology

## 2022-06-19 VITALS — BP 170/91 | HR 88 | Temp 96.9°F | Resp 18 | Wt 263.8 lb

## 2022-06-19 DIAGNOSIS — E1110 Type 2 diabetes mellitus with ketoacidosis without coma: Secondary | ICD-10-CM | POA: Insufficient documentation

## 2022-06-19 DIAGNOSIS — Z881 Allergy status to other antibiotic agents status: Secondary | ICD-10-CM | POA: Insufficient documentation

## 2022-06-19 DIAGNOSIS — Z833 Family history of diabetes mellitus: Secondary | ICD-10-CM | POA: Diagnosis not present

## 2022-06-19 DIAGNOSIS — Z923 Personal history of irradiation: Secondary | ICD-10-CM | POA: Diagnosis not present

## 2022-06-19 DIAGNOSIS — Z8042 Family history of malignant neoplasm of prostate: Secondary | ICD-10-CM | POA: Insufficient documentation

## 2022-06-19 DIAGNOSIS — Z79810 Long term (current) use of selective estrogen receptor modulators (SERMs): Secondary | ICD-10-CM | POA: Insufficient documentation

## 2022-06-19 DIAGNOSIS — Z79899 Other long term (current) drug therapy: Secondary | ICD-10-CM | POA: Insufficient documentation

## 2022-06-19 DIAGNOSIS — D0511 Intraductal carcinoma in situ of right breast: Secondary | ICD-10-CM | POA: Insufficient documentation

## 2022-06-19 DIAGNOSIS — Z510 Encounter for antineoplastic radiation therapy: Secondary | ICD-10-CM | POA: Diagnosis present

## 2022-06-19 DIAGNOSIS — Z809 Family history of malignant neoplasm, unspecified: Secondary | ICD-10-CM

## 2022-06-19 DIAGNOSIS — D0591 Unspecified type of carcinoma in situ of right breast: Secondary | ICD-10-CM

## 2022-06-19 DIAGNOSIS — Z801 Family history of malignant neoplasm of trachea, bronchus and lung: Secondary | ICD-10-CM | POA: Diagnosis not present

## 2022-06-19 DIAGNOSIS — Z7982 Long term (current) use of aspirin: Secondary | ICD-10-CM | POA: Insufficient documentation

## 2022-06-19 DIAGNOSIS — Z170 Estrogen receptor positive status [ER+]: Secondary | ICD-10-CM | POA: Diagnosis not present

## 2022-06-19 DIAGNOSIS — Z800 Family history of malignant neoplasm of digestive organs: Secondary | ICD-10-CM | POA: Diagnosis not present

## 2022-06-19 MED ORDER — TAMOXIFEN CITRATE 20 MG PO TABS: 20.0000 mg | ORAL_TABLET | Freq: Every day | ORAL | 2 refills | Status: DC

## 2022-06-19 NOTE — Progress Notes (Signed)
Survivorship Care Plan visit completed.  Treatment summary reviewed and given to patient.  ASCO answers booklet reviewed and given to patient.  CARE program and Cancer Transitions discussed with patient along with other resources cancer center offers to patients and caregivers.  Patient verbalized understanding.    

## 2022-06-19 NOTE — Assessment & Plan Note (Signed)
Recommend genetic testing.  She declines.  

## 2022-06-19 NOTE — Assessment & Plan Note (Addendum)
Final pathology was discussed with patient.  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  Discussed about adjuvant endocrine therapy. - perimenopausal Side effects of tamoxifen including but not limited to hot flush,  fatigue, retinopathy, thrombosis, uterus endometrial cancer discussed with patient. Patient voices understanding and she prefers to wait until she sees her PCP to further discuss starting on Tamoxifen. I sent her a new Rx and she can start after seeing PCP.  Recommend annual pelvic Gyn examination.  She is on Aspirin 81mg  daily for diabetes.

## 2022-06-19 NOTE — Progress Notes (Signed)
Hematology/Oncology Consult Note [phone] [phone]     CHIEF COMPLAINTS/PURPOSE OF CONSULTATION:  Right breast DCIS  ASSESSMENT & PLAN:   Cancer Staging  DCIS (ductal carcinoma in situ) of breast Staging form: Breast, AJCC 8th Edition - Clinical stage from 02/05/2022: Stage 0 (cTis (DCIS), cN0, cM0, ER: Not Assessed, PR: Not Assessed, HER2: Not Assessed) - Signed by Rickard Patience, MD on 03/22/2022 - Pathologic stage from 03/01/2022: pTis (DCIS), pN0, G2, ER+, PR: Not Assessed, HER2: Not Assessed - Signed by Rickard Patience, MD on 03/22/2022   DCIS (ductal carcinoma in situ) of breast Final pathology was discussed with patient.  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  Discussed about adjuvant endocrine therapy. - perimenopausal Side effects of tamoxifen including but not limited to hot flush,  fatigue, retinopathy, thrombosis, uterus endometrial cancer discussed with patient. Patient voices understanding and she prefers to wait until she sees her PCP to further discuss starting on Tamoxifen. I sent her a new Rx and she can start after seeing PCP.  Recommend annual pelvic Gyn examination.  She is on Aspirin  daily for diabetes.    Family history of cancer Recommend genetic testing.  She declines.   Orders Placed This Encounter  Procedures   CBC with Differential (Cancer Center Only)    Standing Status:   Future    Standing Expiration Date:   06/19/2023   CMP (Cancer Center only)    Standing Status:   Future    Standing Expiration Date:   06/19/2023    Follow up in 3 months.  All questions were answered. The patient knows to call the clinic with any problems, questions or concerns.  Rickard Patience, MD, PhD [facility] Hematology Oncology 06/19/2022    HISTORY OF PRESENTING ILLNESS:  Veronica Chandler 52 y.o. 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 Summary of oncologic history is as follows: Oncology History  DCIS (ductal carcinoma in situ) of breast  12/07/2021 Mammogram   Bilateral screen mammogram showed  In the right breast, calcifications warrant further evaluation with magnified views. In the left breast, no findings suspicious for malignancy   01/11/2022 Imaging   Unilateral right diagnostic mammogram showed . A 17 mm group of calcifications in the upper outer RIGHT breast at middle depth are indeterminate. Recommend stereotactic guided biopsy for definitive characterization.    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  Pathology showed high grade DCIS, comedo type. Calcifications associated with DCIS.    Menarche at 52 yo Age at first child born 52 yo No previous OCP use No previous chest radiation, or breast biopsy.  Family history positive for father with prostate cancer, paternal aunt with lung cancer.    02/05/2022 Cancer Staging   Staging form: Breast, AJCC 8th Edition - Clinical stage from 02/05/2022: Stage 0 (cTis (DCIS), cN0, cM0, ER: Not Assessed, PR: Not Assessed, HER2: Not Assessed) - Signed by Rickard Patience, MD on 03/22/2022 Stage prefix: Initial diagnosis Nuclear grade: G3   02/19/2022 Surgery   S/p right lumpecotmy + SLNB  Pathology showed DCIS, intermediate grade, background benign mammary parenchyma with fibrocystic and apocrine changes and fibroadenomatoid changes.  Negative for invasive carcinoma. 2 sentinel lymph nodes were harvested and both were negative for malignancy.  Estrogen receptor positive -90%    03/01/2022 Cancer Staging   Staging form: Breast, AJCC 8th Edition - Pathologic stage from 03/01/2022: pTis (DCIS), pN0, G2, ER+, PR: Not Assessed, HER2: Not Assessed - Signed by Rickard Patience, MD on 03/22/2022 Stage prefix: Initial  diagnosis Nuclear grade: G2 Histologic grading system: 3 grade system    Patient is status post lumpectomy.  She presents to discuss pathology results and future management plan.  Denies no new complaints.  INTERVAL HISTORY Veronica Chandler is a 52 y.o. female who has above history reviewed by me today presents for follow up visit for right breast DCIS S/p Radiation.  She had an episode of DKA and was admitted to hospital. HbA1c 12 Now she is on insulin, and Aspirin  daily she follows up with PCP  MEDICAL HISTORY:  Past Medical History:  Diagnosis Date   Breast cancer in situ    ER+; DCIS right breast   Medical history non-contributory    Pre-diabetes    Type 2 diabetes mellitus     SURGICAL HISTORY: Past Surgical History:  Procedure Laterality Date   BREAST BIOPSY Right 02/01/2022   stereo bx, calcs, "X" clip-DCIS   BREAST BIOPSY Right 02/01/2022   MM RT BREAST BX W LOC DEV 1ST LESION IMAGE BX SPEC STEREO GUIDE 02/01/2022 [facility]   BREAST BIOPSY Right 02/14/2022   MM RT RADIO FREQUENCY TAG LOC MAMMO GUIDE 02/14/2022 [facility]   COLONOSCOPY WITH PROPOFOL N/A 06/16/2020   Procedure: COLONOSCOPY WITH PROPOFOL;  Surgeon: Wyline Mood, MD;  Location: [facility] ENDOSCOPY;  Service: Gastroenterology;  Laterality: N/A;   NO PAST SURGERIES     PART MASTECTOMY,RADIO FREQUENCY LOCALIZER,AXILLARY SENTINEL NODE BIOPSY Right 03/01/2022   Procedure: PART MASTECTOMY,RADIO FREQUENCY LOCALIZER,AXILLARY SENTINEL NODE BIOPSY;  Surgeon: Sung Amabile, DO;  Location: [facility] ORS;  Service: General;  Laterality: Right;    SOCIAL HISTORY: Social History   Socioeconomic History   Marital status: Divorced    Spouse name: Not on file   Number of children: Not on file   Years of education: Not on file   Highest education level: Not on file  Occupational History   Not on file  Tobacco Use   Smoking status: Never   Smokeless tobacco: Never  Vaping Use   Vaping Use: Never  used  Substance and Sexual Activity   Alcohol use: Yes    Comment: social   Drug use: Yes    Types: Marijuana    Comment: occasional   Sexual activity: Not on file  Other Topics Concern   Not on file  Social History Narrative   Not on file   Social Determinants of Health   Financial Resource Strain: Not on file  Food Insecurity: No Food Insecurity (04/26/2022)   Hunger Vital Sign    Worried About Running Out of Food in the Last Year: Never true    Ran Out of Food in the Last Year: Never true  Transportation Needs: No Transportation Needs (04/26/2022)   PRAPARE - Transportation    Lack of Transportation (Medical): No    Lack of Transportation (Non-Medical): No  Physical Activity: Not on file  Stress: Not on file  Social Connections: Not on file  Intimate Partner Violence: Not At Risk (04/26/2022)   Humiliation, Afraid, Rape, and Kick questionnaire    Fear of Current or Ex-Partner: No    Emotionally Abused: No    Physically Abused: No    Sexually Abused: No    FAMILY HISTORY: Family History  Problem Relation Age of Onset   Diabetes Father    Prostate cancer Father    Lung cancer Maternal Aunt    Pancreatic cancer Maternal Aunt    Lung cancer Paternal Aunt    Diabetes Paternal Grandmother    Breast cancer Neg Hx    Colon cancer Neg Hx    Ovarian cancer Neg Hx     ALLERGIES:  is allergic to flagyl [metronidazole].  MEDICATIONS:  Current Outpatient Medications  Medication Sig Dispense Refill   aspirin EC 81 MG tablet Take 1 tablet (81 mg total) by mouth daily. Swallow whole. 30 tablet 12   atorvastatin (LIPITOR) 40 MG tablet Take 1 tablet (40 mg total) by mouth every evening. 30 tablet 1   Continuous Blood Gluc Sensor (FREESTYLE LIBRE 3 SENSOR) MISC 1 Device by Does not apply route every 14 (fourteen) days. Place 1 sensor on the skin every 14 days. Use to check glucose continuously 6 each 0   fluticasone (FLONASE) 50 MCG/ACT nasal spray SPRAY 2 SPRAYS INTO EACH  NOSTRIL EVERY DAY 16 mL 2   insulin aspart (NOVOLOG) 100 UNIT/ML FlexPen Inject 5 Units into the skin 3 (three) times daily with meals. 15 mL 11   insulin glargine (LANTUS) 100 UNIT/ML Solostar Pen Inject 20 Units into the skin daily. 15 mL 11   levocetirizine (XYZAL) 5 MG tablet TAKE 1 TABLET BY MOUTH EVERY DAY IN THE EVENING 90 tablet 0   lidocaine-prilocaine (EMLA) cream Apply 1 Application topically once.     Multiple Vitamin (MULTIVITAMIN WITH MINERALS) TABS tablet Take 1 tablet by mouth daily.     Syringe/Needle, Disp, (SYRINGE 3CC/18GX1-1/2") 18G X 1-1/2" 3 ML MISC 1 Application by Does not apply route 3 (three) times daily. 30 each 3   tamoxifen (NOLVADEX) 20 MG tablet Take 1 tablet (20 mg total) by mouth daily. 30 tablet 2   No current facility-administered medications for this visit.    Review of Systems  Constitutional:  Negative for appetite change, chills, fatigue and fever.  HENT:   Negative for hearing loss and voice change.   Eyes:  Negative for eye problems.  Respiratory:  Negative for chest tightness and cough.   Cardiovascular:  Negative for chest pain.  Gastrointestinal:  Negative for abdominal distention, abdominal pain and blood in stool.  Endocrine: Negative for hot flashes.  Genitourinary:  Negative for difficulty urinating and frequency.   Musculoskeletal:  Negative for arthralgias.  Skin:  Negative for itching and rash.  Neurological:  Negative for extremity weakness.  Hematological:  Negative for adenopathy.  Psychiatric/Behavioral:  Negative for confusion.      PHYSICAL EXAMINATION: ECOG PERFORMANCE STATUS: 0 - Asymptomatic  Vitals:   06/19/22 1033  BP: (!) 170/91  Pulse: 88  Resp: 18  Temp: (!) 96.9 F (36.1 C)  SpO2: 100%   Filed Weights   06/19/22 1033  Weight: 263 lb 12.8 oz (119.7 kg)    Physical Exam Constitutional:      General: She is not in acute distress.    Appearance: She is not diaphoretic.  HENT:     Head: Normocephalic and  atraumatic.     Nose: Nose normal.     Mouth/Throat:     Pharynx: No oropharyngeal exudate.  Eyes:     General: No scleral icterus.    Pupils: Pupils are equal, round, and reactive to light.  Cardiovascular:     Rate and Rhythm: Normal rate and regular rhythm.     Heart sounds: No  murmur heard. Pulmonary:     Effort: Pulmonary effort is normal. No respiratory distress.     Breath sounds: No rales.  Chest:     Chest wall: No tenderness.  Abdominal:     General: There is no distension.     Palpations: Abdomen is soft.     Tenderness: There is no abdominal tenderness.  Musculoskeletal:        General: Normal range of motion.     Cervical back: Normal range of motion and neck supple.  Skin:    General: Skin is warm and dry.     Findings: No erythema.  Neurological:     Mental Status: She is alert and oriented to person, place, and time.     Cranial Nerves: No cranial nerve deficit.     Motor: No abnormal muscle tone.     Coordination: Coordination normal.  Psychiatric:        Mood and Affect: Mood and affect normal.      LABORATORY DATA:  I have reviewed the data as listed    Latest Ref Rng & Units 05/24/2022   10:41 AM 04/29/2022    6:41 AM 04/28/2022    5:52 AM  CBC  WBC 4.0 - 10.5 K/uL 6.0  4.2  4.9   Hemoglobin 12.0 - 15.0 g/dL 81.7  71.1  65.7   Hematocrit 36.0 - 46.0 % 35.1  32.5  33.2   Platelets 150 - 400 K/uL 273  153  171       Latest Ref Rng & Units 05/07/2022    2:08 PM 04/29/2022    6:41 AM 04/28/2022    5:52 AM  CMP  Glucose 65 - 99 mg/dL 903  833  383   BUN 7 - 25 mg/dL 10  11  14    Creatinine 0.50 - 1.03 mg/dL 2.91  9.16  6.06   Sodium 135 - 146 mmol/L 140  140  145   Potassium 3.5 - 5.3 mmol/L 4.3  4.0  4.4   Chloride 98 - 110 mmol/L 104  110  113   CO2 20 - 32 mmol/L 29  25  24    Calcium 8.6 - 10.4 mg/dL 9.0  8.0  8.1   Total Protein 6.1 - 8.1 g/dL 6.5     Total Bilirubin 0.2 - 1.2 mg/dL 0.3     AST 10 - 35 U/L 17     ALT 6 - 29 U/L 28         RADIOGRAPHIC STUDIES: I have personally reviewed the radiological images as listed and agreed with the findings in the report. No results found.

## 2022-06-20 ENCOUNTER — Encounter: Payer: Self-pay | Admitting: *Deleted

## 2022-07-04 ENCOUNTER — Ambulatory Visit
Admission: RE | Admit: 2022-07-04 | Discharge: 2022-07-04 | Disposition: A | Discharge status: Home / Self Care | Payer: Commercial Managed Care - PPO | Source: Ambulatory Visit | Attending: Radiation Oncology | Admitting: Radiation Oncology

## 2022-07-04 ENCOUNTER — Encounter: Payer: Self-pay | Admitting: Radiation Oncology

## 2022-07-04 ENCOUNTER — Ambulatory Visit: Payer: Commercial Managed Care - PPO | Admitting: Internal Medicine

## 2022-07-04 VITALS — BP 160/89 | HR 96 | Temp 98.0°F | Resp 16 | Ht 61.0 in | Wt 264.0 lb

## 2022-07-04 DIAGNOSIS — Z923 Personal history of irradiation: Secondary | ICD-10-CM | POA: Insufficient documentation

## 2022-07-04 DIAGNOSIS — D0511 Intraductal carcinoma in situ of right breast: Secondary | ICD-10-CM | POA: Insufficient documentation

## 2022-07-04 DIAGNOSIS — C50911 Malignant neoplasm of unspecified site of right female breast: Secondary | ICD-10-CM

## 2022-07-04 DIAGNOSIS — Z170 Estrogen receptor positive status [ER+]: Secondary | ICD-10-CM | POA: Diagnosis not present

## 2022-07-04 NOTE — Progress Notes (Signed)
Radiation Oncology Follow up Note  Name: Veronica Chandler   Date:   07/04/2022 MRN:  [id number] DOB: August 10, 1970    This 52 y.o. female presents to the clinic today for 1 month follow-up status post.  Whole breast radiation to her right breast for ductal carcinoma in situ ER positive  REFERRING PROVIDER: Lorre Munroe, NP  HPI: Patient is a 52 year old female now out 1 month status post whole breast radiation to her right breast for ER positive ductal carcinoma in situ.  Seen today in routine follow-up she is doing well.  She specifically denies breast tenderness cough or bone pain.  She has not yet started endocrine therapy but that is being arranged..  COMPLICATIONS OF TREATMENT: none  FOLLOW UP COMPLIANCE: keeps appointments   PHYSICAL EXAM:  BP (!) 160/89 (BP Location: Right Wrist, Patient Position: Sitting, Cuff Size: Small)   Pulse 96   Temp 98 F (36.7 C) (Tympanic)   Resp 16   Ht 5\' 1"  (1.549 m)   Wt 264 lb (119.7 kg)   BMI 49.88 kg/m  Lungs are clear to A&P cardiac examination essentially unremarkable with regular rate and rhythm. No dominant mass or nodularity is noted in either breast in 2 positions examined. Incision is well-healed. No axillary or supraclavicular adenopathy is appreciated. Cosmetic result is excellent.  Well-developed well-nourished patient in NAD. HEENT reveals PERLA, EOMI, discs not visualized.  Oral cavity is clear. No oral mucosal lesions are identified. Neck is clear without evidence of cervical or supraclavicular adenopathy. Lungs are clear to A&P. Cardiac examination is essentially unremarkable with regular rate and rhythm without murmur rub or thrill. Abdomen is benign with no organomegaly or masses noted. Motor sensory and DTR levels are equal and symmetric in the upper and lower extremities. Cranial nerves II through XII are grossly intact. Proprioception is intact. No peripheral adenopathy or edema is identified. No motor or sensory levels are  noted. Crude visual fields are within normal range.  RADIOLOGY RESULTS: No current films for review  PLAN: At the present time patient is doing well very low side effect profile from her whole breast radiation and pleased with her overall progress.  Asked to see her back in 6 months for follow-up.  She is scheduled to meet with medical oncology to discuss endocrine therapy.  Patient is to call with any concerns.  I would like to take this opportunity to thank you for allowing me to participate in the care of your patient.Carmina Miller, MD

## 2022-07-11 ENCOUNTER — Ambulatory Visit (INDEPENDENT_AMBULATORY_CARE_PROVIDER_SITE_OTHER): Payer: Commercial Managed Care - PPO | Admitting: Internal Medicine

## 2022-07-11 ENCOUNTER — Encounter: Payer: Self-pay | Admitting: Internal Medicine

## 2022-07-11 ENCOUNTER — Other Ambulatory Visit: Payer: Self-pay | Admitting: Internal Medicine

## 2022-07-11 VITALS — BP 120/74 | HR 87 | Temp 96.8°F | Ht 61.0 in | Wt 267.0 lb

## 2022-07-11 DIAGNOSIS — E1165 Type 2 diabetes mellitus with hyperglycemia: Secondary | ICD-10-CM | POA: Diagnosis not present

## 2022-07-11 DIAGNOSIS — Z0001 Encounter for general adult medical examination with abnormal findings: Secondary | ICD-10-CM | POA: Diagnosis not present

## 2022-07-11 NOTE — Assessment & Plan Note (Addendum)
Encourage diet and exercise for weight loss 

## 2022-07-11 NOTE — Progress Notes (Signed)
Subjective:    Patient ID: Veronica Chandler, female    DOB: October 03, 1970, 52 y.o.   MRN: [id number]  HPI  Patient presents to clinic today for her annual exam.  Flu: 12/2021 Tetanus: 08/2017 COVID: Moderna x 3 Pneumovax: 05/2020 Shingrix: Never Pap smear: 11/2021 Mammogram: 01/2022 Colon screening: 06/2020 Vision screening: annually Dentist: as needed  Diet: She does eat meat. She consumes fruits and veggies. She tries to avoid fried foods. She drinks juice, water. Exercise: Walking  Review of Systems     Past Medical History:  Diagnosis Date   Breast cancer in situ    ER+; DCIS right breast   Medical history non-contributory    Pre-diabetes    Type 2 diabetes mellitus (HCC)     Current Outpatient Medications  Medication Sig Dispense Refill   aspirin EC 81 MG tablet Take 1 tablet (81 mg total) by mouth daily. Swallow whole. 30 tablet 12   atorvastatin (LIPITOR) 40 MG tablet Take 1 tablet (40 mg total) by mouth every evening. 30 tablet 1   Continuous Blood Gluc Sensor (FREESTYLE LIBRE 3 SENSOR) MISC 1 Device by Does not apply route every 14 (fourteen) days. Place 1 sensor on the skin every 14 days. Use to check glucose continuously 6 each 0   fluticasone (FLONASE) 50 MCG/ACT nasal spray SPRAY 2 SPRAYS INTO EACH NOSTRIL EVERY DAY 16 mL 2   insulin aspart (NOVOLOG) 100 UNIT/ML FlexPen Inject 5 Units into the skin 3 (three) times daily with meals. 15 mL 11   insulin glargine (LANTUS) 100 UNIT/ML Solostar Pen Inject 20 Units into the skin daily. 15 mL 11   levocetirizine (XYZAL) 5 MG tablet TAKE 1 TABLET BY MOUTH EVERY DAY IN THE EVENING 90 tablet 0   lidocaine-prilocaine (EMLA) cream Apply 1 Application topically once.     Multiple Vitamin (MULTIVITAMIN WITH MINERALS) TABS tablet Take 1 tablet by mouth daily.     Syringe/Needle, Disp, (SYRINGE 3CC/18GX1-1/2") 18G X 1-1/2" 3 ML MISC 1 Application by Does not apply route 3 (three) times daily. 30 each 3   tamoxifen  (NOLVADEX) 20 MG tablet Take 1 tablet (20 mg total) by mouth daily. 30 tablet 2   No current facility-administered medications for this visit.    Allergies  Allergen Reactions   Flagyl [Metronidazole] Other (See Comments)    Causes blurred vision, dizziness, fainting.     Family History  Problem Relation Age of Onset   Diabetes Father    Prostate cancer Father    Lung cancer Maternal Aunt    Pancreatic cancer Maternal Aunt    Lung cancer Paternal Aunt    Diabetes Paternal Grandmother    Breast cancer Neg Hx    Colon cancer Neg Hx    Ovarian cancer Neg Hx     Social History   Socioeconomic History   Marital status: Divorced    Spouse name: Not on file   Number of children: Not on file   Years of education: Not on file   Highest education level: Professional school degree (e.g., MD, DDS, DVM, JD)  Occupational History   Not on file  Tobacco Use   Smoking status: Never   Smokeless tobacco: Never  Vaping Use   Vaping Use: Never used  Substance and Sexual Activity   Alcohol use: Yes    Comment: social   Drug use: Yes    Types: Marijuana    Comment: occasional   Sexual activity: Not on file  Other Topics Concern  Not on file  Social History Narrative   Not on file   Social Determinants of Health   Financial Resource Strain: Medium Risk (07/03/2022)   Overall Financial Resource Strain (CARDIA)    Difficulty of Paying Living Expenses: Somewhat hard  Food Insecurity: Food Insecurity Present (07/03/2022)   Hunger Vital Sign    Worried About Running Out of Food in the Last Year: Sometimes true    Ran Out of Food in the Last Year: Sometimes true  Transportation Needs: No Transportation Needs (07/03/2022)   PRAPARE - Administrator, Civil Service (Medical): No    Lack of Transportation (Non-Medical): No  Physical Activity: Sufficiently Active (07/03/2022)   Exercise Vital Sign    Days of Exercise per Week: 5 days    Minutes of Exercise per Session: 30 min   Stress: No Stress Concern Present (07/03/2022)   [employer] of Occupational Health - Occupational Stress Questionnaire    Feeling of Stress : Not at all  Social Connections: Moderately Isolated (07/03/2022)   Social Connection and Isolation Panel [NHANES]    Frequency of Communication with Friends and Family: More than three times a week    Frequency of Social Gatherings with Friends and Family: Once a week    Attends Religious Services: 1 to 4 times per year    Active Member of [employer] or Organizations: No    Attends Engineer, structural: Not on file    Marital Status: Divorced  Intimate Partner Violence: Not At Risk (04/26/2022)   Humiliation, Afraid, Rape, and Kick questionnaire    Fear of Current or Ex-Partner: No    Emotionally Abused: No    Physically Abused: No    Sexually Abused: No     Constitutional: Denies fever, malaise, fatigue, headache or abrupt weight changes.  HEENT: Denies eye pain, eye redness, ear pain, ringing in the ears, wax buildup, runny nose, nasal congestion, bloody nose, or sore throat. Respiratory: Denies difficulty breathing, shortness of breath, cough or sputum production.   Cardiovascular: Denies chest pain, chest tightness, palpitations or swelling in the hands or feet.  Gastrointestinal: Denies abdominal pain, bloating, constipation, diarrhea or blood in the stool.  GU: Denies urgency, frequency, pain with urination, burning sensation, blood in urine, odor or discharge. Musculoskeletal: Denies decrease in range of motion, difficulty with gait, muscle pain or joint pain and swelling.  Skin: Denies redness, rashes, lesions or ulcercations.  Neurological: Denies dizziness, difficulty with memory, difficulty with speech or problems with balance and coordination.  Psych: Denies anxiety, depression, SI/HI.  No other specific complaints in a complete review of systems (except as listed in HPI above).  Objective:   Physical Exam   BP 120/74  (BP Location: Left Wrist, Patient Position: Sitting, Cuff Size: Normal)   Pulse 87   Temp (!) 96.8 F (36 C) (Temporal)   Ht 5\' 1"  (1.549 m)   Wt 267 lb (121.1 kg)   SpO2 96%   BMI 50.45 kg/m   Wt Readings from Last 3 Encounters:  07/04/22 264 lb (119.7 kg)  06/19/22 263 lb 12.8 oz (119.7 kg)  05/07/22 265 lb (120.2 kg)    General: Appears her stated age, obese, in NAD. Skin: Warm, dry and intact. No ulcerations noted. HEENT: Head: normal shape and size; Eyes: sclera white, no icterus, conjunctiva pink, PERRLA and EOMs intact;  Neck:  Neck supple, trachea midline. No masses, lumps or thyromegaly present.  Cardiovascular: Normal rate and rhythm. S1,S2 noted.  No murmur,  rubs or gallops noted. No JVD or BLE edema. No carotid bruits noted. Pulmonary/Chest: Normal effort and positive vesicular breath sounds. No respiratory distress. No wheezes, rales or ronchi noted.  Abdomen:  Normal bowel sounds.  Musculoskeletal: Strength 5/5 BUE/BLE. No difficulty with gait.  Neurological: Alert and oriented. Cranial nerves II-XII grossly intact. Coordination normal.  Psychiatric: Mood and affect normal. Behavior is normal. Judgment and thought content normal.    BMET    Component Value Date/Time   NA 140 05/07/2022 1408   K 4.3 05/07/2022 1408   CL 104 05/07/2022 1408   CO2 29 05/07/2022 1408   GLUCOSE 137 (H) 05/07/2022 1408   BUN 10 05/07/2022 1408   CREATININE 0.76 05/07/2022 1408   CALCIUM 9.0 05/07/2022 1408   GFRNONAA >60 04/29/2022 0641    Lipid Panel     Component Value Date/Time   CHOL 105 04/27/2022 0351   TRIG 86 04/27/2022 0351   HDL 40 (L) 04/27/2022 0351   CHOLHDL 2.6 04/27/2022 0351   VLDL 17 04/27/2022 0351   LDLCALC 48 04/27/2022 0351   LDLCALC 95 07/05/2021 1006    CBC    Component Value Date/Time   WBC 6.0 05/24/2022 1041   WBC 4.2 04/29/2022 0641   RBC 3.97 05/24/2022 1041   HGB 11.8 (L) 05/24/2022 1041   HCT 35.1 (L) 05/24/2022 1041   PLT 273  05/24/2022 1041   MCV 88.4 05/24/2022 1041   MCH 29.7 05/24/2022 1041   MCHC 33.6 05/24/2022 1041   RDW 13.9 05/24/2022 1041   LYMPHSABS 2.5 02/05/2022 1549   MONOABS 0.4 02/05/2022 1549   EOSABS 0.2 02/05/2022 1549   BASOSABS 0.0 02/05/2022 1549    Hgb A1C Lab Results  Component Value Date   HGBA1C 12.4 (H) 04/27/2022           Assessment & Plan:   Preventative Health Maintenance:  Encouraged her to get a flu shot in the fall Tetanus UTD Encouraged her to get her COVID booster Pneumovax UTD Discussed Shingrix vaccine, she will check coverage with her insurance company and schedule visit if she would like to have this done Pap smear UTD Mammogram UTD Colon screening UTD Encouraged her to consume a balanced diet and exercise regimen Advised her to see an eye doctor and dentist annually Recent labs reviewed  RTC in 6 months, follow-up chronic conditions Nicki Reaper, NP

## 2022-07-11 NOTE — Patient Instructions (Signed)
Health Maintenance for Postmenopausal Women Menopause is a normal process in which your ability to get pregnant comes to an end. This process happens slowly over many months or years, usually between the ages of 48 and 55. Menopause is complete when you have missed your menstrual period for 12 months. It is important to talk with your health care provider about some of the most common conditions that affect women after menopause (postmenopausal women). These include heart disease, cancer, and bone loss (osteoporosis). Adopting a healthy lifestyle and getting preventive care can help to promote your health and wellness. The actions you take can also lower your chances of developing some of these common conditions. What are the signs and symptoms of menopause? During menopause, you may have the following symptoms: Hot flashes. These can be moderate or severe. Night sweats. Decrease in sex drive. Mood swings. Headaches. Tiredness (fatigue). Irritability. Memory problems. Problems falling asleep or staying asleep. Talk with your health care provider about treatment options for your symptoms. Do I need hormone replacement therapy? Hormone replacement therapy is effective in treating symptoms that are caused by menopause, such as hot flashes and night sweats. Hormone replacement carries certain risks, especially as you become older. If you are thinking about using estrogen or estrogen with progestin, discuss the benefits and risks with your health care provider. How can I reduce my risk for heart disease and stroke? The risk of heart disease, heart attack, and stroke increases as you age. One of the causes may be a change in the body's hormones during menopause. This can affect how your body uses dietary fats, triglycerides, and cholesterol. Heart attack and stroke are medical emergencies. There are many things that you can do to help prevent heart disease and stroke. Watch your blood pressure High  blood pressure causes heart disease and increases the risk of stroke. This is more likely to develop in people who have high blood pressure readings or are overweight. Have your blood pressure checked: Every 3-5 years if you are 18-39 years of age. Every year if you are 40 years old or older. Eat a healthy diet  Eat a diet that includes plenty of vegetables, fruits, low-fat dairy products, and lean protein. Do not eat a lot of foods that are high in solid fats, added sugars, or sodium. Get regular exercise Get regular exercise. This is one of the most important things you can do for your health. Most adults should: Try to exercise for at least 150 minutes each week. The exercise should increase your heart rate and make you sweat (moderate-intensity exercise). Try to do strengthening exercises at least twice each week. Do these in addition to the moderate-intensity exercise. Spend less time sitting. Even light physical activity can be beneficial. Other tips Work with your health care provider to achieve or maintain a healthy weight. Do not use any products that contain nicotine or tobacco. These products include cigarettes, chewing tobacco, and vaping devices, such as e-cigarettes. If you need help quitting, ask your health care provider. Know your numbers. Ask your health care provider to check your cholesterol and your blood sugar (glucose). Continue to have your blood tested as directed by your health care provider. Do I need screening for cancer? Depending on your health history and family history, you may need to have cancer screenings at different stages of your life. This may include screening for: Breast cancer. Cervical cancer. Lung cancer. Colorectal cancer. What is my risk for osteoporosis? After menopause, you may be   at increased risk for osteoporosis. Osteoporosis is a condition in which bone destruction happens more quickly than new bone creation. To help prevent osteoporosis or  the bone fractures that can happen because of osteoporosis, you may take the following actions: If you are 19-50 years old, get at least 1,000 mg of calcium and at least 600 international units (IU) of vitamin D per day. If you are older than age 50 but younger than age 70, get at least 1,200 mg of calcium and at least 600 international units (IU) of vitamin D per day. If you are older than age 70, get at least 1,200 mg of calcium and at least 800 international units (IU) of vitamin D per day. Smoking and drinking excessive alcohol increase the risk of osteoporosis. Eat foods that are rich in calcium and vitamin D, and do weight-bearing exercises several times each week as directed by your health care provider. How does menopause affect my mental health? Depression may occur at any age, but it is more common as you become older. Common symptoms of depression include: Feeling depressed. Changes in sleep patterns. Changes in appetite or eating patterns. Feeling an overall lack of motivation or enjoyment of activities that you previously enjoyed. Frequent crying spells. Talk with your health care provider if you think that you are experiencing any of these symptoms. General instructions See your health care provider for regular wellness exams and vaccines. This may include: Scheduling regular health, dental, and eye exams. Getting and maintaining your vaccines. These include: Influenza vaccine. Get this vaccine each year before the flu season begins. Pneumonia vaccine. Shingles vaccine. Tetanus, diphtheria, and pertussis (Tdap) booster vaccine. Your health care provider may also recommend other immunizations. Tell your health care provider if you have ever been abused or do not feel safe at home. Summary Menopause is a normal process in which your ability to get pregnant comes to an end. This condition causes hot flashes, night sweats, decreased interest in sex, mood swings, headaches, or lack  of sleep. Treatment for this condition may include hormone replacement therapy. Take actions to keep yourself healthy, including exercising regularly, eating a healthy diet, watching your weight, and checking your blood pressure and blood sugar levels. Get screened for cancer and depression. Make sure that you are up to date with all your vaccines. This information is not intended to replace advice given to you by your health care provider. Make sure you discuss any questions you have with your health care provider. Document Revised: 07/11/2020 Document Reviewed: 07/11/2020 Elsevier Patient Education  2023 Elsevier Inc.  

## 2022-07-11 NOTE — Telephone Encounter (Signed)
Requested Prescriptions  Pending Prescriptions Disp Refills   levocetirizine (XYZAL) 5 MG tablet [Pharmacy Med Name: LEVOCETIRIZINE 5 MG TABLET] 90 tablet 0    Sig: TAKE 1 TABLET BY MOUTH EVERY DAY IN THE EVENING     Ear, Nose, and Throat:  Antihistamines - levocetirizine dihydrochloride Passed - 07/11/2022  9:25 AM      Passed - Cr in normal range and within 360 days    Creat  Date Value Ref Range Status  05/07/2022 0.76 0.50 - 1.03 mg/dL Final   Creatinine, Urine  Date Value Ref Range Status  07/05/2021 66 20 - 275 mg/dL Final         Passed - eGFR is 10 or above and within 360 days    GFR, Estimated  Date Value Ref Range Status  04/29/2022 >60 >60 mL/min Final    Comment:    (NOTE) Calculated using the CKD-EPI Creatinine Equation (2021)    GFR  Date Value Ref Range Status  05/05/2020 84.63 >60.00 mL/min Final    Comment:    Calculated using the CKD-EPI Creatinine Equation (2021)   eGFR  Date Value Ref Range Status  05/07/2022 94 > OR = 60 mL/min/1.54m2 Final         Passed - Valid encounter within last 12 months    Recent Outpatient Visits           Today Encounter for general adult medical examination with abnormal findings   [place] [facility] [place], Salvadore Oxford, NP   2 months ago Diabetic ketoacidosis without coma associated with type 2 diabetes mellitus [facility])   [place] [facility] [place], Salvadore Oxford, NP   2 months ago Vaginal yeast infection   [place] [facility] [place], Oswaldo [place], [place]   7 months ago Screening for cervical cancer   [place] [facility] [place], Salvadore Oxford, NP   1 year ago Encounter for general adult medical examination with abnormal findings   [place] [facility] [place], Salvadore Oxford, NP       Future Appointments             In 6 months Baity, Salvadore Oxford, NP [facility], [facility]

## 2022-07-14 ENCOUNTER — Other Ambulatory Visit: Payer: Self-pay | Admitting: Internal Medicine

## 2022-07-16 NOTE — Telephone Encounter (Signed)
Requested Prescriptions  Pending Prescriptions Disp Refills   Continuous Glucose Sensor (FREESTYLE LIBRE 3 SENSOR) MISC [Pharmacy Med Name: FREESTYLE LIBRE 3 SENSOR] 2 each 2    Sig: PLACE 1 SENSOR ON THE SKIN EVERY 14 DAYS. USE TO CHECK GLUCOSE CONTINUOUSLY     Endocrinology: Diabetes - Testing Supplies Passed - 07/14/2022  2:59 PM      Passed - Valid encounter within last 12 months    Recent Outpatient Visits           5 days ago Encounter for general adult medical examination with abnormal findings   [place] [facility], [place] W, NP   2 months ago Diabetic ketoacidosis without coma associated with type 2 diabetes mellitus [facility])   [place] [place], Salvadore Oxford, NP   3 months ago Vaginal yeast infection   [place] [facility] [place], Oswaldo [place], [place]   7 months ago Screening for cervical cancer   [place] [facility] [place], Salvadore Oxford, NP   1 year ago Encounter for general adult medical examination with abnormal findings   [facility] [place], Salvadore Oxford, NP       Future Appointments             In 6 months Baity, Salvadore Oxford, NP [place] [facility], [facility]

## 2022-07-27 ENCOUNTER — Other Ambulatory Visit: Payer: Commercial Managed Care - PPO

## 2022-07-27 DIAGNOSIS — Z0001 Encounter for general adult medical examination with abnormal findings: Secondary | ICD-10-CM

## 2022-07-27 DIAGNOSIS — E1165 Type 2 diabetes mellitus with hyperglycemia: Secondary | ICD-10-CM

## 2022-07-28 LAB — MICROALBUMIN / CREATININE URINE RATIO
Creatinine, Urine: 58 mg/dL (ref 20–275)
Microalb Creat Ratio: 3 mg/g creat (ref <30)
Microalb, Ur: 0.2 mg/dL

## 2022-07-28 LAB — LIPID PANEL
Cholesterol: 162 mg/dL (ref <200)
HDL: 59 mg/dL (ref >=50)
LDL Cholesterol (Calc): 89 mg/dL (calc)
Non-HDL Cholesterol (Calc): 103 mg/dL (calc) (ref <130)
Total CHOL/HDL Ratio: 2.7 (calc) (ref <5.0)
Triglycerides: 57 mg/dL (ref <150)

## 2022-07-28 LAB — HEMOGLOBIN A1C
Hgb A1c MFr Bld: 6.6 % of total Hgb — ABNORMAL HIGH (ref <5.7)
Mean Plasma Glucose: 143 mg/dL
eAG (mmol/L): 7.9 mmol/L

## 2022-09-10 ENCOUNTER — Other Ambulatory Visit: Payer: Self-pay | Admitting: Internal Medicine

## 2022-09-11 NOTE — Telephone Encounter (Signed)
Requested Prescriptions  Pending Prescriptions Disp Refills   fluticasone (FLONASE) 50 MCG/ACT nasal spray [Pharmacy Med Name: FLUTICASONE PROP 50 MCG SPRAY] 16 mL 2    Sig: SPRAY 2 SPRAYS INTO EACH NOSTRIL EVERY DAY     Ear, Nose, and Throat: Nasal Preparations - Corticosteroids Passed - 09/10/2022  1:38 PM      Passed - Valid encounter within last 12 months    Recent Outpatient Visits           2 months ago Encounter for general adult medical examination with abnormal findings   [place] [facility] [place], [place] W, NP   4 months ago Diabetic ketoacidosis without coma associated with type 2 diabetes mellitus [facility])   [place] [facility] [place], Salvadore Oxford, NP   4 months ago Vaginal yeast infection   [place] [facility] [place], Oswaldo [place], [place]   9 months ago Screening for cervical cancer   [place] [facility] [place], Salvadore Oxford, NP   1 year ago Encounter for general adult medical examination with abnormal findings   [place] [facility] [place], Salvadore Oxford, NP       Future Appointments             In 4 months Baity, Salvadore Oxford, NP  [facility], [facility]

## 2022-09-19 ENCOUNTER — Encounter: Payer: Self-pay | Admitting: Oncology

## 2022-09-19 ENCOUNTER — Inpatient Hospital Stay: Payer: Commercial Managed Care - PPO | Attending: Oncology

## 2022-09-19 ENCOUNTER — Inpatient Hospital Stay: Payer: Commercial Managed Care - PPO | Admitting: Oncology

## 2022-09-19 VITALS — BP 166/77 | HR 67 | Temp 97.6°F | Wt 263.5 lb

## 2022-09-19 DIAGNOSIS — Z79899 Other long term (current) drug therapy: Secondary | ICD-10-CM | POA: Diagnosis not present

## 2022-09-19 DIAGNOSIS — Z5986 Financial insecurity: Secondary | ICD-10-CM | POA: Insufficient documentation

## 2022-09-19 DIAGNOSIS — Z833 Family history of diabetes mellitus: Secondary | ICD-10-CM | POA: Insufficient documentation

## 2022-09-19 DIAGNOSIS — D0511 Intraductal carcinoma in situ of right breast: Secondary | ICD-10-CM

## 2022-09-19 DIAGNOSIS — Z170 Estrogen receptor positive status [ER+]: Secondary | ICD-10-CM | POA: Insufficient documentation

## 2022-09-19 DIAGNOSIS — Z8249 Family history of ischemic heart disease and other diseases of the circulatory system: Secondary | ICD-10-CM | POA: Diagnosis not present

## 2022-09-19 DIAGNOSIS — E119 Type 2 diabetes mellitus without complications: Secondary | ICD-10-CM | POA: Insufficient documentation

## 2022-09-19 DIAGNOSIS — Z510 Encounter for antineoplastic radiation therapy: Secondary | ICD-10-CM | POA: Diagnosis present

## 2022-09-19 DIAGNOSIS — Z801 Family history of malignant neoplasm of trachea, bronchus and lung: Secondary | ICD-10-CM | POA: Insufficient documentation

## 2022-09-19 DIAGNOSIS — Z800 Family history of malignant neoplasm of digestive organs: Secondary | ICD-10-CM | POA: Insufficient documentation

## 2022-09-19 DIAGNOSIS — Z923 Personal history of irradiation: Secondary | ICD-10-CM | POA: Diagnosis not present

## 2022-09-19 DIAGNOSIS — Z79810 Long term (current) use of selective estrogen receptor modulators (SERMs): Secondary | ICD-10-CM | POA: Diagnosis not present

## 2022-09-19 DIAGNOSIS — Z881 Allergy status to other antibiotic agents status: Secondary | ICD-10-CM | POA: Insufficient documentation

## 2022-09-19 DIAGNOSIS — D0591 Unspecified type of carcinoma in situ of right breast: Secondary | ICD-10-CM

## 2022-09-19 DIAGNOSIS — Z8042 Family history of malignant neoplasm of prostate: Secondary | ICD-10-CM | POA: Diagnosis not present

## 2022-09-19 LAB — CBC WITH DIFFERENTIAL (CANCER CENTER ONLY)
Abs Immature Granulocytes: 0.01 10*3/uL (ref 0.00–0.07)
Basophils Absolute: 0 10*3/uL (ref 0.0–0.1)
Basophils Relative: 1 %
Eosinophils Absolute: 0.1 10*3/uL (ref 0.0–0.5)
Eosinophils Relative: 4 %
HCT: 35.6 % — ABNORMAL LOW (ref 36.0–46.0)
Hemoglobin: 12.4 g/dL (ref 12.0–15.0)
Immature Granulocytes: 0 %
Lymphocytes Relative: 37 %
Lymphs Abs: 1.5 10*3/uL (ref 0.7–4.0)
MCH: 29 pg (ref 26.0–34.0)
MCHC: 34.8 g/dL (ref 30.0–36.0)
MCV: 83.2 fL (ref 80.0–100.0)
Monocytes Absolute: 0.3 10*3/uL (ref 0.1–1.0)
Monocytes Relative: 6 %
Neutro Abs: 2.1 10*3/uL (ref 1.7–7.7)
Neutrophils Relative %: 52 %
Platelet Count: 277 10*3/uL (ref 150–400)
RBC: 4.28 MIL/uL (ref 3.87–5.11)
RDW: 13.2 % (ref 11.5–15.5)
WBC Count: 4 10*3/uL (ref 4.0–10.5)
nRBC: 0 % (ref 0.0–0.2)

## 2022-09-19 LAB — CMP (CANCER CENTER ONLY)
ALT: 15 U/L (ref 0–44)
AST: 16 U/L (ref 15–41)
Albumin: 4 g/dL (ref 3.5–5.0)
Alkaline Phosphatase: 59 U/L (ref 38–126)
Anion gap: 7 (ref 5–15)
BUN: 14 mg/dL (ref 6–20)
CO2: 27 mmol/L (ref 22–32)
Calcium: 9 mg/dL (ref 8.9–10.3)
Chloride: 102 mmol/L (ref 98–111)
Creatinine: 0.73 mg/dL (ref 0.44–1.00)
GFR, Estimated: >60 mL/min (ref >60)
Glucose, Bld: 134 mg/dL — ABNORMAL HIGH (ref 70–99)
Potassium: 3.8 mmol/L (ref 3.5–5.1)
Sodium: 136 mmol/L (ref 135–145)
Total Bilirubin: 0.4 mg/dL (ref 0.3–1.2)
Total Protein: 7.6 g/dL (ref 6.5–8.1)

## 2022-09-19 MED ORDER — TAMOXIFEN CITRATE 20 MG PO TABS: 20.0000 mg | ORAL_TABLET | Freq: Every day | ORAL | 1 refills | Status: DC

## 2022-09-19 NOTE — Progress Notes (Signed)
Hematology/Oncology Consult Note [phone] [phone]     CHIEF COMPLAINTS/PURPOSE OF CONSULTATION:  Right breast DCIS  ASSESSMENT & PLAN:   Cancer Staging  DCIS (ductal carcinoma in situ) of breast Staging form: Breast, AJCC 8th Edition - Clinical stage from 02/05/2022: Stage 0 (cTis (DCIS), cN0, cM0, ER: Not Assessed, PR: Not Assessed, HER2: Not Assessed) - Signed by Rickard Patience, MD on 03/22/2022 - Pathologic stage from 03/01/2022: pTis (DCIS), pN0, G2, ER+, PR: Not Assessed, HER2: Not Assessed - Signed by Rickard Patience, MD on 03/22/2022   DCIS (ductal carcinoma in situ) of breast Final pathology was discussed with patient.  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 Labs are reviewed and discussed with patient. She tolerates Tamoxifen 20 mg daily, I recommend patient to continue. She is on Aspirin 81mg  daily. Obtain annual diagnostic mammogram in November 2024.  Long-term current use of tamoxifen Recommend patient to have gynecology evaluation annually.  Orders Placed This Encounter  Procedures   MM 3D DIAGNOSTIC MAMMOGRAM BILATERAL BREAST    Standing Status:   Future    Standing Expiration Date:   09/19/2023    Order Specific Question:   Reason for Exam (SYMPTOM  OR DIAGNOSIS REQUIRED)    Answer:   Breast Cancer    Order Specific Question:   Is the patient pregnant?    Answer:   No    Order Specific Question:   Preferred imaging location?    Answer:   [facility]   [place] LIMITED ULTRASOUND INCLUDING AXILLA RIGHT BREAST    Standing Status:   Future    Standing Expiration Date:   09/19/2023    Order Specific Question:   Reason for Exam (SYMPTOM  OR DIAGNOSIS REQUIRED)    Answer:   Breast cancer    Order Specific Question:   Preferred imaging location?    Answer:   [facility]   [place] LIMITED ULTRASOUND INCLUDING AXILLA LEFT BREAST     Standing Status:   Future    Standing Expiration Date:   09/19/2023     Order Specific Question:   Reason for Exam (SYMPTOM  OR DIAGNOSIS REQUIRED)    Answer:   Breast Cancer    Order Specific Question:   Preferred imaging location?    Answer:   [facility]   CBC with Differential (Cancer Center Only)    Standing Status:   Future    Standing Expiration Date:   09/19/2023   CMP (Cancer Center only)    Standing Status:   Future    Standing Expiration Date:   09/19/2023    Follow up in 3 months.  All questions were answered. The patient knows to call the clinic with any problems, questions or concerns.  Rickard Patience, MD, PhD [facility] Hematology Oncology 09/19/2022    HISTORY OF PRESENTING ILLNESS:  Veronica Chandler 52 y.o. female presents to establish care for DCIS of right breast I have reviewed her chart and materials related to her cancer extensively and collaborated history with the patient. Summary of oncologic history is as follows: Oncology History  DCIS (ductal carcinoma in situ) of breast  12/07/2021 Mammogram   Bilateral screen mammogram showed  In the right breast, calcifications warrant further evaluation with magnified views. In the left breast, no findings suspicious for malignancy   01/11/2022 Imaging   Unilateral right diagnostic mammogram showed . A 17 mm group of calcifications in the upper outer RIGHT breast at middle depth are  indeterminate. Recommend stereotactic guided biopsy for definitive characterization.    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  Pathology showed high grade DCIS, comedo type. Calcifications associated with DCIS.    Menarche at 52 yo Age at first child born 52 yo No previous OCP use No previous chest radiation, or breast biopsy.  Family history positive for father with prostate cancer, paternal aunt with lung cancer.    02/05/2022 Cancer Staging   Staging form: Breast, AJCC 8th Edition - Clinical stage from  02/05/2022: Stage 0 (cTis (DCIS), cN0, cM0, ER: Not Assessed, PR: Not Assessed, HER2: Not Assessed) - Signed by Rickard Patience, MD on 03/22/2022 Stage prefix: Initial diagnosis Nuclear grade: G3   02/19/2022 Surgery   S/p right lumpecotmy + SLNB  Pathology showed DCIS, intermediate grade, background benign mammary parenchyma with fibrocystic and apocrine changes and fibroadenomatoid changes.  Negative for invasive carcinoma. 2 sentinel lymph nodes were harvested and both were negative for malignancy.   Estrogen receptor positive -90%    03/01/2022 Cancer Staging   Staging form: Breast, AJCC 8th Edition - Pathologic stage from 03/01/2022: pTis (DCIS), pN0, G2, ER+, PR: Not Assessed, HER2: Not Assessed - Signed by Rickard Patience, MD on 03/22/2022 Stage prefix: Initial diagnosis Nuclear grade: G2 Histologic grading system: 3 grade system   04/05/2022 - 05/31/2022 Radiation Therapy   Adjuvant breast radiation.    07/27/2022 -  Anti-estrogen oral therapy   Started on tamoxifen 20 mg daily.    Patient is status post lumpectomy.  She presents to discuss pathology results and future management plan.  Denies no new complaints.  INTERVAL HISTORY Shanterica Oprah Camarena is a 52 y.o. female who has above history reviewed by me today presents for follow up visit for right breast DCIS S/p Radiation.  She started on tamoxifen 20 mg daily since May 2024.  She tolerates it.  Manageable side effects.  MEDICAL HISTORY:  Past Medical History:  Diagnosis Date   Breast cancer in situ    ER+; DCIS right breast   Medical history non-contributory    Pre-diabetes    Type 2 diabetes mellitus (HCC)     SURGICAL HISTORY: Past Surgical History:  Procedure Laterality Date   BREAST BIOPSY Right 02/01/2022   stereo bx, calcs, "X" clip-DCIS   BREAST BIOPSY Right 02/01/2022   MM RT BREAST BX W LOC DEV 1ST LESION IMAGE BX SPEC STEREO GUIDE 02/01/2022 [facility]   BREAST BIOPSY Right 02/14/2022   MM RT RADIO  FREQUENCY TAG LOC MAMMO GUIDE 02/14/2022 [facility]   COLONOSCOPY WITH PROPOFOL N/A 06/16/2020   Procedure: COLONOSCOPY WITH PROPOFOL;  Surgeon: Wyline Mood, MD;  Location: [facility];  Service: Gastroenterology;  Laterality: N/A;   NO PAST SURGERIES     PART MASTECTOMY,RADIO FREQUENCY LOCALIZER,AXILLARY SENTINEL NODE BIOPSY Right 03/01/2022   Procedure: PART MASTECTOMY,RADIO FREQUENCY LOCALIZER,AXILLARY SENTINEL NODE BIOPSY;  Surgeon: Sung Amabile, DO;  Location: [facility] ORS;  Service: General;  Laterality: Right;    SOCIAL HISTORY: Social History   Socioeconomic History   Marital status: Divorced    Spouse name: Not on file   Number of children: Not on file   Years of education: Not on file   Highest education level: Professional school degree (e.g., MD, DDS, DVM, JD)  Occupational History   Not on file  Tobacco Use   Smoking status: Never   Smokeless tobacco: Never  Vaping Use   Vaping status:  Never Used  Substance and Sexual Activity   Alcohol use: Yes    Comment: social   Drug use: Yes    Types: Marijuana    Comment: occasional   Sexual activity: Not on file  Other Topics Concern   Not on file  Social History Narrative   Not on file   Social Determinants of Health   Financial Resource Strain: Medium Risk (07/03/2022)   Overall Financial Resource Strain (CARDIA)    Difficulty of Paying Living Expenses: Somewhat hard  Food Insecurity: Food Insecurity Present (07/03/2022)   Hunger Vital Sign    Worried About Running Out of Food in the Last Year: Sometimes true    Ran Out of Food in the Last Year: Sometimes true  Transportation Needs: No Transportation Needs (07/03/2022)   PRAPARE - Administrator, Civil Service (Medical): No    Lack of Transportation (Non-Medical): No  Physical Activity: Sufficiently Active (07/03/2022)   Exercise Vital Sign    Days of Exercise per Week: 5 days    Minutes of Exercise per Session: 30 min  Stress: No Stress Concern  Present (07/03/2022)   [employer] of Occupational Health - Occupational Stress Questionnaire    Feeling of Stress : Not at all  Social Connections: Moderately Isolated (07/03/2022)   Social Connection and Isolation Panel [NHANES]    Frequency of Communication with Friends and Family: More than three times a week    Frequency of Social Gatherings with Friends and Family: Once a week    Attends Religious Services: 1 to 4 times per year    Active Member of [employer] or Organizations: No    Attends Engineer, structural: Not on file    Marital Status: Divorced  Intimate Partner Violence: Not At Risk (04/26/2022)   Humiliation, Afraid, Rape, and Kick questionnaire    Fear of Current or Ex-Partner: No    Emotionally Abused: No    Physically Abused: No    Sexually Abused: No    FAMILY HISTORY: Family History  Problem Relation Age of Onset   Hypertension Mother    Diabetes Father    Prostate cancer Father    Healthy Sister    Healthy Brother    Diabetes Paternal Grandmother    Glaucoma Paternal Grandmother    Lung cancer Maternal Aunt    Pancreatic cancer Maternal Aunt    Lung cancer Paternal Aunt    Breast cancer Neg Hx    Colon cancer Neg Hx    Ovarian cancer Neg Hx     ALLERGIES:  is allergic to flagyl [metronidazole].  MEDICATIONS:  Current Outpatient Medications  Medication Sig Dispense Refill   aspirin EC 81 MG tablet Take 1 tablet (81 mg total) by mouth daily. Swallow whole. 30 tablet 12   Continuous Glucose Sensor (FREESTYLE LIBRE 3 SENSOR) MISC PLACE 1 SENSOR ON THE SKIN EVERY 14 DAYS. USE TO CHECK GLUCOSE CONTINUOUSLY 2 each 2   fluticasone (FLONASE) 50 MCG/ACT nasal spray SPRAY 2 SPRAYS INTO EACH NOSTRIL EVERY DAY 16 mL 2   insulin aspart (NOVOLOG) 100 UNIT/ML FlexPen Inject 5 Units into the skin 3 (three) times daily with meals. 15 mL 11   insulin glargine (LANTUS) 100 UNIT/ML Solostar Pen Inject 20 Units into the skin daily. 15 mL 11   levocetirizine  (XYZAL) 5 MG tablet TAKE 1 TABLET BY MOUTH EVERY DAY IN THE EVENING 90 tablet 0   Multiple Vitamin (MULTIVITAMIN WITH MINERALS) TABS tablet Take 1 tablet by mouth  daily.     Syringe/Needle, Disp, (SYRINGE 3CC/18GX1-1/2") 18G X 1-1/2" 3 ML MISC 1 Application by Does not apply route 3 (three) times daily. 30 each 3   atorvastatin (LIPITOR) 40 MG tablet Take 1 tablet (40 mg total) by mouth every evening. (Patient not taking: Reported on 07/11/2022) 30 tablet 1   tamoxifen (NOLVADEX) 20 MG tablet Take 1 tablet (20 mg total) by mouth daily. 90 tablet 1   No current facility-administered medications for this visit.    Review of Systems  Constitutional:  Negative for appetite change, chills, fatigue and fever.  HENT:   Negative for hearing loss and voice change.   Eyes:  Negative for eye problems.  Respiratory:  Negative for chest tightness and cough.   Cardiovascular:  Negative for chest pain.  Gastrointestinal:  Negative for abdominal distention, abdominal pain and blood in stool.  Endocrine: Negative for hot flashes.  Genitourinary:  Negative for difficulty urinating and frequency.   Musculoskeletal:  Negative for arthralgias.  Skin:  Negative for itching and rash.  Neurological:  Negative for extremity weakness.  Hematological:  Negative for adenopathy.  Psychiatric/Behavioral:  Negative for confusion.      PHYSICAL EXAMINATION: ECOG PERFORMANCE STATUS: 0 - Asymptomatic  Vitals:   09/19/22 1031  BP: (!) 166/77  Pulse: 67  Temp: 97.6 F (36.4 C)  SpO2: 100%   Filed Weights   09/19/22 1031  Weight: 263 lb 8 oz (119.5 kg)    Physical Exam Constitutional:      General: She is not in acute distress.    Appearance: She is not diaphoretic.  HENT:     Head: Normocephalic and atraumatic.     Nose: Nose normal.     Mouth/Throat:     Pharynx: No oropharyngeal exudate.  Eyes:     General: No scleral icterus.    Pupils: Pupils are equal, round, and reactive to light.   Cardiovascular:     Rate and Rhythm: Normal rate and regular rhythm.     Heart sounds: No murmur heard. Pulmonary:     Effort: Pulmonary effort is normal. No respiratory distress.     Breath sounds: No rales.  Chest:     Chest wall: No tenderness.  Abdominal:     General: There is no distension.     Palpations: Abdomen is soft.     Tenderness: There is no abdominal tenderness.  Musculoskeletal:        General: Normal range of motion.     Cervical back: Normal range of motion and neck supple.  Skin:    General: Skin is warm and dry.     Findings: No erythema.  Neurological:     Mental Status: She is alert and oriented to person, place, and time.     Cranial Nerves: No cranial nerve deficit.     Motor: No abnormal muscle tone.     Coordination: Coordination normal.  Psychiatric:        Mood and Affect: Mood and affect normal.      LABORATORY DATA:  I have reviewed the data as listed    Latest Ref Rng & Units 09/19/2022   10:24 AM 05/24/2022   10:41 AM 04/29/2022    6:41 AM  CBC  WBC 4.0 - 10.5 K/uL 4.0  6.0  4.2   Hemoglobin 12.0 - 15.0 g/dL 78.2  95.6  21.3   Hematocrit 36.0 - 46.0 % 35.6  35.1  32.5   Platelets 150 - 400 K/uL 277  273  153       Latest Ref Rng & Units 09/19/2022   10:24 AM 05/07/2022    2:08 PM 04/29/2022    6:41 AM  CMP  Glucose 70 - 99 mg/dL 161  096  045   BUN 6 - 20 mg/dL 14  10  11    Creatinine 0.44 - 1.00 mg/dL 4.09  8.11  9.14   Sodium 135 - 145 mmol/L 136  140  140   Potassium 3.5 - 5.1 mmol/L 3.8  4.3  4.0   Chloride 98 - 111 mmol/L 102  104  110   CO2 22 - 32 mmol/L 27  29  25    Calcium 8.9 - 10.3 mg/dL 9.0  9.0  8.0   Total Protein 6.5 - 8.1 g/dL 7.6  6.5    Total Bilirubin 0.3 - 1.2 mg/dL 0.4  0.3    Alkaline Phos 38 - 126 U/L 59     AST 15 - 41 U/L 16  17    ALT 0 - 44 U/L 15  28       RADIOGRAPHIC STUDIES: I have personally reviewed the radiological images as listed and agreed with the findings in the report. No results  found.

## 2022-09-19 NOTE — Assessment & Plan Note (Addendum)
Final pathology was discussed with patient.  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 Labs are reviewed and discussed with patient. She tolerates Tamoxifen 20 mg daily, I recommend patient to continue. She is on Aspirin 81mg  daily. Obtain annual diagnostic mammogram in November 2024.

## 2022-09-19 NOTE — Assessment & Plan Note (Signed)
Recommend patient to have gynecology evaluation annually.

## 2022-10-14 ENCOUNTER — Other Ambulatory Visit: Payer: Self-pay | Admitting: Internal Medicine

## 2022-10-16 NOTE — Telephone Encounter (Signed)
Requested Prescriptions  Pending Prescriptions Disp Refills   levocetirizine (XYZAL) 5 MG tablet [Pharmacy Med Name: LEVOCETIRIZINE 5 MG TABLET] 90 tablet 2    Sig: TAKE 1 TABLET BY MOUTH EVERY DAY IN THE EVENING     Ear, Nose, and Throat:  Antihistamines - levocetirizine dihydrochloride Passed - 10/14/2022  3:12 PM      Passed - Cr in normal range and within 360 days    Creatinine  Date Value Ref Range Status  09/19/2022 0.73 0.44 - 1.00 mg/dL Final   Creat  Date Value Ref Range Status  05/07/2022 0.76 0.50 - 1.03 mg/dL Final   Creatinine, Urine  Date Value Ref Range Status  07/27/2022 58 20 - 275 mg/dL Final         Passed - eGFR is 10 or above and within 360 days    GFR, Estimated  Date Value Ref Range Status  09/19/2022 >60 >60 mL/min Final    Comment:    (NOTE) Calculated using the CKD-EPI Creatinine Equation (2021)    GFR  Date Value Ref Range Status  05/05/2020 84.63 >60.00 mL/min Final    Comment:    Calculated using the CKD-EPI Creatinine Equation (2021)   eGFR  Date Value Ref Range Status  05/07/2022 94 > OR = 60 mL/min/1.73m2 Final         Passed - Valid encounter within last 12 months    Recent Outpatient Visits           3 months ago Encounter for general adult medical examination with abnormal findings   [place] [facility] [place], Salvadore Oxford, NP   5 months ago Diabetic ketoacidosis without coma associated with type 2 diabetes mellitus [facility])   [place] [facility] [place], Salvadore Oxford, NP   6 months ago Vaginal yeast infection   [place] [facility] [place], Oswaldo [place], [place]   10 months ago Screening for cervical cancer   [place] [facility] [place], Salvadore Oxford, NP   1 year ago Encounter for general adult medical examination with abnormal findings   [facility] [place], Salvadore Oxford, NP       Future Appointments             In 3 months Baity, Salvadore Oxford, NP [facility], [facility]

## 2022-11-07 ENCOUNTER — Ambulatory Visit: Payer: Self-pay | Admitting: *Deleted

## 2022-11-07 NOTE — Telephone Encounter (Signed)
Summary: +Covid, sinus drainage, chills   Sinus drainage, chills tested positive for covid monday 9/2         Chief Complaint: Covid Positive Symptoms: Sinus drainage, chills, mild headache Frequency: Symptoms started Sunday. Tested positive Monday Pertinent Negatives: Patient denies fever, sore throat, muscle aches,sinus pain,pressure.  Disposition: []ED /[]Urgent Care (no appt availability in office) / []Appointment(In office/virtual)/ [] [facility]/ []Home Care/ []Refused Recommended Disposition /[][place] [facility]/ [x] Follow-up with PCP Additional Notes: Home care advise provided,reviewed self isolation guidelines, pt verbalizes understanding. Pt questioning if PCP recommends Paxlovid as she is considered high risk. Please advise.  Reason for Disposition  [1] HIGH RISK patient (e.g., weak immune system, age > 64 years, obesity with BMI 30 or higher, pregnant, chronic lung disease or other chronic medical condition) AND [2] COVID symptoms (e.g., cough, fever)  (Exceptions: Already seen by PCP and no new or worsening symptoms.)  Answer Assessment - Initial Assessment Questions 1. COVID-19 DIAGNOSIS: "How do you know that you have COVID?" (e.g., positive lab test or self-test, diagnosed by doctor or NP/PA, symptoms after exposure).     Home test Monday 2. COVID-19 EXPOSURE: "Was there any known exposure to COVID before the symptoms began?" CDC Definition of close contact: within 6 feet (2 meters) for a total of 15 minutes or more over a 24-hour period.      Friend Positive 3. ONSET: "When did the COVID-19 symptoms start?"      Sunday 4. WORST SYMPTOM: "What is your worst symptom?" (e.g., cough, fever, shortness of breath, muscle aches)      5. COUGH: "Do you have a cough?" If Yes, ask: "How bad is the cough?"       No 6. FEVER: "Do you have a fever?" If Yes, ask: "What is your temperature, how was it measured, and when did it start?"     No 7. RESPIRATORY STATUS:  "Describe your breathing?" (e.g., normal; shortness of breath, wheezing, unable to speak)      No 8. BETTER-SAME-WORSE: "Are you getting better, staying the same or getting worse compared to yesterday?"  If getting worse, ask, "In what way?"      9. OTHER SYMPTOMS: "Do you have any other symptoms?"  (e.g., chills, fatigue, headache, loss of smell or taste, muscle pain, sore throat)     Nasal drainage,chills 10. HIGH RISK DISEASE: "Do you have any chronic medical problems?" (e.g., asthma, heart or lung disease, weak immune system, obesity, etc.)        11 . VACCINE: "Have you had the COVID-19 vaccine?" If Yes, ask: "Which one, how many shots, when did you get it?"       Yes,all 2020  Protocols used: Coronavirus (COVID-19) Diagnosed or Suspected-A-[facility]

## 2022-11-08 NOTE — Telephone Encounter (Signed)
Pt advised.  She states she is feeling much better.  She will call back if her symptoms worsening.  Thanks,   -Vernona Rieger

## 2022-11-08 NOTE — Telephone Encounter (Signed)
If her symptoms are mild, I would not recommend Paxlovid.  If this is something that she wants to consider, I can do a virtual visit with her today.  We can double book the 1120

## 2022-12-10 ENCOUNTER — Ambulatory Visit
Admission: RE | Admit: 2022-12-10 | Discharge: 2022-12-10 | Disposition: A | Discharge status: Home / Self Care | Payer: Commercial Managed Care - PPO | Source: Ambulatory Visit | Attending: Oncology

## 2022-12-10 ENCOUNTER — Ambulatory Visit
Admission: RE | Admit: 2022-12-10 | Discharge: 2022-12-10 | Disposition: A | Discharge status: Home / Self Care | Payer: Commercial Managed Care - PPO | Source: Ambulatory Visit | Attending: Oncology | Admitting: Oncology

## 2022-12-10 DIAGNOSIS — D0511 Intraductal carcinoma in situ of right breast: Secondary | ICD-10-CM | POA: Insufficient documentation

## 2022-12-25 ENCOUNTER — Other Ambulatory Visit: Payer: Self-pay | Admitting: Internal Medicine

## 2022-12-26 NOTE — Telephone Encounter (Signed)
Requested Prescriptions  Pending Prescriptions Disp Refills   fluticasone (FLONASE) 50 MCG/ACT nasal spray [Pharmacy Med Name: FLUTICASONE PROP 50 MCG SPRAY] 16 mL 2    Sig: SPRAY 2 SPRAYS INTO EACH NOSTRIL EVERY DAY     Ear, Nose, and Throat: Nasal Preparations - Corticosteroids Passed - 12/25/2022  2:36 AM      Passed - Valid encounter within last 12 months    Recent Outpatient Visits           5 months ago Encounter for general adult medical examination with abnormal findings   [place], [place] W, NP   7 months ago Diabetic ketoacidosis without coma associated with type 2 diabetes mellitus [facility])   [place] [facility] [place], Salvadore Oxford, NP   8 months ago Vaginal yeast infection   [place] [facility] [place], Oswaldo Con[place], [place]   1 year ago Screening for cervical cancer   [place] [facility] [place], Salvadore Oxford, NP   1 year ago Encounter for general adult medical examination with abnormal findings   [place] [facility] [place], Salvadore Oxford, NP       Future Appointments             In 3 weeks Sam[place] Si, Salvadore Oxford, NP [place] [facility]

## 2023-01-10 ENCOUNTER — Ambulatory Visit
Admission: RE | Admit: 2023-01-10 | Discharge: 2023-01-10 | Disposition: A | Discharge status: Home / Self Care | Payer: Commercial Managed Care - PPO | Source: Ambulatory Visit | Attending: Radiation Oncology | Admitting: Radiation Oncology

## 2023-01-10 VITALS — BP 163/94 | HR 73 | Temp 97.0°F | Resp 16 | Ht 61.0 in | Wt 253.0 lb

## 2023-01-10 DIAGNOSIS — Z79810 Long term (current) use of selective estrogen receptor modulators (SERMs): Secondary | ICD-10-CM | POA: Insufficient documentation

## 2023-01-10 DIAGNOSIS — D0511 Intraductal carcinoma in situ of right breast: Secondary | ICD-10-CM | POA: Diagnosis present

## 2023-01-10 DIAGNOSIS — Z923 Personal history of irradiation: Secondary | ICD-10-CM | POA: Diagnosis not present

## 2023-01-10 DIAGNOSIS — C50411 Malignant neoplasm of upper-outer quadrant of right female breast: Secondary | ICD-10-CM

## 2023-01-10 NOTE — Progress Notes (Signed)
Radiation Oncology Follow up Note  Name: Veronica Chandler   Date:   01/10/2023 MRN:  [id number] DOB: 03-Jan-1971    This 52 y.o. 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 She has been on tamoxifen without any reported problems. Recent mammograms were benign (BI-RADS 2). The patient reports no new or concerning symptoms. She notes that the color in the treated area has not fully returned, but otherwise, she reports feeling fine.  COMPLICATIONS OF TREATMENT: none  FOLLOW UP COMPLIANCE: keeps appointments   PHYSICAL EXAM:  BP (!) 163/94   Pulse 73   Temp (!) 97 F (36.1 C)   Resp 16   Ht 5\' 1"  (1.549 m)   Wt 253 lb (114.8 kg)   BMI 47.80 kg/m  Lungs are clear to A&P cardiac examination essentially unremarkable with regular rate and rhythm. No dominant mass or nodularity is noted in either breast in 2 positions examined. Incision is well-healed. No axillary or supraclavicular adenopathy is appreciated. Cosmetic result is excellent.  Well-developed well-nourished patient in NAD. HEENT reveals PERLA, EOMI, discs not visualized.  Oral cavity is clear. No oral mucosal lesions are identified. Neck is clear without evidence of cervical or supraclavicular adenopathy. Lungs are clear to A&P. Cardiac examination is essentially unremarkable with regular rate and rhythm without murmur rub or thrill. Abdomen is benign with no organomegaly or masses noted. Motor sensory and DTR levels are equal and symmetric in the upper and lower extremities. Cranial nerves II through XII are grossly intact. Proprioception is intact. No peripheral adenopathy or  edema is identified. No motor or sensory levels are noted. Crude visual fields are within normal range.  RADIOLOGY RESULTS: Mammogram: BI-RADS 2 benign (12/2022)  PLAN: ER Positive Ductal Carcinoma In Situ, Right Breast Status post whole breast radiation, currently on Tamoxifen. Recent mammogram was BI-RADS 2 (benign). Physical examination of the breasts and axillary lymph nodes was unremarkable. -Continue Tamoxifen as prescribed. -Follow-up in six months with repeat mammogram.    Veronica Miller, MD

## 2023-01-21 ENCOUNTER — Encounter: Payer: Self-pay | Admitting: Internal Medicine

## 2023-01-21 ENCOUNTER — Ambulatory Visit: Payer: Commercial Managed Care - PPO | Admitting: Internal Medicine

## 2023-01-21 VITALS — BP 124/82 | HR 79 | Ht 61.0 in | Wt 257.8 lb

## 2023-01-21 DIAGNOSIS — Z853 Personal history of malignant neoplasm of breast: Secondary | ICD-10-CM

## 2023-01-21 DIAGNOSIS — Z794 Long term (current) use of insulin: Secondary | ICD-10-CM

## 2023-01-21 DIAGNOSIS — E66813 Obesity, class 3: Secondary | ICD-10-CM

## 2023-01-21 DIAGNOSIS — E1165 Type 2 diabetes mellitus with hyperglycemia: Secondary | ICD-10-CM | POA: Diagnosis not present

## 2023-01-21 DIAGNOSIS — Z6841 Body Mass Index (BMI) 40.0 and over, adult: Secondary | ICD-10-CM

## 2023-01-21 NOTE — Patient Instructions (Signed)

## 2023-01-21 NOTE — Assessment & Plan Note (Signed)
A1c today Urine microalbumin has been checked within the last year Continue Lantus Encourage low-carb diet and exercise for weight loss Encourage routine eye exams Encourage routine foot exams Immunizations UTD

## 2023-01-21 NOTE — Progress Notes (Signed)
Subjective:    Patient ID: Franny Debaker, female    DOB: 05-16-1970, 52 y.o.   MRN: [id number]  HPI  Patient presents to clinic today for follow-up of chronic conditions.  DM2: Her last A1c was 6.6%, 07/2022.  She is taking lantus as prescribed.  Her sugars range 128-251.  She has been trying to consume a low-fat diet.  Her last eye exam was 05/2022.  She checks her feet routinely.  Flu 01/2023.  Pneumovax 05/2020.  COVID Moderna x3.  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  She is taking tamoxifen as prescribed.  She follows with oncology.  Review of Systems     Past Medical History:  Diagnosis Date   Breast cancer in situ    ER+; DCIS right breast   Medical history non-contributory    Pre-diabetes    Type 2 diabetes mellitus (HCC)     Current Outpatient Medications  Medication Sig Dispense Refill   aspirin EC 81 MG tablet Take 1 tablet (81 mg total) by mouth daily. Swallow whole. 30 tablet 12   atorvastatin (LIPITOR) 40 MG tablet Take 1 tablet (40 mg total) by mouth every evening. (Patient not taking: Reported on 07/11/2022) 30 tablet 1   Continuous Glucose Sensor (FREESTYLE LIBRE 3 SENSOR) MISC PLACE 1 SENSOR ON THE SKIN EVERY 14 DAYS. USE TO CHECK GLUCOSE CONTINUOUSLY 2 each 2   fluticasone (FLONASE) 50 MCG/ACT nasal spray SPRAY 2 SPRAYS INTO EACH NOSTRIL EVERY DAY 16 mL 2   insulin aspart (NOVOLOG) 100 UNIT/ML FlexPen Inject 5 Units into the skin 3 (three) times daily with meals. 15 mL 11   insulin glargine (LANTUS) 100 UNIT/ML Solostar Pen Inject 20 Units into the skin daily. 15 mL 11   levocetirizine (XYZAL) 5 MG tablet TAKE 1 TABLET BY MOUTH EVERY DAY IN THE EVENING 90 tablet 2   Multiple Vitamin (MULTIVITAMIN WITH MINERALS) TABS tablet Take 1 tablet by mouth daily.     Syringe/Needle, Disp, (SYRINGE 3CC/18GX1-1/2") 18G X 1-1/2" 3 ML MISC 1 Application by Does not apply route 3 (three) times daily. 30 each 3   tamoxifen  (NOLVADEX) 20 MG tablet Take 1 tablet (20 mg total) by mouth daily. 90 tablet 1   No current facility-administered medications for this visit.    Allergies  Allergen Reactions   Flagyl [Metronidazole] Other (See Comments)    Causes blurred vision, dizziness, fainting.     Family History  Problem Relation Age of Onset   Hypertension Mother    Diabetes Father    Prostate cancer Father    Healthy Sister    Healthy Brother    Diabetes Paternal Grandmother    Glaucoma Paternal Grandmother    Lung cancer Maternal Aunt    Pancreatic cancer Maternal Aunt    Lung cancer Paternal Aunt    Breast cancer Neg Hx    Colon cancer Neg Hx    Ovarian cancer Neg Hx     Social History   Socioeconomic History   Marital status: Divorced    Spouse name: Not on file   Number of children: Not on file   Years of education: Not on file   Highest education level: Some college, no degree  Occupational History   Not on file  Tobacco Use   Smoking status: Never   Smokeless tobacco: Never  Vaping Use   Vaping status: Never Used  Substance and Sexual Activity   Alcohol use: Yes  Comment: social   Drug use: Yes    Types: Marijuana    Comment: occasional   Sexual activity: Not on file  Other Topics Concern   Not on file  Social History Narrative   Not on file   Social Determinants of Health   Financial Resource Strain: Low Risk  (01/17/2023)   Overall Financial Resource Strain (CARDIA)    Difficulty of Paying Living Expenses: Not hard at all  Food Insecurity: Food Insecurity Present (01/17/2023)   Hunger Vital Sign    Worried About Running Out of Food in the Last Year: Sometimes true    Ran Out of Food in the Last Year: Sometimes true  Transportation Needs: No Transportation Needs (01/17/2023)   PRAPARE - Administrator, Civil Service (Medical): No    Lack of Transportation (Non-Medical): No  Physical Activity: Inactive (01/17/2023)   Exercise Vital Sign    Days of  Exercise per Week: 0 days    Minutes of Exercise per Session: 30 min  Stress: No Stress Concern Present (01/17/2023)   [employer] of Occupational Health - Occupational Stress Questionnaire    Feeling of Stress : Not at all  Social Connections: Moderately Isolated (01/17/2023)   Social Connection and Isolation Panel [NHANES]    Frequency of Communication with Friends and Family: More than three times a week    Frequency of Social Gatherings with Friends and Family: More than three times a week    Attends Religious Services: 1 to 4 times per year    Active Member of [employer] or Organizations: No    Attends Engineer, structural: Not on file    Marital Status: Divorced  Intimate Partner Violence: Not At Risk (04/26/2022)   Humiliation, Afraid, Rape, and Kick questionnaire    Fear of Current or Ex-Partner: No    Emotionally Abused: No    Physically Abused: No    Sexually Abused: No     Constitutional: Denies fever, malaise, fatigue, headache or abrupt weight changes.  Respiratory: Denies difficulty breathing, shortness of breath, cough or sputum production.   Cardiovascular: Denies chest pain, chest tightness, palpitations or swelling in the hands or feet.  Skin: Denies redness, rashes, lesions or ulcercations.  Neurological: Pt reports hot flashes and night sweats. Denies dizziness, difficulty with memory, difficulty with speech or problems with balance and coordination.   No other specific complaints in a complete review of systems (except as listed in HPI above).  Objective:   Physical Exam  BP 124/82   Pulse 79   Ht 5\' 1"  (1.549 m)   Wt 257 lb 12.8 oz (116.9 kg)   SpO2 100%   BMI 48.71 kg/m    Wt Readings from Last 3 Encounters:  01/10/23 253 lb (114.8 kg)  09/19/22 263 lb 8 oz (119.5 kg)  07/11/22 267 lb (121.1 kg)    General: Appears her stated age, obese, in NAD. Skin: Warm, dry and intact. No ulcerations noted. HEENT: Head: normal shape and size; Eyes:  sclera white and EOMs intact;  Cardiovascular: Normal rate and rhythm. S1,S2 noted.  No murmur, rubs or gallops noted.  Pulmonary/Chest: Normal effort and positive vesicular breath sounds. No respiratory distress. No wheezes, rales or ronchi noted.  Musculoskeletal:  No difficulty with gait.  Neurological: Alert and oriented.    BMET    Component Value Date/Time   NA 136 09/19/2022 1024   K 3.8 09/19/2022 1024   CL 102 09/19/2022 1024   CO2 27 09/19/2022  1024   GLUCOSE 134 (H) 09/19/2022 1024   BUN 14 09/19/2022 1024   CREATININE 0.73 09/19/2022 1024   CREATININE 0.76 05/07/2022 1408   CALCIUM 9.0 09/19/2022 1024   GFRNONAA >60 09/19/2022 1024    Lipid Panel     Component Value Date/Time   CHOL 162 07/27/2022 0933   TRIG 57 07/27/2022 0933   HDL 59 07/27/2022 0933   CHOLHDL 2.7 07/27/2022 0933   VLDL 17 04/27/2022 0351   LDLCALC 89 07/27/2022 0933    CBC    Component Value Date/Time   WBC 4.0 09/19/2022 1024   WBC 4.2 04/29/2022 0641   RBC 4.28 09/19/2022 1024   HGB 12.4 09/19/2022 1024   HCT 35.6 (L) 09/19/2022 1024   PLT 277 09/19/2022 1024   MCV 83.2 09/19/2022 1024   MCH 29.0 09/19/2022 1024   MCHC 34.8 09/19/2022 1024   RDW 13.2 09/19/2022 1024   LYMPHSABS 1.5 09/19/2022 1024   MONOABS 0.3 09/19/2022 1024   EOSABS 0.1 09/19/2022 1024   BASOSABS 0.0 09/19/2022 1024    Hgb A1C Lab Results  Component Value Date   HGBA1C 6.6 (H) 07/27/2022           Assessment & Plan:   RTC in 6 months for your annual exam Nicki Reaper, NP

## 2023-01-21 NOTE — Assessment & Plan Note (Signed)
Encouraged diet and exercise for weight loss ?

## 2023-01-21 NOTE — Assessment & Plan Note (Signed)
In remission Continue tamoxifen per oncology

## 2023-01-22 LAB — COMPLETE METABOLIC PANEL WITH GFR
AG Ratio: 1.2 (calc) (ref 1.0–2.5)
ALT: 13 U/L (ref 6–29)
AST: 14 U/L (ref 10–35)
Albumin: 4.2 g/dL (ref 3.6–5.1)
Alkaline phosphatase (APISO): 64 U/L (ref 37–153)
BUN: 14 mg/dL (ref 7–25)
CO2: 32 mmol/L (ref 20–32)
Calcium: 9.7 mg/dL (ref 8.6–10.4)
Chloride: 102 mmol/L (ref 98–110)
Creat: 0.76 mg/dL (ref 0.50–1.03)
Globulin: 3.4 g/dL (ref 1.9–3.7)
Glucose, Bld: 157 mg/dL — ABNORMAL HIGH (ref 65–99)
Potassium: 4.1 mmol/L (ref 3.5–5.3)
Sodium: 141 mmol/L (ref 135–146)
Total Bilirubin: 0.3 mg/dL (ref 0.2–1.2)
Total Protein: 7.6 g/dL (ref 6.1–8.1)
eGFR: 94 mL/min/{1.73_m2} (ref >=60)

## 2023-01-22 LAB — CBC
HCT: 37.1 % (ref 35.0–45.0)
Hemoglobin: 12.1 g/dL (ref 11.7–15.5)
MCH: 29.2 pg (ref 27.0–33.0)
MCHC: 32.6 g/dL (ref 32.0–36.0)
MCV: 89.6 fL (ref 80.0–100.0)
MPV: 9.1 fL (ref 7.5–12.5)
Platelets: 314 10*3/uL (ref 140–400)
RBC: 4.14 10*6/uL (ref 3.80–5.10)
RDW: 13.1 % (ref 11.0–15.0)
WBC: 4.3 10*3/uL (ref 3.8–10.8)

## 2023-01-22 LAB — LIPID PANEL
Cholesterol: 165 mg/dL (ref <200)
HDL: 63 mg/dL (ref >=50)
LDL Cholesterol (Calc): 83 mg/dL
Non-HDL Cholesterol (Calc): 102 mg/dL (ref <130)
Total CHOL/HDL Ratio: 2.6 (calc) (ref <5.0)
Triglycerides: 94 mg/dL (ref <150)

## 2023-01-22 LAB — HEMOGLOBIN A1C
Hgb A1c MFr Bld: 8.5 %{Hb} — ABNORMAL HIGH (ref <5.7)
Mean Plasma Glucose: 197 mg/dL
eAG (mmol/L): 10.9 mmol/L

## 2023-02-04 ENCOUNTER — Other Ambulatory Visit: Payer: Self-pay | Admitting: Surgery

## 2023-02-04 DIAGNOSIS — Z853 Personal history of malignant neoplasm of breast: Secondary | ICD-10-CM

## 2023-03-12 ENCOUNTER — Telehealth: Payer: Self-pay | Admitting: Internal Medicine

## 2023-03-12 NOTE — Telephone Encounter (Signed)
 Pt called in states, Freestyle libre 3 isnt covered under her insurance and she needs it switched to [facility] that will be covered. She will cancel the order for the freestyle libre 3

## 2023-03-13 MED ORDER — DEXCOM G7 SENSOR MISC: 1.0000 | 1 refills | Status: DC

## 2023-03-13 NOTE — Telephone Encounter (Signed)
Dexcom sent to pharmacy

## 2023-03-13 NOTE — Addendum Note (Signed)
 Addended by: Lorre Munroe on: 03/13/2023 07:45 AM   Modules accepted: Orders

## 2023-03-17 ENCOUNTER — Other Ambulatory Visit: Payer: Self-pay | Admitting: Neurosurgery

## 2023-03-20 ENCOUNTER — Inpatient Hospital Stay: Payer: Commercial Managed Care - PPO | Attending: Oncology

## 2023-03-20 ENCOUNTER — Inpatient Hospital Stay (HOSPITAL_BASED_OUTPATIENT_CLINIC_OR_DEPARTMENT_OTHER): Payer: Commercial Managed Care - PPO | Admitting: Oncology

## 2023-03-20 ENCOUNTER — Encounter: Payer: Self-pay | Admitting: Oncology

## 2023-03-20 VITALS — BP 144/81 | HR 71 | Temp 96.5°F | Resp 18 | Wt 257.7 lb

## 2023-03-20 DIAGNOSIS — Z79899 Other long term (current) drug therapy: Secondary | ICD-10-CM | POA: Insufficient documentation

## 2023-03-20 DIAGNOSIS — Z8249 Family history of ischemic heart disease and other diseases of the circulatory system: Secondary | ICD-10-CM | POA: Insufficient documentation

## 2023-03-20 DIAGNOSIS — Z79810 Long term (current) use of selective estrogen receptor modulators (SERMs): Secondary | ICD-10-CM

## 2023-03-20 DIAGNOSIS — Z833 Family history of diabetes mellitus: Secondary | ICD-10-CM | POA: Diagnosis not present

## 2023-03-20 DIAGNOSIS — Z881 Allergy status to other antibiotic agents status: Secondary | ICD-10-CM | POA: Insufficient documentation

## 2023-03-20 DIAGNOSIS — D0511 Intraductal carcinoma in situ of right breast: Secondary | ICD-10-CM | POA: Diagnosis present

## 2023-03-20 DIAGNOSIS — Z853 Personal history of malignant neoplasm of breast: Secondary | ICD-10-CM | POA: Diagnosis not present

## 2023-03-20 DIAGNOSIS — Z83511 Family history of glaucoma: Secondary | ICD-10-CM | POA: Diagnosis not present

## 2023-03-20 DIAGNOSIS — Z8042 Family history of malignant neoplasm of prostate: Secondary | ICD-10-CM | POA: Insufficient documentation

## 2023-03-20 DIAGNOSIS — Z800 Family history of malignant neoplasm of digestive organs: Secondary | ICD-10-CM | POA: Insufficient documentation

## 2023-03-20 DIAGNOSIS — Z923 Personal history of irradiation: Secondary | ICD-10-CM | POA: Diagnosis not present

## 2023-03-20 DIAGNOSIS — Z170 Estrogen receptor positive status [ER+]: Secondary | ICD-10-CM | POA: Insufficient documentation

## 2023-03-20 DIAGNOSIS — Z801 Family history of malignant neoplasm of trachea, bronchus and lung: Secondary | ICD-10-CM | POA: Insufficient documentation

## 2023-03-20 LAB — CBC WITH DIFFERENTIAL (CANCER CENTER ONLY)
Abs Immature Granulocytes: 0.01 10*3/uL (ref 0.00–0.07)
Basophils Absolute: 0 10*3/uL (ref 0.0–0.1)
Basophils Relative: 1 %
Eosinophils Absolute: 0.1 10*3/uL (ref 0.0–0.5)
Eosinophils Relative: 3 %
HCT: 35.4 % — ABNORMAL LOW (ref 36.0–46.0)
Hemoglobin: 12.3 g/dL (ref 12.0–15.0)
Immature Granulocytes: 0 %
Lymphocytes Relative: 38 %
Lymphs Abs: 1.7 10*3/uL (ref 0.7–4.0)
MCH: 29.3 pg (ref 26.0–34.0)
MCHC: 34.7 g/dL (ref 30.0–36.0)
MCV: 84.3 fL (ref 80.0–100.0)
Monocytes Absolute: 0.3 10*3/uL (ref 0.1–1.0)
Monocytes Relative: 6 %
Neutro Abs: 2.4 10*3/uL (ref 1.7–7.7)
Neutrophils Relative %: 52 %
Platelet Count: 262 10*3/uL (ref 150–400)
RBC: 4.2 MIL/uL (ref 3.87–5.11)
RDW: 12.4 % (ref 11.5–15.5)
WBC Count: 4.5 10*3/uL (ref 4.0–10.5)
nRBC: 0 % (ref 0.0–0.2)

## 2023-03-20 LAB — CMP (CANCER CENTER ONLY)
ALT: 17 U/L (ref 0–44)
AST: 16 U/L (ref 15–41)
Albumin: 4 g/dL (ref 3.5–5.0)
Alkaline Phosphatase: 57 U/L (ref 38–126)
Anion gap: 10 (ref 5–15)
BUN: 14 mg/dL (ref 6–20)
CO2: 28 mmol/L (ref 22–32)
Calcium: 9.6 mg/dL (ref 8.9–10.3)
Chloride: 99 mmol/L (ref 98–111)
Creatinine: 0.79 mg/dL (ref 0.44–1.00)
GFR, Estimated: >60 mL/min (ref >60)
Glucose, Bld: 164 mg/dL — ABNORMAL HIGH (ref 70–99)
Potassium: 3.9 mmol/L (ref 3.5–5.1)
Sodium: 137 mmol/L (ref 135–145)
Total Bilirubin: 0.6 mg/dL (ref 0.0–1.2)
Total Protein: 7.7 g/dL (ref 6.5–8.1)

## 2023-03-20 NOTE — Progress Notes (Signed)
 Hematology/Oncology Consult Note [phone] [phone]     CHIEF COMPLAINTS/PURPOSE OF CONSULTATION:  Right breast DCIS  ASSESSMENT & PLAN:   Cancer Staging  History of breast cancer Staging form: Breast, AJCC 8th Edition - Clinical stage from 02/05/2022: Stage 0 (cTis (DCIS), cN0, cM0, ER: Not Assessed, PR: Not Assessed, HER2: Not Assessed) - Signed by Timmy Forbes, MD on 03/22/2022 - Pathologic stage from 03/01/2022: pTis (DCIS), pN0, G2, ER+, PR: Not Assessed, HER2: Not Assessed - Signed by Timmy Forbes, MD on 03/22/2022   History of breast cancer 33295 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 Labs are reviewed and discussed with patient. Continue  Tamoxifen  20 mg daily She is on Aspirin  81mg  daily. annual diagnostic mammogram November 2024 mammogram results were reviewed.   Long-term current use of tamoxifen  Recommend patient to have gynecology evaluation annually. Last seen Nov 2024  Orders Placed This Encounter  Procedures   CBC with Differential (Cancer Center Only)    Standing Status:   Future    Expected Date:   06/18/2023    Expiration Date:   03/19/2024   CMP (Cancer Center only)    Standing Status:   Future    Expected Date:   06/18/2023    Expiration Date:   03/19/2024    Follow up in 6 months.  All questions were answered. The patient knows to call the clinic with any problems, questions or concerns.  Timmy Forbes, MD, PhD [facility] Hematology Oncology 03/20/2023    HISTORY OF PRESENTING ILLNESS:  Veronica Chandler 53 y.o. female presents to establish care for DCIS of right breast I have reviewed her chart and materials related to her cancer extensively and collaborated history with the patient. Summary of oncologic history is as follows: Oncology History  History of breast cancer  12/07/2021 Mammogram   Bilateral screen mammogram showed  In the right breast, calcifications warrant further  evaluation with magnified views. In the left breast, no findings suspicious for malignancy   01/11/2022 Imaging   Unilateral right diagnostic mammogram showed . A 17 mm group of calcifications in the upper outer RIGHT breast at middle depth are indeterminate. Recommend stereotactic guided biopsy for definitive characterization.    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  Pathology showed high grade DCIS, comedo type. Calcifications associated with DCIS.    Menarche at 53 yo Age at first child born 49 yo No previous OCP use No previous chest radiation, or breast biopsy.  Family history positive for father with prostate cancer, paternal aunt with lung cancer.    02/05/2022 Cancer Staging   Staging form: Breast, AJCC 8th Edition - Clinical stage from 02/05/2022: Stage 0 (cTis (DCIS), cN0, cM0, ER: Not Assessed, PR: Not Assessed, HER2: Not Assessed) - Signed by Timmy Forbes, MD on 03/22/2022 Stage prefix: Initial diagnosis Nuclear grade: G3   02/19/2022 Surgery   S/p right lumpecotmy + SLNB  Pathology showed DCIS, intermediate grade, background benign mammary parenchyma with fibrocystic and apocrine changes and fibroadenomatoid changes.  Negative for invasive carcinoma. 2 sentinel lymph nodes were harvested and both were negative for malignancy.   Estrogen receptor positive -90%    03/01/2022 Cancer Staging   Staging form: Breast, AJCC 8th Edition - Pathologic stage from 03/01/2022: pTis (DCIS), pN0, G2, ER+, PR: Not Assessed, HER2: Not Assessed - Signed by Timmy Forbes, MD on 03/22/2022 Stage prefix:  Initial diagnosis Nuclear grade: G2 Histologic grading system: 3 grade system   04/05/2022 - 05/31/2022 Radiation Therapy   Adjuvant breast radiation.    07/27/2022 -  Anti-estrogen oral therapy   Started on tamoxifen  20 mg daily.    Patient is status post lumpectomy.  She presents to discuss pathology  results and future management plan.  Denies no new complaints.  INTERVAL HISTORY Veronica Chandler is a 53 y.o. female who has above history reviewed by me today presents for follow up visit for right breast DCIS S/p Radiation.  She started on tamoxifen  20 mg daily since May 2024.  She tolerates it.  Manageable side effects- initially she has hot flush/sweats, now resolved.   MEDICAL HISTORY:  Past Medical History:  Diagnosis Date   Breast cancer in situ    ER+; DCIS right breast   Medical history non-contributory    Pre-diabetes    Type 2 diabetes mellitus (HCC)     SURGICAL HISTORY: Past Surgical History:  Procedure Laterality Date   BREAST BIOPSY Right 02/01/2022   stereo bx, calcs, "X" clip-DCIS   BREAST BIOPSY Right 02/01/2022   MM RT BREAST BX W LOC DEV 1ST LESION IMAGE BX SPEC STEREO GUIDE 02/01/2022 [facility]   BREAST BIOPSY Right 02/14/2022   MM RT RADIO FREQUENCY TAG LOC MAMMO GUIDE 02/14/2022 [facility]   COLONOSCOPY WITH PROPOFOL  N/A 06/16/2020   Procedure: COLONOSCOPY WITH PROPOFOL ;  Surgeon: Luke Salaam, MD;  Location: [facility] ENDOSCOPY;  Service: Gastroenterology;  Laterality: N/A;   NO PAST SURGERIES     PART MASTECTOMY,RADIO FREQUENCY LOCALIZER,AXILLARY SENTINEL NODE BIOPSY Right 03/01/2022   Procedure: PART MASTECTOMY,RADIO FREQUENCY LOCALIZER,AXILLARY SENTINEL NODE BIOPSY;  Surgeon: Conrado Delay, DO;  Location: [facility] ORS;  Service: General;  Laterality: Right;    SOCIAL HISTORY: Social History   Socioeconomic History   Marital status: Divorced    Spouse name: Not on file   Number of children: Not on file   Years of education: Not on file   Highest education level: Some college, no degree  Occupational History   Not on file  Tobacco Use   Smoking status: Never   Smokeless tobacco: Never  Vaping Use   Vaping status: Never Used  Substance and Sexual Activity   Alcohol use: Yes    Comment: social   Drug use: Yes    Types: Marijuana     Comment: occasional   Sexual activity: Not on file  Other Topics Concern   Not on file  Social History Narrative   Not on file   Social Drivers of Health   Financial Resource Strain: Low Risk  (01/17/2023)   Overall Financial Resource Strain (CARDIA)    Difficulty of Paying Living Expenses: Not hard at all  Food Insecurity: Food Insecurity Present (01/17/2023)   Hunger Vital Sign    Worried About Running Out of Food in the Last Year: Sometimes true    Ran Out of Food in the Last Year: Sometimes true  Transportation Needs: No Transportation Needs (01/17/2023)   PRAPARE - Administrator, Civil Service (Medical): No    Lack of Transportation (Non-Medical): No  Physical Activity: Inactive (01/17/2023)   Exercise Vital Sign    Days of Exercise per Week: 0 days    Minutes of Exercise per Session: 30 min  Stress: No Stress Concern Present (01/17/2023)   [employer] of Occupational Health - Occupational Stress Questionnaire    Feeling of Stress : Not at all  Social Connections: Moderately  Isolated (01/17/2023)   Social Connection and Isolation Panel [NHANES]    Frequency of Communication with Friends and Family: More than three times a week    Frequency of Social Gatherings with Friends and Family: More than three times a week    Attends Religious Services: 1 to 4 times per year    Active Member of [employer] or Organizations: No    Attends Banker Meetings: Not on file    Marital Status: Divorced  Intimate Partner Violence: Not At Risk (04/26/2022)   Humiliation, Afraid, Rape, and Kick questionnaire    Fear of Current or Ex-Partner: No    Emotionally Abused: No    Physically Abused: No    Sexually Abused: No    FAMILY HISTORY: Family History  Problem Relation Age of Onset   Hypertension Mother    Diabetes Father    Prostate cancer Father    Healthy Sister    Healthy Brother    Diabetes Paternal Grandmother    Glaucoma Paternal Grandmother    Lung  cancer Maternal Aunt    Pancreatic cancer Maternal Aunt    Lung cancer Paternal Aunt    Breast cancer Neg Hx    Colon cancer Neg Hx    Ovarian cancer Neg Hx     ALLERGIES:  is allergic to flagyl  [metronidazole ].  MEDICATIONS:  Current Outpatient Medications  Medication Sig Dispense Refill   aspirin  EC 81 MG tablet Take 1 tablet (81 mg total) by mouth daily. Swallow whole. 30 tablet 12   Continuous Glucose Sensor (DEXCOM G7 SENSOR) MISC 1 Device by Does not apply route every 14 (fourteen) days. 6 each 1   fluticasone  (FLONASE ) 50 MCG/ACT nasal spray SPRAY 2 SPRAYS INTO EACH NOSTRIL EVERY DAY 16 mL 2   insulin  glargine (LANTUS ) 100 UNIT/ML Solostar Pen Inject 20 Units into the skin daily. 15 mL 11   levocetirizine (XYZAL ) 5 MG tablet TAKE 1 TABLET BY MOUTH EVERY DAY IN THE EVENING 90 tablet 2   Multiple Vitamin (MULTIVITAMIN WITH MINERALS) TABS tablet Take 1 tablet by mouth daily.     Syringe/Needle, Disp, (SYRINGE 3CC/18GX1-1/2") 18G X 1-1/2" 3 ML MISC 1 Application by Does not apply route 3 (three) times daily. 30 each 3   tamoxifen  (NOLVADEX ) 20 MG tablet Take 1 tablet (20 mg total) by mouth daily. 90 tablet 1   No current facility-administered medications for this visit.    Review of Systems  Constitutional:  Negative for appetite change, chills, fatigue and fever.  HENT:   Negative for hearing loss and voice change.   Eyes:  Negative for eye problems.  Respiratory:  Negative for chest tightness and cough.   Cardiovascular:  Negative for chest pain.  Gastrointestinal:  Negative for abdominal distention, abdominal pain and blood in stool.  Endocrine: Negative for hot flashes.  Genitourinary:  Negative for difficulty urinating and frequency.   Musculoskeletal:  Negative for arthralgias.  Skin:  Negative for itching and rash.  Neurological:  Negative for extremity weakness.  Hematological:  Negative for adenopathy.  Psychiatric/Behavioral:  Negative for confusion.       PHYSICAL EXAMINATION: ECOG PERFORMANCE STATUS: 0 - Asymptomatic  Vitals:   03/20/23 1045 03/20/23 1058  BP: (!) 158/83 (!) 144/81  Pulse: 71   Resp: 18   Temp: (!) 96.5 F (35.8 C)    Filed Weights   03/20/23 1045  Weight: 257 lb 11.2 oz (116.9 kg)    Physical Exam Constitutional:      General:  She is not in acute distress.    Appearance: She is not diaphoretic.  HENT:     Head: Normocephalic and atraumatic.     Nose: Nose normal.     Mouth/Throat:     Pharynx: No oropharyngeal exudate.  Eyes:     General: No scleral icterus.    Pupils: Pupils are equal, round, and reactive to light.  Cardiovascular:     Rate and Rhythm: Normal rate and regular rhythm.     Heart sounds: No murmur heard. Pulmonary:     Effort: Pulmonary effort is normal. No respiratory distress.     Breath sounds: No rales.  Chest:     Chest wall: No tenderness.  Abdominal:     General: There is no distension.     Palpations: Abdomen is soft.     Tenderness: There is no abdominal tenderness.  Musculoskeletal:        General: Normal range of motion.     Cervical back: Normal range of motion and neck supple.  Skin:    General: Skin is warm and dry.     Findings: No erythema.  Neurological:     Mental Status: She is alert and oriented to person, place, and time.     Cranial Nerves: No cranial nerve deficit.     Motor: No abnormal muscle tone.     Coordination: Coordination normal.  Psychiatric:        Mood and Affect: Mood and affect normal.      LABORATORY DATA:  I have reviewed the data as listed    Latest Ref Rng & Units 03/20/2023   10:26 AM 01/21/2023    9:03 AM 09/19/2022   10:24 AM  CBC  WBC 4.0 - 10.5 K/uL 4.5  4.3  4.0   Hemoglobin 12.0 - 15.0 g/dL 78.2  95.6  21.3   Hematocrit 36.0 - 46.0 % 35.4  37.1  35.6   Platelets 150 - 400 K/uL 262  314  277       Latest Ref Rng & Units 03/20/2023   10:26 AM 01/21/2023    9:03 AM 09/19/2022   10:24 AM  CMP  Glucose 70 - 99  mg/dL 086  578  469   BUN 6 - 20 mg/dL 14  14  14    Creatinine 0.44 - 1.00 mg/dL 6.29  5.28  4.13   Sodium 135 - 145 mmol/L 137  141  136   Potassium 3.5 - 5.1 mmol/L 3.9  4.1  3.8   Chloride 98 - 111 mmol/L 99  102  102   CO2 22 - 32 mmol/L 28  32  27   Calcium  8.9 - 10.3 mg/dL 9.6  9.7  9.0   Total Protein 6.5 - 8.1 g/dL 7.7  7.6  7.6   Total Bilirubin 0.0 - 1.2 mg/dL 0.6  0.3  0.4   Alkaline Phos 38 - 126 U/L 57   59   AST 15 - 41 U/L 16  14  16    ALT 0 - 44 U/L 17  13  15       RADIOGRAPHIC STUDIES: I have personally reviewed the radiological images as listed and agreed with the findings in the report. No results found.

## 2023-03-20 NOTE — Assessment & Plan Note (Addendum)
 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 Labs are reviewed and discussed with patient. Continue  Tamoxifen  20 mg daily She is on Aspirin  81mg  daily. annual diagnostic mammogram November 2024 mammogram results were reviewed.

## 2023-03-20 NOTE — Assessment & Plan Note (Signed)
 Recommend patient to have gynecology evaluation annually. Last seen Nov 2024

## 2023-03-26 ENCOUNTER — Other Ambulatory Visit: Payer: Self-pay | Admitting: Oncology

## 2023-05-01 ENCOUNTER — Other Ambulatory Visit: Payer: Self-pay | Admitting: Internal Medicine

## 2023-05-02 NOTE — Telephone Encounter (Signed)
 Requested Prescriptions  Pending Prescriptions Disp Refills   fluticasone (FLONASE) 50 MCG/ACT nasal spray [Pharmacy Med Name: FLUTICASONE PROP 50 MCG SPRAY] 16 mL 2    Sig: SPRAY 2 SPRAYS INTO EACH NOSTRIL EVERY DAY     Ear, Nose, and Throat: Nasal Preparations - Corticosteroids Passed - 05/02/2023  9:28 AM      Passed - Valid encounter within last 12 months    Recent Outpatient Visits           3 months ago Type 2 diabetes mellitus with hyperglycemia, with long-term current use of insulin [facility])   [place] [facility] [place], Salvadore [place], NP   9 months ago Encounter for general adult medical examination with abnormal findings   [place] [facility] [place], [place], NP   12 months ago Diabetic ketoacidosis without coma associated with type 2 diabetes mellitus [facility])   [place] [facility] [place], Salvadore [place], NP   1 year ago Vaginal yeast infection   [place] [facility] [place], Oswaldo [place], [place]   1 year ago Screening for cervical cancer    [facility] [place], Salvadore [place], [place]

## 2023-05-13 ENCOUNTER — Other Ambulatory Visit: Payer: Self-pay | Admitting: Internal Medicine

## 2023-05-13 NOTE — Telephone Encounter (Signed)
 Copied from CRM [id number]. Topic: Clinical - Medication Refill >> May 13, 2023  9:22 AM Elle L wrote: Most Recent Primary Care Visit:  Provider: Lorre Munroe  Department: [id number]  Visit Type: OFFICE VISIT  Date: 01/21/2023  Medication: insulin glargine (LANTUS) 100 UNIT/ML Solostar Pen  Has the patient contacted their pharmacy? Yes  Is this the correct pharmacy for this prescription? Yes  This is the patient's preferred pharmacy:  [facility] [address] [place] - [address] [place] [address] Phone: [phone] Fax: [phone]  Has the prescription been filled recently? No  Is the patient out of the medication? Yes  Has the patient been seen for an appointment in the last year OR does the patient have an upcoming appointment? Yes  Can we respond through MyChart? Yes  Agent: Please be advised that Rx refills may take up to 3 business days. We ask that you follow-up with your pharmacy.

## 2023-05-14 MED ORDER — INSULIN GLARGINE 100 UNIT/ML SOLOSTAR PEN: 20.0000 [IU] | PEN_INJECTOR | Freq: Every day | SUBCUTANEOUS | 11 refills | Status: AC

## 2023-05-14 NOTE — Telephone Encounter (Signed)
 Requested medications are due for refill today.  yes  Requested medications are on the active medications list.  yes  Last refill. 04/28/2022 15mL 11 rf  Future visit scheduled.   no  Notes to clinic.  Rx signed by Hartley Barefoot    Requested Prescriptions  Pending Prescriptions Disp Refills   insulin glargine (LANTUS) 100 UNIT/ML Solostar Pen 15 mL 11    Sig: Inject 20 Units into the skin daily.     Endocrinology:  Diabetes - Insulins Failed - 05/14/2023 12:33 PM      Failed - HBA1C is between 0 and 7.9 and within 180 days    Hgb A1c MFr Bld  Date Value Ref Range Status  01/21/2023 8.5 (H) <5.7 % of total Hgb Final    Comment:    For someone without known diabetes, a hemoglobin A1c value of 6.5% or greater indicates that they may have  diabetes and this should be confirmed with a follow-up  test. . For someone with known diabetes, a value <7% indicates  that their diabetes is well controlled and a value  greater than or equal to 7% indicates suboptimal  control. A1c targets should be individualized based on  duration of diabetes, age, comorbid conditions, and  other considerations. . Currently, no consensus exists regarding use of hemoglobin A1c for diagnosis of diabetes for children. Verna Czech - Valid encounter within last 6 months    Recent Outpatient Visits           3 months ago Type 2 diabetes mellitus with hyperglycemia, with long-term current use of insulin [facility])   [place] [facility] [place], Salvadore [place], NP   10 months ago Encounter for general adult medical examination with abnormal findings   [place] [facility] [place], [place], NP   1 year ago Diabetic ketoacidosis without coma associated with type 2 diabetes mellitus [facility])   [place] [facility] [place], Salvadore [place], NP   1 year ago Vaginal yeast infection   [place] [facility] [place], Oswaldo [place], [place]   1 year ago Screening for  cervical cancer    [facility] [place], Salvadore [place], [place]

## 2023-06-27 ENCOUNTER — Other Ambulatory Visit: Payer: Self-pay | Admitting: Internal Medicine

## 2023-06-28 NOTE — Telephone Encounter (Signed)
 Requested Prescriptions  Pending Prescriptions Disp Refills   levocetirizine (XYZAL ) 5 MG tablet [Pharmacy Med Name: LEVOCETIRIZINE 5 MG TABLET] 30 tablet 3    Sig: TAKE 1 TABLET BY MOUTH EVERY DAY IN THE EVENING     Ear, Nose, and Throat:  Antihistamines - levocetirizine dihydrochloride  Failed - 06/28/2023 10:56 AM      Failed - Valid encounter within last 12 months    Recent Outpatient Visits   None            Passed - Cr in normal range and within 360 days    Creatinine  Date Value Ref Range Status  03/20/2023 0.79 0.44 - 1.00 mg/dL Final   Creat  Date Value Ref Range Status  01/21/2023 0.76 0.50 - 1.03 mg/dL Final   Creatinine, Urine  Date Value Ref Range Status  07/27/2022 58 20 - 275 mg/dL Final         Passed - eGFR is 10 or above and within 360 days    GFR, Estimated  Date Value Ref Range Status  03/20/2023 >60 >60 mL/min Final    Comment:    (NOTE) Calculated using the CKD-EPI Creatinine Equation (2021)    GFR  Date Value Ref Range Status  05/05/2020 84.63 >60.00 mL/min Final    Comment:    Calculated using the CKD-EPI Creatinine Equation (2021)   eGFR  Date Value Ref Range Status  01/21/2023 94 > OR = 60 mL/min/1.75m2 Final

## 2023-07-18 ENCOUNTER — Encounter: Payer: Commercial Managed Care - PPO | Admitting: Internal Medicine

## 2023-07-18 ENCOUNTER — Encounter: Payer: Self-pay | Admitting: Radiation Oncology

## 2023-07-18 ENCOUNTER — Ambulatory Visit
Admission: RE | Admit: 2023-07-18 | Discharge: 2023-07-18 | Disposition: A | Discharge status: Home / Self Care | Payer: Commercial Managed Care - PPO | Source: Ambulatory Visit | Attending: Radiation Oncology | Admitting: Radiation Oncology

## 2023-07-18 VITALS — BP 166/84 | HR 80 | Temp 97.7°F | Resp 20 | Wt 252.0 lb

## 2023-07-18 DIAGNOSIS — Z923 Personal history of irradiation: Secondary | ICD-10-CM | POA: Insufficient documentation

## 2023-07-18 DIAGNOSIS — D0511 Intraductal carcinoma in situ of right breast: Secondary | ICD-10-CM | POA: Insufficient documentation

## 2023-07-18 DIAGNOSIS — Z79810 Long term (current) use of selective estrogen receptor modulators (SERMs): Secondary | ICD-10-CM | POA: Diagnosis not present

## 2023-07-18 DIAGNOSIS — Z170 Estrogen receptor positive status [ER+]: Secondary | ICD-10-CM | POA: Insufficient documentation

## 2023-07-18 NOTE — Progress Notes (Signed)
 Radiation Oncology Follow up Note  Name: Veronica Chandler   Date:   07/18/2023 MRN:  [id number] DOB: April 09, 1970    This 53 y.o. 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  Seen today in routine follow-up she is doing well.  She specifically denies breast tenderness cough or bone pain.  She is currently on.  Tamoxifen  tolerating it well without side effect.  She has been losing some weight and is being evaluated for her diabetes treatment.  She had mammograms back in October which I have reviewed were BI-RADS 2 benign she is scheduled for repeat mammograms next next October.  COMPLICATIONS OF TREATMENT: none  FOLLOW UP COMPLIANCE: keeps appointments   PHYSICAL EXAM:  BP (!) 166/84   Pulse 80   Temp 97.7 F (36.5 C) (Tympanic)   Resp 20   Wt 252 lb (114.3 kg)   BMI 47.61 kg/m  Lungs are clear to A&P cardiac examination essentially unremarkable with regular rate and rhythm. No dominant mass or nodularity is noted in either breast in 2 positions examined. Incision is well-healed. No axillary or supraclavicular adenopathy is appreciated. Cosmetic result is excellent.  Well-developed well-nourished patient in NAD. HEENT reveals PERLA, EOMI, discs not visualized.  Oral cavity is clear. No oral mucosal lesions are identified. Neck is clear without evidence of cervical or supraclavicular adenopathy. Lungs are clear to A&P. Cardiac examination is essentially unremarkable with regular rate and rhythm without murmur rub or thrill. Abdomen is benign with no organomegaly or masses noted. Motor sensory and DTR levels are equal and symmetric in the upper and lower extremities. Cranial nerves II  through XII are grossly intact. Proprioception is intact. No peripheral adenopathy or edema is identified. No motor or sensory levels are noted. Crude visual fields are within normal range.  RADIOLOGY RESULTS: Mammograms reviewed compatible with above-stated findings  PLAN: Present time patient is doing well with no evidence of disease now out 13 months from whole breast radiation.  And pleased with her overall progress.  She continues on tamoxifen  without side effect.  Have asked to see her back in 1 year for follow-up.  She continues close follow-up care with medical oncology.  Patient knows to call with any concerns.  I would like to take this opportunity to thank you for allowing me to participate in the care of your patient.Glenis Langdon, MD

## 2023-07-23 ENCOUNTER — Encounter: Payer: Self-pay | Admitting: Internal Medicine

## 2023-07-23 ENCOUNTER — Ambulatory Visit: Admitting: Internal Medicine

## 2023-07-23 VITALS — BP 140/80 | Ht 61.0 in | Wt 251.0 lb

## 2023-07-23 DIAGNOSIS — E1165 Type 2 diabetes mellitus with hyperglycemia: Secondary | ICD-10-CM | POA: Diagnosis not present

## 2023-07-23 DIAGNOSIS — E1159 Type 2 diabetes mellitus with other circulatory complications: Secondary | ICD-10-CM | POA: Insufficient documentation

## 2023-07-23 DIAGNOSIS — Z0001 Encounter for general adult medical examination with abnormal findings: Secondary | ICD-10-CM

## 2023-07-23 DIAGNOSIS — Z6841 Body Mass Index (BMI) 40.0 and over, adult: Secondary | ICD-10-CM

## 2023-07-23 DIAGNOSIS — Z794 Long term (current) use of insulin: Secondary | ICD-10-CM

## 2023-07-23 DIAGNOSIS — E66813 Obesity, class 3: Secondary | ICD-10-CM

## 2023-07-23 DIAGNOSIS — I152 Hypertension secondary to endocrine disorders: Secondary | ICD-10-CM

## 2023-07-23 MED ORDER — DEXCOM G7 SENSOR MISC: 1.0000 "application " | Status: DC | PRN

## 2023-07-23 NOTE — Patient Instructions (Signed)
 Health Maintenance for Postmenopausal Women Menopause is a normal process in which your ability to get pregnant comes to an end. This process happens slowly over many months or years, usually between the ages of 24 and 62. Menopause is complete when you have missed your menstrual period for 12 months. It is important to talk with your health care provider about some of the most common conditions that affect women after menopause (postmenopausal women). These include heart disease, cancer, and bone loss (osteoporosis). Adopting a healthy lifestyle and getting preventive care can help to promote your health and wellness. The actions you take can also lower your chances of developing some of these common conditions. What are the signs and symptoms of menopause? During menopause, you may have the following symptoms: Hot flashes. These can be moderate or severe. Night sweats. Decrease in sex drive. Mood swings. Headaches. Tiredness (fatigue). Irritability. Memory problems. Problems falling asleep or staying asleep. Talk with your health care provider about treatment options for your symptoms. Do I need hormone replacement therapy? Hormone replacement therapy is effective in treating symptoms that are caused by menopause, such as hot flashes and night sweats. Hormone replacement carries certain risks, especially as you become older. If you are thinking about using estrogen or estrogen with progestin, discuss the benefits and risks with your health care provider. How can I reduce my risk for heart disease and stroke? The risk of heart disease, heart attack, and stroke increases as you age. One of the causes may be a change in the body's hormones during menopause. This can affect how your body uses dietary fats, triglycerides, and cholesterol. Heart attack and stroke are medical emergencies. There are many things that you can do to help prevent heart disease and stroke. Watch your blood pressure High  blood pressure causes heart disease and increases the risk of stroke. This is more likely to develop in people who have high blood pressure readings or are overweight. Have your blood pressure checked: Every 3-5 years if you are 50-75 years of age. Every year if you are 77 years old or older. Eat a healthy diet  Eat a diet that includes plenty of vegetables, fruits, low-fat dairy products, and lean protein. Do not eat a lot of foods that are high in solid fats, added sugars, or sodium. Get regular exercise Get regular exercise. This is one of the most important things you can do for your health. Most adults should: Try to exercise for at least 150 minutes each week. The exercise should increase your heart rate and make you sweat (moderate-intensity exercise). Try to do strengthening exercises at least twice each week. Do these in addition to the moderate-intensity exercise. Spend less time sitting. Even light physical activity can be beneficial. Other tips Work with your health care provider to achieve or maintain a healthy weight. Do not use any products that contain nicotine or tobacco. These products include cigarettes, chewing tobacco, and vaping devices, such as e-cigarettes. If you need help quitting, ask your health care provider. Know your numbers. Ask your health care provider to check your cholesterol and your blood sugar (glucose). Continue to have your blood tested as directed by your health care provider. Do I need screening for cancer? Depending on your health history and family history, you may need to have cancer screenings at different stages of your life. This may include screening for: Breast cancer. Cervical cancer. Lung cancer. Colorectal cancer. What is my risk for osteoporosis? After menopause, you may be  at increased risk for osteoporosis. Osteoporosis is a condition in which bone destruction happens more quickly than new bone creation. To help prevent osteoporosis or  the bone fractures that can happen because of osteoporosis, you may take the following actions: If you are 61-3 years old, get at least 1,000 mg of calcium and at least 600 international units (IU) of vitamin D per day. If you are older than age 61 but younger than age 75, get at least 1,200 mg of calcium and at least 600 international units (IU) of vitamin D per day. If you are older than age 62, get at least 1,200 mg of calcium and at least 800 international units (IU) of vitamin D per day. Smoking and drinking excessive alcohol increase the risk of osteoporosis. Eat foods that are rich in calcium and vitamin D, and do weight-bearing exercises several times each week as directed by your health care provider. How does menopause affect my mental health? Depression may occur at any age, but it is more common as you become older. Common symptoms of depression include: Feeling depressed. Changes in sleep patterns. Changes in appetite or eating patterns. Feeling an overall lack of motivation or enjoyment of activities that you previously enjoyed. Frequent crying spells. Talk with your health care provider if you think that you are experiencing any of these symptoms. General instructions See your health care provider for regular wellness exams and vaccines. This may include: Scheduling regular health, dental, and eye exams. Getting and maintaining your vaccines. These include: Influenza vaccine. Get this vaccine each year before the flu season begins. Pneumonia vaccine. Shingles vaccine. Tetanus, diphtheria, and pertussis (Tdap) booster vaccine. Your health care provider may also recommend other immunizations. Tell your health care provider if you have ever been abused or do not feel safe at home. Summary Menopause is a normal process in which your ability to get pregnant comes to an end. This condition causes hot flashes, night sweats, decreased interest in sex, mood swings, headaches, or lack  of sleep. Treatment for this condition may include hormone replacement therapy. Take actions to keep yourself healthy, including exercising regularly, eating a healthy diet, watching your weight, and checking your blood pressure and blood sugar levels. Get screened for cancer and depression. Make sure that you are up to date with all your vaccines. This information is not intended to replace advice given to you by your health care provider. Make sure you discuss any questions you have with your health care provider. Document Revised: 07/11/2020 Document Reviewed: 07/11/2020 Elsevier Patient Education  2024 [employer].

## 2023-07-23 NOTE — Assessment & Plan Note (Signed)
 She declines antihypertensive medication at this time despite discussing the risk of increased risk of heart attack, stroke, vascular dementia Force-diet and exercise for weight loss

## 2023-07-23 NOTE — Assessment & Plan Note (Signed)
 Encouraged diet and exercise for weight loss ?

## 2023-07-23 NOTE — Progress Notes (Signed)
 Subjective:    Patient ID: Veronica Chandler, female    DOB: 20-Apr-1970, 53 y.o.   MRN: [id number]  HPI  Patient presents to clinic today for her annual exam. Of note, her BP today is 144/84. She is not taking any antihypertensive medication at this time.  Flu: 12/2022 Tetanus: 08/2017 COVID: Moderna x 3 Pneumovax: 05/2020 Shingrix: Never Pap smear: 11/2021 Mammogram: 01/2023 Colon screening: 06/2020 Vision screening: annually Dentist: as needed  Diet: She does eat meat. She consumes fruits and veggies. She tries to avoid fried foods. She drinks juice, water . Exercise: Walking  Review of Systems     Past Medical History:  Diagnosis Date  . Breast cancer in situ    ER+; DCIS right breast  . Medical history non-contributory   . Pre-diabetes   . Type 2 diabetes mellitus (HCC)     Current Outpatient Medications  Medication Sig Dispense Refill  . aspirin  EC 81 MG tablet Take 1 tablet (81 mg total) by mouth daily. Swallow whole. 30 tablet 12  . Continuous Glucose Sensor (DEXCOM G7 SENSOR) MISC 1 Device by Does not apply route every 14 (fourteen) days. 6 each 1  . fluticasone  (FLONASE ) 50 MCG/ACT nasal spray SPRAY 2 SPRAYS INTO EACH NOSTRIL EVERY DAY 16 mL 2  . insulin  glargine (LANTUS ) 100 UNIT/ML Solostar Pen Inject 20 Units into the skin daily. 15 mL 11  . levocetirizine (XYZAL ) 5 MG tablet TAKE 1 TABLET BY MOUTH EVERY DAY IN THE EVENING 30 tablet 3  . Multiple Vitamin (MULTIVITAMIN WITH MINERALS) TABS tablet Take 1 tablet by mouth daily.    . Syringe/Needle, Disp, (SYRINGE 3CC/18GX1-1/2") 18G X 1-1/2" 3 ML MISC 1 Application by Does not apply route 3 (three) times daily. 30 each 3  . tamoxifen  (NOLVADEX ) 20 MG tablet TAKE 1 TABLET BY MOUTH EVERY DAY 90 tablet 1   No current facility-administered medications for this visit.    Allergies  Allergen Reactions  . Flagyl  [Metronidazole ] Other (See Comments)    Causes blurred vision, dizziness, fainting.     Family  History  Problem Relation Age of Onset  . Hypertension Mother   . Diabetes Father   . Prostate cancer Father   . Healthy Sister   . Healthy Brother   . Diabetes Paternal Grandmother   . Glaucoma Paternal Grandmother   . Lung cancer Maternal Aunt   . Pancreatic cancer Maternal Aunt   . Lung cancer Paternal Aunt   . Breast cancer Neg Hx   . Colon cancer Neg Hx   . Ovarian cancer Neg Hx     Social History   Socioeconomic History  . Marital status: Divorced    Spouse name: Not on file  . Number of children: Not on file  . Years of education: Not on file  . Highest education level: Associate degree: occupational, Scientist, product/process development, or vocational program  Occupational History  . Not on file  Tobacco Use  . Smoking status: Never  . Smokeless tobacco: Never  Vaping Use  . Vaping status: Never Used  Substance and Sexual Activity  . Alcohol use: Yes    Comment: social  . Drug use: Yes    Types: Marijuana    Comment: occasional  . Sexual activity: Not on file  Other Topics Concern  . Not on file  Social History Narrative  . Not on file   Social Drivers of Health   Financial Resource Strain: Low Risk  (07/19/2023)   Overall Financial Resource Strain (CARDIA)   .  Difficulty of Paying Living Expenses: Not hard at all  Food Insecurity: Food Insecurity Present (07/19/2023)   Hunger Vital Sign   . Worried About Programme researcher, broadcasting/film/video in the Last Year: Never true   . Ran Out of Food in the Last Year: Sometimes true  Transportation Needs: No Transportation Needs (07/19/2023)   PRAPARE - Transportation   . Lack of Transportation (Medical): No   . Lack of Transportation (Non-Medical): No  Physical Activity: Insufficiently Active (07/19/2023)   Exercise Vital Sign   . Days of Exercise per Week: 2 days   . Minutes of Exercise per Session: 40 min  Stress: No Stress Concern Present (07/19/2023)   [employer] of Occupational Health - Occupational Stress Questionnaire   . Feeling of Stress  : Not at all  Social Connections: Moderately Isolated (07/19/2023)   Social Connection and Isolation Panel [NHANES]   . Frequency of Communication with Friends and Family: More than three times a week   . Frequency of Social Gatherings with Friends and Family: Twice a week   . Attends Religious Services: 1 to 4 times per year   . Active Member of Clubs or Organizations: No   . Attends Banker Meetings: Not on file   . Marital Status: Divorced  Catering manager Violence: Not At Risk (04/26/2022)   Humiliation, Afraid, Rape, and Kick questionnaire   . Fear of Current or Ex-Partner: No   . Emotionally Abused: No   . Physically Abused: No   . Sexually Abused: No     Constitutional: Denies fever, malaise, fatigue, headache or abrupt weight changes.  HEENT: Denies eye pain, eye redness, ear pain, ringing in the ears, wax buildup, runny nose, nasal congestion, bloody nose, or sore throat. Respiratory: Denies difficulty breathing, shortness of breath, cough or sputum production.   Cardiovascular: Denies chest pain, chest tightness, palpitations or swelling in the hands or feet.  Gastrointestinal: Denies abdominal pain, bloating, constipation, diarrhea or blood in the stool.  GU: Denies urgency, frequency, pain with urination, burning sensation, blood in urine, odor or discharge. Musculoskeletal: Denies decrease in range of motion, difficulty with gait, muscle pain or joint pain and swelling.  Skin: Denies redness, rashes, lesions or ulcercations.  Neurological: Denies dizziness, difficulty with memory, difficulty with speech or problems with balance and coordination.  Psych: Denies anxiety, depression, SI/HI.  No other specific complaints in a complete review of systems (except as listed in HPI above).  Objective:   Physical Exam  BP (!) 144/84 (BP Location: Left Arm, Patient Position: Sitting, Cuff Size: Large)   Ht 5\' 1"  (1.549 m)   Wt 251 lb (113.9 kg)   BMI 47.43 kg/m     Wt Readings from Last 3 Encounters:  07/18/23 252 lb (114.3 kg)  03/20/23 257 lb 11.2 oz (116.9 kg)  01/21/23 257 lb 12.8 oz (116.9 kg)    General: Appears her stated age, obese, in NAD. Skin: Warm, dry and intact. No ulcerations noted. HEENT: Head: normal shape and size; Eyes: sclera white, no icterus, conjunctiva pink, PERRLA and EOMs intact;  Neck:  Neck supple, trachea midline. No masses, lumps or thyromegaly present.  Cardiovascular: Normal rate and rhythm. S1,S2 noted.  No murmur, rubs or gallops noted. No JVD or BLE edema. No carotid bruits noted. Pulmonary/Chest: Normal effort and positive vesicular breath sounds. No respiratory distress. No wheezes, rales or ronchi noted.  Abdomen:  Normal bowel sounds.  Musculoskeletal: Strength 5/5 BUE/BLE. No difficulty with gait.  Neurological: Alert  and oriented. Cranial nerves II-XII grossly intact. Coordination normal.  Psychiatric: Mood and affect normal. Behavior is normal. Judgment and thought content normal.    BMET    Component Value Date/Time   NA 137 03/20/2023 1026   K 3.9 03/20/2023 1026   CL 99 03/20/2023 1026   CO2 28 03/20/2023 1026   GLUCOSE 164 (H) 03/20/2023 1026   BUN 14 03/20/2023 1026   CREATININE 0.79 03/20/2023 1026   CREATININE 0.76 01/21/2023 0903   CALCIUM  9.6 03/20/2023 1026   GFRNONAA >60 03/20/2023 1026    Lipid Panel     Component Value Date/Time   CHOL 165 01/21/2023 0903   TRIG 94 01/21/2023 0903   HDL 63 01/21/2023 0903   CHOLHDL 2.6 01/21/2023 0903   VLDL 17 04/27/2022 0351   LDLCALC 83 01/21/2023 0903    CBC    Component Value Date/Time   WBC 4.5 03/20/2023 1026   WBC 4.3 01/21/2023 0903   RBC 4.20 03/20/2023 1026   HGB 12.3 03/20/2023 1026   HCT 35.4 (L) 03/20/2023 1026   PLT 262 03/20/2023 1026   MCV 84.3 03/20/2023 1026   MCH 29.3 03/20/2023 1026   MCHC 34.7 03/20/2023 1026   RDW 12.4 03/20/2023 1026   LYMPHSABS 1.7 03/20/2023 1026   MONOABS 0.3 03/20/2023 1026    EOSABS 0.1 03/20/2023 1026   BASOSABS 0.0 03/20/2023 1026    Hgb A1C Lab Results  Component Value Date   HGBA1C 8.5 (H) 01/21/2023           Assessment & Plan:   Preventative Health Maintenance:  Encouraged her to get a flu shot in the fall Tetanus UTD Encouraged her to get her COVID booster Pneumovax UTD Discussed Shingrix vaccine, she will check coverage with her insurance company and schedule visit if she would like to have this done Pap smear UTD Mammogram ordered, she will call to schedule Colon screening UTD Encouraged her to consume a balanced diet and exercise regimen Advised her to see an eye doctor and dentist annually Will check CBC, c-Met, lipid, A1c and urine microalbumin today  RTC in 6 months, follow-up chronic conditions Helayne Lo, NP

## 2023-07-24 ENCOUNTER — Ambulatory Visit: Payer: Self-pay | Admitting: Internal Medicine

## 2023-07-24 LAB — COMPREHENSIVE METABOLIC PANEL WITH GFR
AG Ratio: 1.3 (calc) (ref 1.0–2.5)
ALT: 12 U/L (ref 6–29)
AST: 13 U/L (ref 10–35)
Albumin: 4.2 g/dL (ref 3.6–5.1)
Alkaline phosphatase (APISO): 58 U/L (ref 37–153)
BUN: 15 mg/dL (ref 7–25)
CO2: 29 mmol/L (ref 20–32)
Calcium: 9.8 mg/dL (ref 8.6–10.4)
Chloride: 103 mmol/L (ref 98–110)
Creat: 0.78 mg/dL (ref 0.50–1.03)
Globulin: 3.3 g/dL (ref 1.9–3.7)
Glucose, Bld: 116 mg/dL (ref 65–139)
Potassium: 4.1 mmol/L (ref 3.5–5.3)
Sodium: 141 mmol/L (ref 135–146)
Total Bilirubin: 0.4 mg/dL (ref 0.2–1.2)
Total Protein: 7.5 g/dL (ref 6.1–8.1)
eGFR: 91 mL/min/1.73m2

## 2023-07-24 LAB — LIPID PANEL
Cholesterol: 161 mg/dL (ref <200)
HDL: 56 mg/dL (ref >=50)
LDL Cholesterol (Calc): 85 mg/dL
Non-HDL Cholesterol (Calc): 105 mg/dL (ref <130)
Total CHOL/HDL Ratio: 2.9 (calc) (ref <5.0)
Triglycerides: 102 mg/dL (ref <150)

## 2023-07-24 LAB — MICROALBUMIN / CREATININE URINE RATIO
Creatinine, Urine: 48 mg/dL (ref 20–275)
Microalb, Ur: <0.2 mg/dL

## 2023-07-24 LAB — CBC
HCT: 38.5 % (ref 35.0–45.0)
Hemoglobin: 12.5 g/dL (ref 11.7–15.5)
MCH: 28.8 pg (ref 27.0–33.0)
MCHC: 32.5 g/dL (ref 32.0–36.0)
MCV: 88.7 fL (ref 80.0–100.0)
MPV: 8.9 fL (ref 7.5–12.5)
Platelets: 299 10*3/uL (ref 140–400)
RBC: 4.34 10*6/uL (ref 3.80–5.10)
RDW: 13.1 % (ref 11.0–15.0)
WBC: 4.9 10*3/uL (ref 3.8–10.8)

## 2023-07-24 LAB — HEMOGLOBIN A1C
Hgb A1c MFr Bld: 8.3 % — ABNORMAL HIGH (ref <5.7)
Mean Plasma Glucose: 192 mg/dL
eAG (mmol/L): 10.6 mmol/L

## 2023-08-20 ENCOUNTER — Telehealth: Payer: Self-pay

## 2023-08-20 NOTE — Telephone Encounter (Signed)
 Dexcom sensor placed up front for patient to pick up tomorrow.

## 2023-08-20 NOTE — Telephone Encounter (Signed)
 Copied from CRM [id number]. Topic: General - Other >> Aug 20, 2023 10:05 AM Marissa P wrote: Reason for CRM: Patient wont be able to pick up disk com until tomorrow please, thank you

## 2023-09-19 ENCOUNTER — Inpatient Hospital Stay (HOSPITAL_BASED_OUTPATIENT_CLINIC_OR_DEPARTMENT_OTHER): Payer: Commercial Managed Care - PPO | Admitting: Oncology

## 2023-09-19 ENCOUNTER — Encounter: Payer: Self-pay | Admitting: Oncology

## 2023-09-19 ENCOUNTER — Inpatient Hospital Stay: Payer: Commercial Managed Care - PPO | Attending: Oncology

## 2023-09-19 VITALS — BP 146/83 | HR 68 | Temp 97.7°F | Resp 18 | Wt 248.6 lb

## 2023-09-19 DIAGNOSIS — Z881 Allergy status to other antibiotic agents status: Secondary | ICD-10-CM | POA: Diagnosis not present

## 2023-09-19 DIAGNOSIS — Z853 Personal history of malignant neoplasm of breast: Secondary | ICD-10-CM

## 2023-09-19 DIAGNOSIS — Z83511 Family history of glaucoma: Secondary | ICD-10-CM | POA: Diagnosis not present

## 2023-09-19 DIAGNOSIS — Z8249 Family history of ischemic heart disease and other diseases of the circulatory system: Secondary | ICD-10-CM | POA: Diagnosis not present

## 2023-09-19 DIAGNOSIS — Z823 Family history of stroke: Secondary | ICD-10-CM | POA: Insufficient documentation

## 2023-09-19 DIAGNOSIS — Z79810 Long term (current) use of selective estrogen receptor modulators (SERMs): Secondary | ICD-10-CM | POA: Insufficient documentation

## 2023-09-19 DIAGNOSIS — Z809 Family history of malignant neoplasm, unspecified: Secondary | ICD-10-CM | POA: Insufficient documentation

## 2023-09-19 DIAGNOSIS — Z923 Personal history of irradiation: Secondary | ICD-10-CM | POA: Insufficient documentation

## 2023-09-19 DIAGNOSIS — Z7982 Long term (current) use of aspirin: Secondary | ICD-10-CM | POA: Diagnosis not present

## 2023-09-19 DIAGNOSIS — E119 Type 2 diabetes mellitus without complications: Secondary | ICD-10-CM | POA: Diagnosis not present

## 2023-09-19 DIAGNOSIS — Z801 Family history of malignant neoplasm of trachea, bronchus and lung: Secondary | ICD-10-CM | POA: Insufficient documentation

## 2023-09-19 DIAGNOSIS — Z833 Family history of diabetes mellitus: Secondary | ICD-10-CM | POA: Insufficient documentation

## 2023-09-19 DIAGNOSIS — D0511 Intraductal carcinoma in situ of right breast: Secondary | ICD-10-CM | POA: Insufficient documentation

## 2023-09-19 DIAGNOSIS — Z170 Estrogen receptor positive status [ER+]: Secondary | ICD-10-CM | POA: Insufficient documentation

## 2023-09-19 DIAGNOSIS — Z825 Family history of asthma and other chronic lower respiratory diseases: Secondary | ICD-10-CM | POA: Diagnosis not present

## 2023-09-19 DIAGNOSIS — Z79899 Other long term (current) drug therapy: Secondary | ICD-10-CM | POA: Insufficient documentation

## 2023-09-19 LAB — CBC WITH DIFFERENTIAL (CANCER CENTER ONLY)
Abs Immature Granulocytes: 0.01 K/uL (ref 0.00–0.07)
Basophils Absolute: 0 K/uL (ref 0.0–0.1)
Basophils Relative: 1 %
Eosinophils Absolute: 0.1 K/uL (ref 0.0–0.5)
Eosinophils Relative: 3 %
HCT: 36.2 % (ref 36.0–46.0)
Hemoglobin: 12.3 g/dL (ref 12.0–15.0)
Immature Granulocytes: 0 %
Lymphocytes Relative: 39 %
Lymphs Abs: 1.8 K/uL (ref 0.7–4.0)
MCH: 28.8 pg (ref 26.0–34.0)
MCHC: 34 g/dL (ref 30.0–36.0)
MCV: 84.8 fL (ref 80.0–100.0)
Monocytes Absolute: 0.3 K/uL (ref 0.1–1.0)
Monocytes Relative: 6 %
Neutro Abs: 2.3 K/uL (ref 1.7–7.7)
Neutrophils Relative %: 51 %
Platelet Count: 277 K/uL (ref 150–400)
RBC: 4.27 MIL/uL (ref 3.87–5.11)
RDW: 13 % (ref 11.5–15.5)
WBC Count: 4.5 K/uL (ref 4.0–10.5)
nRBC: 0 % (ref 0.0–0.2)

## 2023-09-19 LAB — CMP (CANCER CENTER ONLY)
ALT: 16 U/L (ref 0–44)
AST: 14 U/L — ABNORMAL LOW (ref 15–41)
Albumin: 4.1 g/dL (ref 3.5–5.0)
Alkaline Phosphatase: 55 U/L (ref 38–126)
Anion gap: 9 (ref 5–15)
BUN: 13 mg/dL (ref 6–20)
CO2: 25 mmol/L (ref 22–32)
Calcium: 9.3 mg/dL (ref 8.9–10.3)
Chloride: 102 mmol/L (ref 98–111)
Creatinine: 0.7 mg/dL (ref 0.44–1.00)
GFR, Estimated: >60 mL/min (ref >60)
Glucose, Bld: 145 mg/dL — ABNORMAL HIGH (ref 70–99)
Potassium: 4 mmol/L (ref 3.5–5.1)
Sodium: 136 mmol/L (ref 135–145)
Total Bilirubin: 0.6 mg/dL (ref 0.0–1.2)
Total Protein: 7.7 g/dL (ref 6.5–8.1)

## 2023-09-19 MED ORDER — TAMOXIFEN CITRATE 20 MG PO TABS: 20.0000 mg | ORAL_TABLET | Freq: Every day | ORAL | 1 refills | Status: DC

## 2023-09-19 NOTE — Progress Notes (Signed)
 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 Labs are reviewed and discussed with patient. Continue  Tamoxifen  20 mg daily She is on Aspirin  81mg  daily. annual diagnostic mammogram -Oct 2025   Long-term current use of tamoxifen  Follow up with gynecology evaluation annually.   Orders Placed This Encounter  Procedures   MM 3D DIAGNOSTIC MAMMOGRAM BILATERAL BREAST    Standing Status:   Future    Expected Date:   12/17/2023    Expiration Date:   09/18/2024    Reason for Exam (SYMPTOM  OR DIAGNOSIS REQUIRED):   hx breast cancer    Is the patient pregnant?:   No    Preferred imaging location?:   [facility]   CBC with Differential (Cancer Center Only)    Standing Status:   Future    Expected Date:   03/21/2024    Expiration Date:   06/19/2024   CMP (Cancer Center only)    Standing Status:   Future    Expected Date:   03/21/2024    Expiration Date:   06/19/2024    Follow up in 6 months.  All questions were answered. The patient knows to call the clinic with any problems, questions or concerns.  Call Babara, MD, PhD [facility] Hematology Oncology 09/19/2023    HISTORY OF PRESENTING ILLNESS:  Veronica Chandler 53 y.o. female presents to establish care for DCIS of right breast I have reviewed her chart and materials related to her cancer extensively and  collaborated history with the patient. Summary of oncologic history is as follows: Oncology History  History of breast cancer  12/07/2021 Mammogram   Bilateral screen mammogram showed  In the right breast, calcifications warrant further evaluation with magnified views. In the left breast, no findings suspicious for malignancy   01/11/2022 Imaging   Unilateral right diagnostic mammogram showed . A 17 mm group of calcifications in the upper outer RIGHT breast at middle depth are indeterminate. Recommend stereotactic guided biopsy for definitive characterization.    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  Pathology showed high grade DCIS, comedo type. Calcifications associated with DCIS.    Menarche at 53 yo Age at first child born 89 yo No previous OCP use No previous chest radiation, or breast biopsy.  Family history positive for father with prostate cancer, paternal aunt with lung cancer.    02/05/2022 Cancer Staging   Staging form: Breast, AJCC 8th Edition - Clinical stage from 02/05/2022: Stage 0 (cTis (DCIS), cN0, cM0, ER: Not Assessed, PR: Not Assessed, HER2: Not Assessed) - Signed by Babara Call, MD on 03/22/2022 Stage prefix: Initial diagnosis Nuclear grade: G3   02/19/2022 Surgery   S/p right lumpecotmy + SLNB  Pathology showed DCIS, intermediate grade, background benign mammary parenchyma with fibrocystic and apocrine changes and fibroadenomatoid changes.  Negative for invasive carcinoma. 2 sentinel lymph nodes were harvested and both were negative for malignancy.   Estrogen receptor positive -90%    03/01/2022 Cancer Staging   Staging form: Breast, AJCC 8th Edition - Pathologic stage from 03/01/2022: pTis (DCIS), pN0, G2, ER+, PR: Not Assessed, HER2: Not Assessed - Signed by Babara Call, MD on 03/22/2022 Stage prefix: Initial diagnosis Nuclear grade: G2 Histologic grading system: 3 grade  system   04/05/2022 - 05/31/2022 Radiation Therapy   Adjuvant breast radiation.    07/27/2022 -  Anti-estrogen oral therapy   Started on tamoxifen  20 mg daily.    Patient is status post lumpectomy.  She presents to discuss pathology results and future management plan.  Denies no new complaints.  INTERVAL HISTORY Veronica Chandler is a 53 y.o. female who has above history reviewed by me today presents for follow up visit for right breast DCIS S/p Radiation.  She started on tamoxifen  20 mg daily since May 2024.  She tolerates it.  Manageable side effects  MEDICAL HISTORY:  Past Medical History:  Diagnosis Date   Allergy Little   Anemia Little   Breast cancer in situ    ER+; DCIS right breast   GERD (gastroesophageal reflux disease) One year ago   Medical history non-contributory    Pre-diabetes    Sickle cell anemia (HCC) Little   Type 2 diabetes mellitus (HCC)     SURGICAL HISTORY: Past Surgical History:  Procedure Laterality Date   BREAST BIOPSY Right 02/01/2022   stereo bx, calcs, X clip-DCIS   BREAST BIOPSY Right 02/01/2022   MM RT BREAST BX W LOC DEV 1ST LESION IMAGE BX SPEC STEREO GUIDE 02/01/2022 [facility]   BREAST BIOPSY Right 02/14/2022   MM RT RADIO FREQUENCY TAG LOC MAMMO GUIDE 02/14/2022 [facility]   COLONOSCOPY WITH PROPOFOL  N/A 06/16/2020   Procedure: COLONOSCOPY WITH PROPOFOL ;  Surgeon: Therisa Bi, MD;  Location: [facility] ENDOSCOPY;  Service: Gastroenterology;  Laterality: N/A;   NO PAST SURGERIES     PART MASTECTOMY,RADIO FREQUENCY LOCALIZER,AXILLARY SENTINEL NODE BIOPSY Right 03/01/2022   Procedure: PART MASTECTOMY,RADIO FREQUENCY LOCALIZER,AXILLARY SENTINEL NODE BIOPSY;  Surgeon: Tye Millet, DO;  Location: [facility] ORS;  Service: General;  Laterality: Right;    SOCIAL HISTORY: Social History   Socioeconomic History   Marital status: Divorced    Spouse name: Not on file   Number of children: Not on file   Years of education: Not on file    Highest education level: Associate degree: occupational, Scientist, product/process development, or vocational program  Occupational History   Not on file  Tobacco Use   Smoking status: Never   Smokeless tobacco: Never  Vaping Use   Vaping status: Never Used  Substance and Sexual Activity   Alcohol use: Not Currently    Alcohol/week: 2.0 standard drinks of alcohol    Types: 2 Standard drinks or equivalent per week    Comment: None now   Drug use: Yes    Types: Marijuana    Comment: occasional   Sexual activity: Yes    Birth control/protection: None  Other Topics Concern   Not on file  Social History Narrative   Not on file   Social Drivers of Health   Financial Resource Strain: Low Risk  (07/19/2023)   Overall Financial Resource Strain (CARDIA)    Difficulty of Paying Living Expenses: Not hard at all  Food Insecurity: Food Insecurity Present (07/19/2023)   Hunger Vital Sign    Worried About Running Out of Food in the  Last Year: Never true    Ran Out of Food in the Last Year: Sometimes true  Transportation Needs: No Transportation Needs (07/19/2023)   PRAPARE - Administrator, Civil Service (Medical): No    Lack of Transportation (Non-Medical): No  Physical Activity: Insufficiently Active (07/19/2023)   Exercise Vital Sign    Days of Exercise per Week: 2 days    Minutes of Exercise per Session: 40 min  Stress: No Stress Concern Present (07/19/2023)   [employer] of Occupational Health - Occupational Stress Questionnaire    Feeling of Stress : Not at all  Social Connections: Moderately Isolated (07/19/2023)   Social Connection and Isolation Panel    Frequency of Communication with Friends and Family: More than three times a week    Frequency of Social Gatherings with Friends and Family: Twice a week    Attends Religious Services: 1 to 4 times per year    Active Member of [employer] or Organizations: No    Attends Engineer, structural: Not on file    Marital Status: Divorced   Intimate Partner Violence: Not At Risk (04/26/2022)   Humiliation, Afraid, Rape, and Kick questionnaire    Fear of Current or Ex-Partner: No    Emotionally Abused: No    Physically Abused: No    Sexually Abused: No    FAMILY HISTORY: Family History  Problem Relation Age of Onset   Hypertension Mother    Diabetes Father    Prostate cancer Father    Healthy Sister    Healthy Brother    Cancer Maternal Grandmother    Stroke Maternal Grandmother    Diabetes Paternal Grandmother    Glaucoma Paternal Grandmother    Lung cancer Maternal Aunt    Pancreatic cancer Maternal Aunt    Lung cancer Paternal Aunt    Asthma Son    Breast cancer Neg Hx    Colon cancer Neg Hx    Ovarian cancer Neg Hx     ALLERGIES:  is allergic to flagyl  [metronidazole ].  MEDICATIONS:  Current Outpatient Medications  Medication Sig Dispense Refill   aspirin  EC 81 MG tablet Take 1 tablet (81 mg total) by mouth daily. Swallow whole. 30 tablet 12   Continuous Glucose Sensor (DEXCOM G7 SENSOR) MISC 1 Device by Does not apply route every 14 (fourteen) days. 6 each 1   Continuous Glucose Sensor (DEXCOM G7 SENSOR) MISC 1 application  by Does not apply route as needed.     fluticasone  (FLONASE ) 50 MCG/ACT nasal spray SPRAY 2 SPRAYS INTO EACH NOSTRIL EVERY DAY 16 mL 2   insulin  glargine (LANTUS ) 100 UNIT/ML Solostar Pen Inject 20 Units into the skin daily. 15 mL 11   levocetirizine (XYZAL ) 5 MG tablet TAKE 1 TABLET BY MOUTH EVERY DAY IN THE EVENING 30 tablet 3   Multiple Vitamin (MULTIVITAMIN WITH MINERALS) TABS tablet Take 1 tablet by mouth daily.     Syringe/Needle, Disp, (SYRINGE 3CC/18GX1-1/2) 18G X 1-1/2 3 ML MISC 1 Application by Does not apply route 3 (three) times daily. 30 each 3   tamoxifen  (NOLVADEX ) 20 MG tablet Take 1 tablet (20 mg total) by mouth daily. 90 tablet 1   No current facility-administered medications for this visit.    Review of Systems  Constitutional:  Negative for appetite change,  chills, fatigue and fever.  HENT:   Negative for hearing loss and voice change.   Eyes:  Negative for eye problems.  Respiratory:  Negative for chest tightness and  cough.   Cardiovascular:  Negative for chest pain.  Gastrointestinal:  Negative for abdominal distention, abdominal pain and blood in stool.  Endocrine: Negative for hot flashes.  Genitourinary:  Negative for difficulty urinating and frequency.   Musculoskeletal:  Negative for arthralgias.  Skin:  Negative for itching and rash.  Neurological:  Negative for extremity weakness.  Hematological:  Negative for adenopathy.  Psychiatric/Behavioral:  Negative for confusion.      PHYSICAL EXAMINATION: ECOG PERFORMANCE STATUS: 0 - Asymptomatic  Vitals:   09/19/23 0947 09/19/23 0954  BP: (!) 174/69 (!) 146/83  Pulse: 68   Resp: 18   Temp: 97.7 F (36.5 C)   SpO2: 100%    Filed Weights   09/19/23 0947  Weight: 248 lb 9.6 oz (112.8 kg)    Physical Exam Constitutional:      General: She is not in acute distress.    Appearance: She is not diaphoretic.  HENT:     Head: Normocephalic and atraumatic.     Nose: Nose normal.     Mouth/Throat:     Pharynx: No oropharyngeal exudate.  Eyes:     General: No scleral icterus.    Pupils: Pupils are equal, round, and reactive to light.  Cardiovascular:     Rate and Rhythm: Normal rate and regular rhythm.     Heart sounds: No murmur heard. Pulmonary:     Effort: Pulmonary effort is normal. No respiratory distress.     Breath sounds: No rales.  Chest:     Chest wall: No tenderness.  Abdominal:     General: There is no distension.     Palpations: Abdomen is soft.     Tenderness: There is no abdominal tenderness.  Musculoskeletal:        General: Normal range of motion.     Cervical back: Normal range of motion and neck supple.  Skin:    General: Skin is warm and dry.     Findings: No erythema.  Neurological:     Mental Status: She is alert and oriented to person, place,  and time.     Cranial Nerves: No cranial nerve deficit.     Motor: No abnormal muscle tone.     Coordination: Coordination normal.  Psychiatric:        Mood and Affect: Mood and affect normal.      LABORATORY DATA:  I have reviewed the data as listed    Latest Ref Rng & Units 09/19/2023    9:38 AM 07/23/2023    1:35 PM 03/20/2023   10:26 AM  CBC  WBC 4.0 - 10.5 K/uL 4.5  4.9  4.5   Hemoglobin 12.0 - 15.0 g/dL 87.6  87.4  87.6   Hematocrit 36.0 - 46.0 % 36.2  38.5  35.4   Platelets 150 - 400 K/uL 277  299  262       Latest Ref Rng & Units 09/19/2023    9:38 AM 07/23/2023    1:35 PM 03/20/2023   10:26 AM  CMP  Glucose 70 - 99 mg/dL 854  883  835   BUN 6 - 20 mg/dL 13  15  14    Creatinine 0.44 - 1.00 mg/dL 9.29  9.21  9.20   Sodium 135 - 145 mmol/L 136  141  137   Potassium 3.5 - 5.1 mmol/L 4.0  4.1  3.9   Chloride 98 - 111 mmol/L 102  103  99   CO2 22 - 32 mmol/L 25  29  28   Calcium  8.9 - 10.3 mg/dL 9.3  9.8  9.6   Total Protein 6.5 - 8.1 g/dL 7.7  7.5  7.7   Total Bilirubin 0.0 - 1.2 mg/dL 0.6  0.4  0.6   Alkaline Phos 38 - 126 U/L 55   57   AST 15 - 41 U/L 14  13  16    ALT 0 - 44 U/L 16  12  17       RADIOGRAPHIC STUDIES: I have personally reviewed the radiological images as listed and agreed with the findings in the report. No results found.

## 2023-09-19 NOTE — Assessment & Plan Note (Signed)
 Follow up with gynecology evaluation annually.

## 2023-09-19 NOTE — Assessment & Plan Note (Addendum)
 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 Labs are reviewed and discussed with patient. Continue  Tamoxifen  20 mg daily She is on Aspirin  81mg  daily. annual diagnostic mammogram -Oct 2025

## 2023-10-19 ENCOUNTER — Other Ambulatory Visit: Payer: Self-pay | Admitting: Internal Medicine

## 2023-10-22 NOTE — Telephone Encounter (Signed)
 Requested Prescriptions  Pending Prescriptions Disp Refills   fluticasone  (FLONASE ) 50 MCG/ACT nasal spray [Pharmacy Med Name: FLUTICASONE  PROP 50 MCG SPRAY] 16 mL 2    Sig: SPRAY 2 SPRAYS INTO EACH NOSTRIL EVERY DAY     Ear, Nose, and Throat: Nasal Preparations - Corticosteroids Passed - 10/22/2023  2:00 PM      Passed - Valid encounter within last 12 months    Recent Outpatient Visits           3 months ago Encounter for general adult medical examination with abnormal findings   [facility] [place], Angeline ORN, NP

## 2023-11-02 ENCOUNTER — Other Ambulatory Visit: Payer: Self-pay | Admitting: Internal Medicine

## 2023-11-05 NOTE — Telephone Encounter (Signed)
 Requested Prescriptions  Pending Prescriptions Disp Refills   levocetirizine (XYZAL ) 5 MG tablet [Pharmacy Med Name: LEVOCETIRIZINE 5 MG TABLET] 30 tablet 3    Sig: TAKE 1 TABLET BY MOUTH EVERY DAY IN THE EVENING     Ear, Nose, and Throat:  Antihistamines - levocetirizine dihydrochloride  Passed - 11/05/2023 11:04 AM      Passed - Cr in normal range and within 360 days    Creatinine  Date Value Ref Range Status  09/19/2023 0.70 0.44 - 1.00 mg/dL Final   Creat  Date Value Ref Range Status  07/23/2023 0.78 0.50 - 1.03 mg/dL Final   Creatinine, Urine  Date Value Ref Range Status  07/23/2023 48 20 - 275 mg/dL Final         Passed - eGFR is 10 or above and within 360 days    GFR, Estimated  Date Value Ref Range Status  09/19/2023 >60 >60 mL/min Final    Comment:    (NOTE) Calculated using the CKD-EPI Creatinine Equation (2021)    GFR  Date Value Ref Range Status  05/05/2020 84.63 >60.00 mL/min Final    Comment:    Calculated using the CKD-EPI Creatinine Equation (2021)   eGFR  Date Value Ref Range Status  07/23/2023 91 > OR = 60 mL/min/1.21m2 Final         Passed - Valid encounter within last 12 months    Recent Outpatient Visits           3 months ago Encounter for general adult medical examination with abnormal findings   [facility] [place], Angeline ORN, NP

## 2023-12-17 ENCOUNTER — Ambulatory Visit
Admission: RE | Admit: 2023-12-17 | Discharge: 2023-12-17 | Disposition: A | Discharge status: Home / Self Care | Source: Ambulatory Visit | Attending: Oncology | Admitting: Oncology

## 2023-12-17 DIAGNOSIS — Z853 Personal history of malignant neoplasm of breast: Secondary | ICD-10-CM | POA: Insufficient documentation

## 2024-01-24 ENCOUNTER — Ambulatory Visit: Admitting: Internal Medicine

## 2024-01-24 ENCOUNTER — Encounter: Payer: Self-pay | Admitting: Internal Medicine

## 2024-01-24 VITALS — BP 150/90 | Ht 61.0 in | Wt 250.6 lb

## 2024-01-24 DIAGNOSIS — Z794 Long term (current) use of insulin: Secondary | ICD-10-CM

## 2024-01-24 DIAGNOSIS — E1159 Type 2 diabetes mellitus with other circulatory complications: Secondary | ICD-10-CM

## 2024-01-24 DIAGNOSIS — Z853 Personal history of malignant neoplasm of breast: Secondary | ICD-10-CM

## 2024-01-24 DIAGNOSIS — E66813 Obesity, class 3: Secondary | ICD-10-CM

## 2024-01-24 DIAGNOSIS — Z6841 Body Mass Index (BMI) 40.0 and over, adult: Secondary | ICD-10-CM

## 2024-01-24 DIAGNOSIS — N950 Postmenopausal bleeding: Secondary | ICD-10-CM

## 2024-01-24 DIAGNOSIS — E1165 Type 2 diabetes mellitus with hyperglycemia: Secondary | ICD-10-CM

## 2024-01-24 DIAGNOSIS — I152 Hypertension secondary to endocrine disorders: Secondary | ICD-10-CM

## 2024-01-24 LAB — CBC
HCT: 37 % (ref 35.0–45.0)
Hemoglobin: 12.1 g/dL (ref 11.7–15.5)
MCH: 29.5 pg (ref 27.0–33.0)
MCHC: 32.7 g/dL (ref 32.0–36.0)
MCV: 90.2 fL (ref 80.0–100.0)
MPV: 9.1 fL (ref 7.5–12.5)
Platelets: 293 Thousand/uL (ref 140–400)
RBC: 4.1 Million/uL (ref 3.80–5.10)
RDW: 13.5 % (ref 11.0–15.0)
WBC: 3.9 Thousand/uL (ref 3.8–10.8)

## 2024-01-24 LAB — HEMOGLOBIN A1C
Hgb A1c MFr Bld: 7.5 % — ABNORMAL HIGH (ref <5.7)
Mean Plasma Glucose: 169 mg/dL
eAG (mmol/L): 9.3 mmol/L

## 2024-01-24 LAB — TSH: TSH: 1.29 m[IU]/L

## 2024-01-24 LAB — COMPREHENSIVE METABOLIC PANEL WITH GFR
AG Ratio: 1.4 (calc) (ref 1.0–2.5)
ALT: 12 U/L (ref 6–29)
AST: 14 U/L (ref 10–35)
Albumin: 4.1 g/dL (ref 3.6–5.1)
Alkaline phosphatase (APISO): 47 U/L (ref 37–153)
BUN: 13 mg/dL (ref 7–25)
CO2: 32 mmol/L (ref 20–32)
Calcium: 9.3 mg/dL (ref 8.6–10.4)
Chloride: 102 mmol/L (ref 98–110)
Creat: 0.82 mg/dL (ref 0.50–1.03)
Globulin: 2.9 g/dL (ref 1.9–3.7)
Glucose, Bld: 156 mg/dL — ABNORMAL HIGH (ref 65–99)
Potassium: 4.5 mmol/L (ref 3.5–5.3)
Sodium: 139 mmol/L (ref 135–146)
Total Bilirubin: 0.4 mg/dL (ref 0.2–1.2)
Total Protein: 7 g/dL (ref 6.1–8.1)
eGFR: 85 mL/min/1.73m2 (ref >=60)

## 2024-01-24 LAB — LIPID PANEL
Cholesterol: 157 mg/dL (ref <200)
HDL: 61 mg/dL (ref >=50)
LDL Cholesterol (Calc): 81 mg/dL
Non-HDL Cholesterol (Calc): 96 mg/dL (ref <130)
Total CHOL/HDL Ratio: 2.6 (calc) (ref <5.0)
Triglycerides: 66 mg/dL (ref <150)

## 2024-01-24 MED ORDER — OLMESARTAN MEDOXOMIL 20 MG PO TABS: 20.0000 mg | ORAL_TABLET | Freq: Every day | ORAL | 0 refills | Status: DC

## 2024-01-24 MED ORDER — DEXCOM G7 SENSOR MISC: 1.0000 | 1 refills | Status: AC

## 2024-01-24 NOTE — Patient Instructions (Signed)
 Hypertension, Adult Hypertension is another name for high blood pressure. High blood pressure forces your heart to work harder to pump blood. This can cause problems over time. There are two numbers in a blood pressure reading. There is a top number (systolic) over a bottom number (diastolic). It is best to have a blood pressure that is below 120/80. What are the causes? The cause of this condition is not known. Some other conditions can lead to high blood pressure. What increases the risk? Some lifestyle factors can make you more likely to develop high blood pressure: Smoking. Not getting enough exercise or physical activity. Being overweight. Having too much fat, sugar, calories, or salt (sodium) in your diet. Drinking too much alcohol. Other risk factors include: Having any of these conditions: Heart disease. Diabetes. High cholesterol. Kidney disease. Obstructive sleep apnea. Having a family history of high blood pressure and high cholesterol. Age. The risk increases with age. Stress. What are the signs or symptoms? High blood pressure may not cause symptoms. Very high blood pressure (hypertensive crisis) may cause: Headache. Fast or uneven heartbeats (palpitations). Shortness of breath. Nosebleed. Vomiting or feeling like you may vomit (nauseous). Changes in how you see. Very bad chest pain. Feeling dizzy. Seizures. How is this treated? This condition is treated by making healthy lifestyle changes, such as: Eating healthy foods. Exercising more. Drinking less alcohol. Your doctor may prescribe medicine if lifestyle changes do not help enough and if: Your top number is above 130. Your bottom number is above 80. Your personal target blood pressure may vary. Follow these instructions at home: Eating and drinking  If told, follow the DASH eating plan. To follow this plan: Fill one half of your plate at each meal with fruits and vegetables. Fill one fourth of your plate  at each meal with whole grains. Whole grains include whole-wheat pasta, brown rice, and whole-grain bread. Eat or drink low-fat dairy products, such as skim milk or low-fat yogurt. Fill one fourth of your plate at each meal with low-fat (lean) proteins. Low-fat proteins include fish, chicken without skin, eggs, beans, and tofu. Avoid fatty meat, cured and processed meat, or chicken with skin. Avoid pre-made or processed food. Limit the amount of salt in your diet to less than 1,500 mg each day. Do not drink alcohol if: Your doctor tells you not to drink. You are pregnant, may be pregnant, or are planning to become pregnant. If you drink alcohol: Limit how much you have to: 0-1 drink a day for women. 0-2 drinks a day for men. Know how much alcohol is in your drink. In the [place] one drink equals one 12 oz bottle of beer (355 mL), one 5 oz glass of wine (148 mL), or one 1 oz glass of hard liquor (44 mL). Lifestyle  Work with your doctor to stay at a healthy weight or to lose weight. Ask your doctor what the best weight is for you. Get at least 30 minutes of exercise that causes your heart to beat faster (aerobic exercise) most days of the week. This may include walking, swimming, or biking. Get at least 30 minutes of exercise that strengthens your muscles (resistance exercise) at least 3 days a week. This may include lifting weights or doing Pilates. Do not smoke or use any products that contain nicotine or tobacco. If you need help quitting, ask your doctor. Check your blood pressure at home as told by your doctor. Keep all follow-up visits. Medicines Take over-the-counter and prescription medicines  only as told by your doctor. Follow directions carefully. Do not skip doses of blood pressure medicine. The medicine does not work as well if you skip doses. Skipping doses also puts you at risk for problems. Ask your doctor about side effects or reactions to medicines that you should watch  for. Contact a doctor if: You think you are having a reaction to the medicine you are taking. You have headaches that keep coming back. You feel dizzy. You have swelling in your ankles. You have trouble with your vision. Get help right away if: You get a very bad headache. You start to feel mixed up (confused). You feel weak or numb. You feel faint. You have very bad pain in your: Chest. Belly (abdomen). You vomit more than once. You have trouble breathing. These symptoms may be an emergency. Get help right away. Call 911. Do not wait to see if the symptoms will go away. Do not drive yourself to the hospital. Summary Hypertension is another name for high blood pressure. High blood pressure forces your heart to work harder to pump blood. For most people, a normal blood pressure is less than 120/80. Making healthy choices can help lower blood pressure. If your blood pressure does not get lower with healthy choices, you may need to take medicine. This information is not intended to replace advice given to you by your health care provider. Make sure you discuss any questions you have with your health care provider. Document Revised: 12/08/2020 Document Reviewed: 12/08/2020 Elsevier Patient Education  2024 [employer].

## 2024-01-24 NOTE — Progress Notes (Signed)
 Subjective:    Patient ID: Veronica Chandler, female    DOB: 03/01/1971, 53 y.o.   MRN: [id number]  HPI  Patient presents to clinic today for follow-up of chronic conditions.  DM2: Her last A1c was 8.3%, 07/2023.  She is taking lantus  as prescribed.  She has not been checking her sugars lately. Her last eye exam was 05/2022.  She checks her feet routinely.  Flu 12/2023.  Pneumovax 05/2020. Prevnar never. COVID Moderna x3.  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  She is taking tamoxifen  as prescribed.  She follows with oncology.  HTN: Her BP today is 144/84. She is not taking any antihypertensive medication at this time.  ECG from 04/2022 reviewed.  She also reports that she has had 2 episodes of postmenopausal bleeding in the last 2 months.  She was put into menopause by tamoxifen .  She reports she has not had a period in 2 years.  Review of Systems  Past Medical History:  Diagnosis Date   Allergy Little   Anemia Little   Breast cancer in situ    ER+; DCIS right breast   GERD (gastroesophageal reflux disease) One year ago   Medical history non-contributory    Pre-diabetes    Sickle cell anemia (HCC) Little   Type 2 diabetes mellitus (HCC)     Current Outpatient Medications  Medication Sig Dispense Refill   aspirin  EC 81 MG tablet Take 1 tablet (81 mg total) by mouth daily. Swallow whole. 30 tablet 12   fluticasone  (FLONASE ) 50 MCG/ACT nasal spray SPRAY 2 SPRAYS INTO EACH NOSTRIL EVERY DAY 16 mL 2   insulin  glargine (LANTUS ) 100 UNIT/ML Solostar Pen Inject 20 Units into the skin daily. 15 mL 11   levocetirizine (XYZAL ) 5 MG tablet TAKE 1 TABLET BY MOUTH EVERY DAY IN THE EVENING 30 tablet 3   Multiple Vitamin (MULTIVITAMIN WITH MINERALS) TABS tablet Take 1 tablet by mouth daily.     olmesartan  (BENICAR ) 20 MG tablet Take 1 tablet (20 mg total) by mouth daily. 30 tablet 0   Syringe/Needle, Disp, (SYRINGE 3CC/18GX1-1/2) 18G X 1-1/2  3 ML MISC 1 Application by Does not apply route 3 (three) times daily. 30 each 3   tamoxifen  (NOLVADEX ) 20 MG tablet Take 1 tablet (20 mg total) by mouth daily. 90 tablet 1   Continuous Glucose Sensor (DEXCOM G7 SENSOR) MISC 1 Device by Does not apply route every 14 (fourteen) days. 6 each 1   No current facility-administered medications for this visit.    Allergies  Allergen Reactions   Flagyl  [Metronidazole ] Other (See Comments)    Causes blurred vision, dizziness, fainting.     Family History  Problem Relation Age of Onset   Hypertension Mother    Diabetes Father    Prostate cancer Father    Healthy Sister    Healthy Brother    Cancer Maternal Grandmother    Stroke Maternal Grandmother    Diabetes Paternal Grandmother    Glaucoma Paternal Grandmother    Lung cancer Maternal Aunt    Pancreatic cancer Maternal Aunt    Lung cancer Paternal Aunt    Asthma Son    Breast cancer Neg Hx    Colon cancer Neg Hx    Ovarian cancer Neg Hx     Social History   Socioeconomic History   Marital status: Divorced    Spouse name: Not on file   Number of children: Not on file  Years of education: Not on file   Highest education level: Some college, no degree  Occupational History   Not on file  Tobacco Use   Smoking status: Never   Smokeless tobacco: Never  Vaping Use   Vaping status: Never Used  Substance and Sexual Activity   Alcohol use: Not Currently    Alcohol/week: 2.0 standard drinks of alcohol    Types: 2 Standard drinks or equivalent per week    Comment: None now   Drug use: Yes    Types: Marijuana    Comment: occasional   Sexual activity: Yes    Birth control/protection: None  Other Topics Concern   Not on file  Social History Narrative   Not on file   Social Drivers of Health   Financial Resource Strain: Medium Risk (01/20/2024)   Overall Financial Resource Strain (CARDIA)    Difficulty of Paying Living Expenses: Somewhat hard  Food Insecurity: Food  Insecurity Present (01/20/2024)   Hunger Vital Sign    Worried About Running Out of Food in the Last Year: Sometimes true    Ran Out of Food in the Last Year: Sometimes true  Transportation Needs: No Transportation Needs (01/20/2024)   PRAPARE - Administrator, Civil Service (Medical): No    Lack of Transportation (Non-Medical): No  Physical Activity: Sufficiently Active (01/20/2024)   Exercise Vital Sign    Days of Exercise per Week: 6 days    Minutes of Exercise per Session: 30 min  Stress: No Stress Concern Present (01/20/2024)   [employer] of Occupational Health - Occupational Stress Questionnaire    Feeling of Stress: Not at all  Social Connections: Moderately Isolated (01/20/2024)   Social Connection and Isolation Panel    Frequency of Communication with Friends and Family: More than three times a week    Frequency of Social Gatherings with Friends and Family: Once a week    Attends Religious Services: 1 to 4 times per year    Active Member of [employer] or Organizations: No    Attends Engineer, Structural: Not on file    Marital Status: Divorced  Intimate Partner Violence: Not At Risk (04/26/2022)   Humiliation, Afraid, Rape, and Kick questionnaire    Fear of Current or Ex-Partner: No    Emotionally Abused: No    Physically Abused: No    Sexually Abused: No     Constitutional: Denies fever, malaise, fatigue, headache or abrupt weight changes.  HEENT: Denies eye pain, eye redness, ear pain, ringing in the ears, wax buildup, runny nose, nasal congestion, bloody nose, or sore throat. Respiratory: Denies difficulty breathing, shortness of breath, cough or sputum production.   Cardiovascular: Denies chest pain, chest tightness, palpitations or swelling in the hands or feet.  Gastrointestinal: Denies abdominal pain, bloating, constipation, diarrhea or blood in the stool.  GU: Patient reports postmenopausal bleeding.  Denies urgency, frequency, pain with  urination, burning sensation, blood in urine, odor or discharge. Musculoskeletal: Denies decrease in range of motion, difficulty with gait, muscle pain or joint pain and swelling.  Skin: Denies redness, rashes, lesions or ulcercations.  Neurological: Denies dizziness, difficulty with memory, difficulty with speech or problems with balance and coordination.  Psych: Denies anxiety, depression, SI/HI.  No other specific complaints in a complete review of systems (except as listed in HPI above).   Objective:   Physical Exam BP (!) 144/84 (BP Location: Left Arm, Patient Position: Sitting, Cuff Size: Large)   Ht 5' 1 (1.549 m)  Wt 250 lb 9.6 oz (113.7 kg)   LMP 01/03/2024 (Approximate)   BMI 47.35 kg/m    Wt Readings from Last 3 Encounters:  09/19/23 248 lb 9.6 oz (112.8 kg)  07/23/23 251 lb (113.9 kg)  07/18/23 252 lb (114.3 kg)    General: Appears her stated age, obese, in NAD. Skin: Warm, dry and intact. No ulcerations noted. HEENT: Head: normal shape and size; Eyes: sclera white and EOMs intact;  Cardiovascular: Normal rate and rhythm. S1,S2 noted.  No murmur, rubs or gallops noted.  Pulmonary/Chest: Normal effort and positive vesicular breath sounds. No respiratory distress. No wheezes, rales or ronchi noted.  Musculoskeletal:  No difficulty with gait.  Neurological: Alert and oriented.    BMET    Component Value Date/Time   NA 136 09/19/2023 0938   K 4.0 09/19/2023 0938   CL 102 09/19/2023 0938   CO2 25 09/19/2023 0938   GLUCOSE 145 (H) 09/19/2023 0938   BUN 13 09/19/2023 0938   CREATININE 0.70 09/19/2023 0938   CREATININE 0.78 07/23/2023 1335   CALCIUM  9.3 09/19/2023 0938   GFRNONAA >60 09/19/2023 0938    Lipid Panel     Component Value Date/Time   CHOL 161 07/23/2023 1335   TRIG 102 07/23/2023 1335   HDL 56 07/23/2023 1335   CHOLHDL 2.9 07/23/2023 1335   VLDL 17 04/27/2022 0351   LDLCALC 85 07/23/2023 1335    CBC    Component Value Date/Time   WBC  4.5 09/19/2023 0938   WBC 4.9 07/23/2023 1335   RBC 4.27 09/19/2023 0938   HGB 12.3 09/19/2023 0938   HCT 36.2 09/19/2023 0938   PLT 277 09/19/2023 0938   MCV 84.8 09/19/2023 0938   MCH 28.8 09/19/2023 0938   MCHC 34.0 09/19/2023 0938   RDW 13.0 09/19/2023 0938   LYMPHSABS 1.8 09/19/2023 0938   MONOABS 0.3 09/19/2023 0938   EOSABS 0.1 09/19/2023 0938   BASOSABS 0.0 09/19/2023 0938    Hgb A1C Lab Results  Component Value Date   HGBA1C 8.3 (H) 07/23/2023           Assessment & Plan:   Postmenopausal bleeding:  Will obtain pelvic/transvaginal ultrasound for further evaluation  RTC in 2 weeks follow-up HTN, 3 months, follow-up chronic conditions Angeline Laura, NP

## 2024-01-24 NOTE — Assessment & Plan Note (Signed)
 Complicated by morbid obesity Will start olmesartan  20 mg daily Reinforced DASH diet and exercise for weight loss C-Met today

## 2024-01-24 NOTE — Assessment & Plan Note (Signed)
 Encouraged diet and exercise for weight loss ?

## 2024-01-24 NOTE — Assessment & Plan Note (Signed)
 In remission Continue tamoxifen  20 mg daily She follows with oncology yearly

## 2024-01-24 NOTE — Assessment & Plan Note (Signed)
 Complicated by morbid obesity A1c today Urine microalbumin has been checked within the last year Continue lantus  20 units at bedtime Encourage low-carb diet and exercise for weight loss Encourage routine eye exams Encourage routine foot exams Immunizations UTD

## 2024-01-27 ENCOUNTER — Ambulatory Visit: Payer: Self-pay | Admitting: Internal Medicine

## 2024-01-29 ENCOUNTER — Ambulatory Visit

## 2024-02-14 ENCOUNTER — Encounter: Payer: Self-pay | Admitting: Internal Medicine

## 2024-02-14 ENCOUNTER — Ambulatory Visit: Admitting: Internal Medicine

## 2024-02-14 VITALS — BP 134/74 | Ht 61.0 in | Wt 253.4 lb

## 2024-02-14 DIAGNOSIS — I152 Hypertension secondary to endocrine disorders: Secondary | ICD-10-CM | POA: Diagnosis not present

## 2024-02-14 DIAGNOSIS — Z6841 Body Mass Index (BMI) 40.0 and over, adult: Secondary | ICD-10-CM | POA: Diagnosis not present

## 2024-02-14 DIAGNOSIS — Z794 Long term (current) use of insulin: Secondary | ICD-10-CM | POA: Diagnosis not present

## 2024-02-14 DIAGNOSIS — E1159 Type 2 diabetes mellitus with other circulatory complications: Secondary | ICD-10-CM | POA: Diagnosis not present

## 2024-02-14 DIAGNOSIS — E66813 Obesity, class 3: Secondary | ICD-10-CM | POA: Diagnosis not present

## 2024-02-14 NOTE — Progress Notes (Signed)
 Subjective:    Patient ID: Veronica Chandler, female    DOB: [birth year], 53 y.o.   MRN: [id number]  HPI  Patient presents to clinic today for 2-week follow-up of HTN.  At her last visit she was started on olmesartan  20 mg daily.  She has been taking medication as prescribed.  She denies adverse side effects.  Her BP today is 134/74.  Review of Systems  Past Medical History:  Diagnosis Date   Allergy Little   Anemia Little   Breast cancer in situ    ER+; DCIS right breast   GERD (gastroesophageal reflux disease) One year ago   Medical history non-contributory    Pre-diabetes    Sickle cell anemia (HCC) Little   Type 2 diabetes mellitus (HCC)     Current Outpatient Medications  Medication Sig Dispense Refill   aspirin  EC 81 MG tablet Take 1 tablet (81 mg total) by mouth daily. Swallow whole. 30 tablet 12   Continuous Glucose Sensor (DEXCOM G7 SENSOR) MISC 1 Device by Does not apply route every 14 (fourteen) days. 6 each 1   fluticasone  (FLONASE ) 50 MCG/ACT nasal spray SPRAY 2 SPRAYS INTO EACH NOSTRIL EVERY DAY 16 mL 2   insulin  glargine (LANTUS ) 100 UNIT/ML Solostar Pen Inject 20 Units into the skin daily. 15 mL 11   levocetirizine (XYZAL ) 5 MG tablet TAKE 1 TABLET BY MOUTH EVERY DAY IN THE EVENING 30 tablet 3   Multiple Vitamin (MULTIVITAMIN WITH MINERALS) TABS tablet Take 1 tablet by mouth daily.     olmesartan  (BENICAR ) 20 MG tablet Take 1 tablet (20 mg total) by mouth daily. 30 tablet 0   Syringe/Needle, Disp, (SYRINGE 3CC/18GX1-1/2) 18G X 1-1/2 3 ML MISC 1 Application by Does not apply route 3 (three) times daily. 30 each 3   tamoxifen  (NOLVADEX ) 20 MG tablet Take 1 tablet (20 mg total) by mouth daily. 90 tablet 1   No current facility-administered medications for this visit.    Allergies  Allergen Reactions   Flagyl  [Metronidazole ] Other (See Comments)    Causes blurred vision, dizziness, fainting.     Family History  Problem Relation Age of Onset    Hypertension Mother    Diabetes Father    Prostate cancer Father    Healthy Sister    Healthy Brother    Cancer Maternal Grandmother    Stroke Maternal Grandmother    Diabetes Paternal Grandmother    Glaucoma Paternal Grandmother    Lung cancer Maternal Aunt    Pancreatic cancer Maternal Aunt    Lung cancer Paternal Aunt    Asthma Son    Breast cancer Neg Hx    Colon cancer Neg Hx    Ovarian cancer Neg Hx     Social History   Socioeconomic History   Marital status: Divorced    Spouse name: Not on file   Number of children: Not on file   Years of education: Not on file   Highest education level: Some college, no degree  Occupational History   Not on file  Tobacco Use   Smoking status: Never   Smokeless tobacco: Never  Vaping Use   Vaping status: Never Used  Substance and Sexual Activity   Alcohol use: Not Currently    Alcohol/week: 2.0 standard drinks of alcohol    Types: 2 Standard drinks or equivalent per week    Comment: None now   Drug use: Yes    Types: Marijuana    Comment: occasional  Sexual activity: Yes    Birth control/protection: None  Other Topics Concern   Not on file  Social History Narrative   Not on file   Social Drivers of Health   Tobacco Use: Low Risk (01/24/2024)   Patient History    Smoking Tobacco Use: Never    Smokeless Tobacco Use: Never    Passive Exposure: Not on file  Financial Resource Strain: Medium Risk (01/20/2024)   Overall Financial Resource Strain (CARDIA)    Difficulty of Paying Living Expenses: Somewhat hard  Food Insecurity: Food Insecurity Present (01/20/2024)   Epic    Worried About Programme Researcher, Broadcasting/film/video in the Last Year: Sometimes true    Ran Out of Food in the Last Year: Sometimes true  Transportation Needs: No Transportation Needs (01/20/2024)   Epic    Lack of Transportation (Medical): No    Lack of Transportation (Non-Medical): No  Physical Activity: Sufficiently Active (01/20/2024)   Exercise Vital Sign     Days of Exercise per Week: 6 days    Minutes of Exercise per Session: 30 min  Stress: No Stress Concern Present (01/20/2024)   [employer] of Occupational Health - Occupational Stress Questionnaire    Feeling of Stress: Not at all  Social Connections: Moderately Isolated (01/20/2024)   Social Connection and Isolation Panel    Frequency of Communication with Friends and Family: More than three times a week    Frequency of Social Gatherings with Friends and Family: Once a week    Attends Religious Services: 1 to 4 times per year    Active Member of [employer] or Organizations: No    Attends Engineer, Structural: Not on file    Marital Status: Divorced  Intimate Partner Violence: Not At Risk (04/26/2022)   Humiliation, Afraid, Rape, and Kick questionnaire    Fear of Current or Ex-Partner: No    Emotionally Abused: No    Physically Abused: No    Sexually Abused: No  Depression (PHQ2-9): Low Risk (01/24/2024)   Depression (PHQ2-9)    PHQ-2 Score: 1  Alcohol Screen: Low Risk (01/20/2024)   Alcohol Screen    Last Alcohol Screening Score (AUDIT): 3  Housing: High Risk (01/20/2024)   Epic    Unable to Pay for Housing in the Last Year: Yes    Number of Times Moved in the Last Year: 0    Homeless in the Last Year: No  Utilities: Not At Risk (04/26/2022)   AHC Utilities    Threatened with loss of utilities: No  Health Literacy: Not on file     Constitutional: Denies fever, malaise, fatigue, headache or abrupt weight changes.  HEENT: Denies eye pain, eye redness, ear pain, ringing in the ears, wax buildup, runny nose, nasal congestion, bloody nose, or sore throat. Respiratory: Denies difficulty breathing, shortness of breath, cough or sputum production.   Cardiovascular: Denies chest pain, chest tightness, palpitations or swelling in the hands or feet.  Musculoskeletal: Denies decrease in range of motion, difficulty with gait, muscle pain or joint pain and swelling.  Skin: Denies  redness, rashes, lesions or ulcercations.  Neurological: Denies dizziness, difficulty with memory, difficulty with speech or problems with balance and coordination.   No other specific complaints in a complete review of systems (except as listed in HPI above).   Objective:   Physical Exam BP 134/74 (BP Location: Right Arm, Patient Position: Sitting, Cuff Size: Large)   Ht 5' 1 (1.549 m)   Wt 253 lb 6.4 oz (114.9  kg)   LMP 01/03/2024 (Approximate)   BMI 47.88 kg/m     Wt Readings from Last 3 Encounters:  01/24/24 250 lb 9.6 oz (113.7 kg)  09/19/23 248 lb 9.6 oz (112.8 kg)  07/23/23 251 lb (113.9 kg)    General: Appears her stated age, obese, in NAD. HEENT: Head: normal shape and size; Eyes: sclera white and EOMs intact;  Cardiovascular: Normal rate and rhythm. S1,S2 noted.  No murmur, rubs or gallops noted.  Pulmonary/Chest: Normal effort and positive vesicular breath sounds. No respiratory distress. No wheezes, rales or ronchi noted.  Musculoskeletal:  No difficulty with gait.  Neurological: Alert and oriented.    BMET    Component Value Date/Time   NA 139 01/24/2024 0855   K 4.5 01/24/2024 0855   CL 102 01/24/2024 0855   CO2 32 01/24/2024 0855   GLUCOSE 156 (H) 01/24/2024 0855   BUN 13 01/24/2024 0855   CREATININE 0.82 01/24/2024 0855   CALCIUM  9.3 01/24/2024 0855   GFRNONAA >60 09/19/2023 0938    Lipid Panel     Component Value Date/Time   CHOL 157 01/24/2024 0855   TRIG 66 01/24/2024 0855   HDL 61 01/24/2024 0855   CHOLHDL 2.6 01/24/2024 0855   VLDL 17 04/27/2022 0351   LDLCALC 81 01/24/2024 0855    CBC    Component Value Date/Time   WBC 3.9 01/24/2024 0855   RBC 4.10 01/24/2024 0855   HGB 12.1 01/24/2024 0855   HGB 12.3 09/19/2023 0938   HCT 37.0 01/24/2024 0855   PLT 293 01/24/2024 0855   PLT 277 09/19/2023 0938   MCV 90.2 01/24/2024 0855   MCH 29.5 01/24/2024 0855   MCHC 32.7 01/24/2024 0855   RDW 13.5 01/24/2024 0855   LYMPHSABS 1.8  09/19/2023 0938   MONOABS 0.3 09/19/2023 0938   EOSABS 0.1 09/19/2023 0938   BASOSABS 0.0 09/19/2023 0938    Hgb A1C Lab Results  Component Value Date   HGBA1C 7.5 (H) 01/24/2024           Assessment & Plan:     RTC in 5 months, for your annual exam Angeline Laura, NP

## 2024-02-14 NOTE — Patient Instructions (Signed)
 Hypertension, Adult Hypertension is another name for high blood pressure. High blood pressure forces your heart to work harder to pump blood. This can cause problems over time. There are two numbers in a blood pressure reading. There is a top number (systolic) over a bottom number (diastolic). It is best to have a blood pressure that is below 120/80. What are the causes? The cause of this condition is not known. Some other conditions can lead to high blood pressure. What increases the risk? Some lifestyle factors can make you more likely to develop high blood pressure: Smoking. Not getting enough exercise or physical activity. Being overweight. Having too much fat, sugar, calories, or salt (sodium) in your diet. Drinking too much alcohol. Other risk factors include: Having any of these conditions: Heart disease. Diabetes. High cholesterol. Kidney disease. Obstructive sleep apnea. Having a family history of high blood pressure and high cholesterol. Age. The risk increases with age. Stress. What are the signs or symptoms? High blood pressure may not cause symptoms. Very high blood pressure (hypertensive crisis) may cause: Headache. Fast or uneven heartbeats (palpitations). Shortness of breath. Nosebleed. Vomiting or feeling like you may vomit (nauseous). Changes in how you see. Very bad chest pain. Feeling dizzy. Seizures. How is this treated? This condition is treated by making healthy lifestyle changes, such as: Eating healthy foods. Exercising more. Drinking less alcohol. Your doctor may prescribe medicine if lifestyle changes do not help enough and if: Your top number is above 130. Your bottom number is above 80. Your personal target blood pressure may vary. Follow these instructions at home: Eating and drinking  If told, follow the DASH eating plan. To follow this plan: Fill one half of your plate at each meal with fruits and vegetables. Fill one fourth of your plate  at each meal with whole grains. Whole grains include whole-wheat pasta, brown rice, and whole-grain bread. Eat or drink low-fat dairy products, such as skim milk or low-fat yogurt. Fill one fourth of your plate at each meal with low-fat (lean) proteins. Low-fat proteins include fish, chicken without skin, eggs, beans, and tofu. Avoid fatty meat, cured and processed meat, or chicken with skin. Avoid pre-made or processed food. Limit the amount of salt in your diet to less than 1,500 mg each day. Do not drink alcohol if: Your doctor tells you not to drink. You are pregnant, may be pregnant, or are planning to become pregnant. If you drink alcohol: Limit how much you have to: 0-1 drink a day for women. 0-2 drinks a day for men. Know how much alcohol is in your drink. In the [place] one drink equals one 12 oz bottle of beer (355 mL), one 5 oz glass of wine (148 mL), or one 1 oz glass of hard liquor (44 mL). Lifestyle  Work with your doctor to stay at a healthy weight or to lose weight. Ask your doctor what the best weight is for you. Get at least 30 minutes of exercise that causes your heart to beat faster (aerobic exercise) most days of the week. This may include walking, swimming, or biking. Get at least 30 minutes of exercise that strengthens your muscles (resistance exercise) at least 3 days a week. This may include lifting weights or doing Pilates. Do not smoke or use any products that contain nicotine or tobacco. If you need help quitting, ask your doctor. Check your blood pressure at home as told by your doctor. Keep all follow-up visits. Medicines Take over-the-counter and prescription medicines  only as told by your doctor. Follow directions carefully. Do not skip doses of blood pressure medicine. The medicine does not work as well if you skip doses. Skipping doses also puts you at risk for problems. Ask your doctor about side effects or reactions to medicines that you should watch  for. Contact a doctor if: You think you are having a reaction to the medicine you are taking. You have headaches that keep coming back. You feel dizzy. You have swelling in your ankles. You have trouble with your vision. Get help right away if: You get a very bad headache. You start to feel mixed up (confused). You feel weak or numb. You feel faint. You have very bad pain in your: Chest. Belly (abdomen). You vomit more than once. You have trouble breathing. These symptoms may be an emergency. Get help right away. Call 911. Do not wait to see if the symptoms will go away. Do not drive yourself to the hospital. Summary Hypertension is another name for high blood pressure. High blood pressure forces your heart to work harder to pump blood. For most people, a normal blood pressure is less than 120/80. Making healthy choices can help lower blood pressure. If your blood pressure does not get lower with healthy choices, you may need to take medicine. This information is not intended to replace advice given to you by your health care provider. Make sure you discuss any questions you have with your health care provider. Document Revised: 12/08/2020 Document Reviewed: 12/08/2020 Elsevier Patient Education  2024 [employer].

## 2024-02-14 NOTE — Assessment & Plan Note (Signed)
 Complicated by morbid obesity Continue olmesartan  20 mg daily Reinforced DASH diet and exercise for weight loss

## 2024-02-14 NOTE — Assessment & Plan Note (Signed)
 Encouraged diet and exercise for weight loss ?

## 2024-02-21 ENCOUNTER — Other Ambulatory Visit: Payer: Self-pay | Admitting: Internal Medicine

## 2024-02-22 ENCOUNTER — Other Ambulatory Visit: Payer: Self-pay | Admitting: Internal Medicine

## 2024-02-25 NOTE — Telephone Encounter (Signed)
 Requested Prescriptions  Pending Prescriptions Disp Refills   olmesartan  (BENICAR ) 20 MG tablet [Pharmacy Med Name: OLMESARTAN  MEDOXOMIL 20 MG TAB] 90 tablet 1    Sig: TAKE 1 TABLET BY MOUTH EVERY DAY     Cardiovascular:  Angiotensin Receptor Blockers Passed - 02/25/2024 10:30 AM      Passed - Cr in normal range and within 180 days    Creat  Date Value Ref Range Status  01/24/2024 0.82 0.50 - 1.03 mg/dL Final   Creatinine, Urine  Date Value Ref Range Status  07/23/2023 48 20 - 275 mg/dL Final         Passed - K in normal range and within 180 days    Potassium  Date Value Ref Range Status  01/24/2024 4.5 3.5 - 5.3 mmol/L Final         Passed - Patient is not pregnant      Passed - Last BP in normal range    BP Readings from Last 1 Encounters:  02/14/24 134/74         Passed - Valid encounter within last 6 months    Recent Outpatient Visits           1 week ago Hypertension associated with diabetes [facility])   [place] [facility] [place], [place] W, NP   1 month ago Type 2 diabetes mellitus with hyperglycemia, with long-term current use of insulin  [facility])   [place] [facility] [place], Angeline ORN, NP   7 months ago Encounter for general adult medical examination with abnormal findings   [place] [facility], Angeline ORN, NP

## 2024-02-25 NOTE — Telephone Encounter (Signed)
 Refill sent in on 02/25/24.   Copied from CRM #[id number]. Topic: Clinical - Prescription Issue >> Feb 25, 2024  9:29 AM Darshell M wrote: Reason for CRM: Patient calling for status on refill called in 2/20. Refill still pending. Patient has one pill left. Patient CB#Mobile [phone].  [id number], [place] - [address] (Pharmacy) [phone]

## 2024-02-25 NOTE — Telephone Encounter (Signed)
 Requested Prescriptions  Pending Prescriptions Disp Refills   fluticasone  (FLONASE ) 50 MCG/ACT nasal spray [Pharmacy Med Name: FLUTICASONE  PROP 50 MCG SPRAY] 16 mL 3    Sig: SPRAY 2 SPRAYS INTO EACH NOSTRIL EVERY DAY     Ear, Nose, and Throat: Nasal Preparations - Corticosteroids Passed - 02/25/2024 10:30 AM      Passed - Valid encounter within last 12 months    Recent Outpatient Visits           1 week ago Hypertension associated with diabetes [facility])   [place] [facility] [place], [place] W, NP   1 month ago Type 2 diabetes mellitus with hyperglycemia, with long-term current use of insulin  [place] [facility])   [place] [facility] [place], Angeline ORN, NP   7 months ago Encounter for general adult medical examination with abnormal findings   [place] [facility] [place], Angeline ORN, NP

## 2024-03-01 ENCOUNTER — Other Ambulatory Visit: Payer: Self-pay | Admitting: Internal Medicine

## 2024-03-03 ENCOUNTER — Ambulatory Visit

## 2024-03-03 ENCOUNTER — Other Ambulatory Visit: Payer: Self-pay | Admitting: Internal Medicine

## 2024-03-26 ENCOUNTER — Inpatient Hospital Stay: Attending: Oncology

## 2024-03-26 ENCOUNTER — Inpatient Hospital Stay (HOSPITAL_BASED_OUTPATIENT_CLINIC_OR_DEPARTMENT_OTHER): Admitting: Oncology

## 2024-03-26 ENCOUNTER — Encounter: Payer: Self-pay | Admitting: Oncology

## 2024-03-26 ENCOUNTER — Ambulatory Visit: Payer: Self-pay | Admitting: Oncology

## 2024-03-26 ENCOUNTER — Other Ambulatory Visit: Payer: Self-pay

## 2024-03-26 VITALS — BP 158/68 | HR 64 | Temp 97.0°F | Resp 18 | Wt 253.0 lb

## 2024-03-26 DIAGNOSIS — Z79810 Long term (current) use of selective estrogen receptor modulators (SERMs): Secondary | ICD-10-CM | POA: Diagnosis not present

## 2024-03-26 DIAGNOSIS — Z800 Family history of malignant neoplasm of digestive organs: Secondary | ICD-10-CM | POA: Diagnosis not present

## 2024-03-26 DIAGNOSIS — D571 Sickle-cell disease without crisis: Secondary | ICD-10-CM | POA: Diagnosis not present

## 2024-03-26 DIAGNOSIS — E119 Type 2 diabetes mellitus without complications: Secondary | ICD-10-CM | POA: Diagnosis not present

## 2024-03-26 DIAGNOSIS — Z801 Family history of malignant neoplasm of trachea, bronchus and lung: Secondary | ICD-10-CM | POA: Diagnosis not present

## 2024-03-26 DIAGNOSIS — Z59868 Other specified financial insecurity: Secondary | ICD-10-CM | POA: Insufficient documentation

## 2024-03-26 DIAGNOSIS — Z9011 Acquired absence of right breast and nipple: Secondary | ICD-10-CM | POA: Insufficient documentation

## 2024-03-26 DIAGNOSIS — Z7982 Long term (current) use of aspirin: Secondary | ICD-10-CM | POA: Insufficient documentation

## 2024-03-26 DIAGNOSIS — Z8249 Family history of ischemic heart disease and other diseases of the circulatory system: Secondary | ICD-10-CM | POA: Insufficient documentation

## 2024-03-26 DIAGNOSIS — Z833 Family history of diabetes mellitus: Secondary | ICD-10-CM | POA: Diagnosis not present

## 2024-03-26 DIAGNOSIS — D509 Iron deficiency anemia, unspecified: Secondary | ICD-10-CM

## 2024-03-26 DIAGNOSIS — Z923 Personal history of irradiation: Secondary | ICD-10-CM | POA: Insufficient documentation

## 2024-03-26 DIAGNOSIS — Z853 Personal history of malignant neoplasm of breast: Secondary | ICD-10-CM | POA: Diagnosis not present

## 2024-03-26 DIAGNOSIS — L700 Acne vulgaris: Secondary | ICD-10-CM | POA: Insufficient documentation

## 2024-03-26 DIAGNOSIS — Z825 Family history of asthma and other chronic lower respiratory diseases: Secondary | ICD-10-CM | POA: Diagnosis not present

## 2024-03-26 DIAGNOSIS — Z823 Family history of stroke: Secondary | ICD-10-CM | POA: Insufficient documentation

## 2024-03-26 DIAGNOSIS — D0511 Intraductal carcinoma in situ of right breast: Secondary | ICD-10-CM | POA: Diagnosis present

## 2024-03-26 DIAGNOSIS — Z79899 Other long term (current) drug therapy: Secondary | ICD-10-CM | POA: Diagnosis not present

## 2024-03-26 DIAGNOSIS — Z83511 Family history of glaucoma: Secondary | ICD-10-CM | POA: Insufficient documentation

## 2024-03-26 DIAGNOSIS — Z881 Allergy status to other antibiotic agents status: Secondary | ICD-10-CM | POA: Insufficient documentation

## 2024-03-26 LAB — CBC WITH DIFFERENTIAL (CANCER CENTER ONLY)
Abs Immature Granulocytes: 0.01 K/uL (ref 0.00–0.07)
Basophils Absolute: 0 K/uL (ref 0.0–0.1)
Basophils Relative: 1 %
Eosinophils Absolute: 0.1 K/uL (ref 0.0–0.5)
Eosinophils Relative: 2 %
HCT: 33.5 % — ABNORMAL LOW (ref 36.0–46.0)
Hemoglobin: 11.7 g/dL — ABNORMAL LOW (ref 12.0–15.0)
Immature Granulocytes: 0 %
Lymphocytes Relative: 44 %
Lymphs Abs: 1.8 K/uL (ref 0.7–4.0)
MCH: 29.5 pg (ref 26.0–34.0)
MCHC: 34.9 g/dL (ref 30.0–36.0)
MCV: 84.6 fL (ref 80.0–100.0)
Monocytes Absolute: 0.2 K/uL (ref 0.1–1.0)
Monocytes Relative: 6 %
Neutro Abs: 1.9 K/uL (ref 1.7–7.7)
Neutrophils Relative %: 47 %
Platelet Count: 277 K/uL (ref 150–400)
RBC: 3.96 MIL/uL (ref 3.87–5.11)
RDW: 13.1 % (ref 11.5–15.5)
WBC Count: 4.1 K/uL (ref 4.0–10.5)
nRBC: 0 % (ref 0.0–0.2)

## 2024-03-26 LAB — CMP (CANCER CENTER ONLY)
ALT: 14 U/L (ref 0–44)
AST: 16 U/L (ref 15–41)
Albumin: 4.2 g/dL (ref 3.5–5.0)
Alkaline Phosphatase: 62 U/L (ref 38–126)
Anion gap: 9 (ref 5–15)
BUN: 11 mg/dL (ref 6–20)
CO2: 29 mmol/L (ref 22–32)
Calcium: 10 mg/dL (ref 8.9–10.3)
Chloride: 102 mmol/L (ref 98–111)
Creatinine: 0.73 mg/dL (ref 0.44–1.00)
GFR, Estimated: >60 mL/min
Glucose, Bld: 161 mg/dL — ABNORMAL HIGH (ref 70–99)
Potassium: 4.3 mmol/L (ref 3.5–5.1)
Sodium: 139 mmol/L (ref 135–145)
Total Bilirubin: 0.3 mg/dL (ref 0.0–1.2)
Total Protein: 7.5 g/dL (ref 6.5–8.1)

## 2024-03-26 LAB — IRON AND TIBC
Iron: 80 ug/dL (ref 28–170)
Saturation Ratios: 23 % (ref 10.4–31.8)
TIBC: 351 ug/dL (ref 250–450)
UIBC: 272 ug/dL

## 2024-03-26 LAB — FERRITIN: Ferritin: 177 ng/mL (ref 11–307)

## 2024-03-26 MED ORDER — TAMOXIFEN CITRATE 20 MG PO TABS: 20.0000 mg | ORAL_TABLET | Freq: Every day | ORAL | 1 refills | Status: AC

## 2024-03-26 NOTE — Assessment & Plan Note (Addendum)
 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 Labs are reviewed and discussed with patient. Continue  Tamoxifen  20 mg daily[started on 07/27/2023]  She is on Aspirin  81mg  daily. annual diagnostic mammogram -Oct 2025 results were reviewed.

## 2024-03-26 NOTE — Progress Notes (Signed)
 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 Labs are reviewed and discussed with patient. Continue  Tamoxifen  20 mg daily[started on 07/27/2023]  She is on Aspirin  81mg  daily. annual diagnostic mammogram -Oct 2025 results were reviewed.   Long-term current use of tamoxifen  Follow up with gynecology evaluation annually.   Orders Placed This Encounter  Procedures   CBC with Differential (Cancer Center Only)    Standing Status:   Future    Expected Date:   09/23/2024    Expiration Date:   12/22/2024   CMP (Cancer Center only)    Standing Status:   Future    Expected Date:   09/23/2024    Expiration Date:   12/22/2024   Estradiol    Standing Status:   Future    Expected Date:   09/23/2024    Expiration Date:   12/22/2024   Follicle stimulating hormone    Standing Status:   Future    Expected Date:   09/23/2024    Expiration Date:   12/22/2024   Ferritin    Standing Status:   Future    Number of Occurrences:   1    Expected Date:   03/26/2024    Expiration Date:   06/24/2024   Iron and TIBC    Standing Status:   Future    Number of Occurrences:   1    Expected Date:   03/26/2024    Expiration Date:   06/24/2024    Follow up in 6 months.  All questions were answered. The patient knows to call the clinic with any problems, questions or concerns.  Call Babara, MD,  PhD [facility] Hematology Oncology 03/26/2024    HISTORY OF PRESENTING ILLNESS:  Veronica Chandler 54 y.o. female presents to establish care for DCIS of right breast I have reviewed her chart and materials related to her cancer extensively and collaborated history with the patient. Summary of oncologic history is as follows: Oncology History  History of breast cancer  12/07/2021 Mammogram   Bilateral screen mammogram showed  In the right breast, calcifications warrant further evaluation with magnified views. In the left breast, no findings suspicious for malignancy   01/11/2022 Imaging   Unilateral right diagnostic mammogram showed . A 17 mm group of calcifications in the upper outer RIGHT breast at middle depth are indeterminate. Recommend stereotactic guided biopsy for definitive characterization.    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  Pathology showed high grade DCIS, comedo type. Calcifications associated with DCIS.    Menarche at 54 yo Age at first child born 36 yo No previous OCP use No previous chest radiation, or breast biopsy.  Family history positive for father with prostate cancer, paternal aunt with lung cancer.    02/05/2022 Cancer Staging   Staging  form: Breast, AJCC 8th Edition - Clinical stage from 02/05/2022: Stage 0 (cTis (DCIS), cN0, cM0, ER: Not Assessed, PR: Not Assessed, HER2: Not Assessed) - Signed by Babara Call, MD on 03/22/2022 Stage prefix: Initial diagnosis Nuclear grade: G3   02/19/2022 Surgery   S/p right lumpecotmy + SLNB  Pathology showed DCIS, intermediate grade, background benign mammary parenchyma with fibrocystic and apocrine changes and fibroadenomatoid changes.  Negative for invasive carcinoma. 2 sentinel lymph nodes were harvested and both were negative for malignancy.   Estrogen receptor positive -90%    03/01/2022 Cancer Staging   Staging  form: Breast, AJCC 8th Edition - Pathologic stage from 03/01/2022: pTis (DCIS), pN0, G2, ER+, PR: Not Assessed, HER2: Not Assessed - Signed by Babara Call, MD on 03/22/2022 Stage prefix: Initial diagnosis Nuclear grade: G2 Histologic grading system: 3 grade system   04/05/2022 - 05/31/2022 Radiation Therapy   Adjuvant breast radiation.    07/27/2022 -  Anti-estrogen oral therapy   Started on tamoxifen  20 mg daily.    Patient is status post lumpectomy.  She presents to discuss pathology results and future management plan.  Denies no new complaints.  INTERVAL HISTORY Veronica Chandler is a 53 y.o. female who has above history reviewed by me today presents for follow up visit for right breast DCIS S/p Radiation.  She started on tamoxifen  20 mg daily since May 2024.  She tolerates it.  Manageable side effects  MEDICAL HISTORY:  Past Medical History:  Diagnosis Date   Allergy Little   Anemia Little   Breast cancer in situ    ER+; DCIS right breast   GERD (gastroesophageal reflux disease) One year ago   Medical history non-contributory    Pre-diabetes    Sickle cell anemia (HCC) Little   Type 2 diabetes mellitus (HCC)     SURGICAL HISTORY: Past Surgical History:  Procedure Laterality Date   BREAST BIOPSY Right 02/01/2022   stereo bx, calcs, X clip-DCIS   BREAST BIOPSY Right 02/01/2022   MM RT BREAST BX W LOC DEV 1ST LESION IMAGE BX SPEC STEREO GUIDE 02/01/2022 [facility]   BREAST BIOPSY Right 02/14/2022   MM RT RADIO FREQUENCY TAG LOC MAMMO GUIDE 02/14/2022 [facility]   COLONOSCOPY WITH PROPOFOL  N/A 06/16/2020   Procedure: COLONOSCOPY WITH PROPOFOL ;  Surgeon: Therisa Bi, MD;  Location: [facility] ENDOSCOPY;  Service: Gastroenterology;  Laterality: N/A;   NO PAST SURGERIES     PART MASTECTOMY,RADIO FREQUENCY LOCALIZER,AXILLARY SENTINEL NODE BIOPSY Right 03/01/2022   Procedure: PART MASTECTOMY,RADIO FREQUENCY LOCALIZER,AXILLARY SENTINEL NODE BIOPSY;  Surgeon: Tye Millet, DO;  Location: [facility] ORS;  Service: General;  Laterality: Right;    SOCIAL HISTORY: Social History   Socioeconomic History   Marital status: Divorced    Spouse name: Not on file   Number of children: Not on file   Years of education: Not on file   Highest education level: Some college, no degree  Occupational History   Not on file  Tobacco Use   Smoking status: Never   Smokeless tobacco: Never  Vaping Use   Vaping status: Never Used  Substance and Sexual Activity   Alcohol use: Not Currently    Alcohol/week: 2.0 standard drinks of alcohol    Types: 2 Standard drinks or equivalent per week    Comment: None now   Drug use: Yes    Types: Marijuana    Comment: occasional   Sexual activity: Yes    Birth control/protection: None  Other Topics Concern   Not on  file  Social History Narrative   Not on file   Social Drivers of Health   Tobacco Use: Low Risk (03/26/2024)   Patient History    Smoking Tobacco Use: Never    Smokeless Tobacco Use: Never    Passive Exposure: Not on file  Financial Resource Strain: Medium Risk (01/20/2024)   Overall Financial Resource Strain (CARDIA)    Difficulty of Paying Living Expenses: Somewhat hard  Food Insecurity: Food Insecurity Present (01/20/2024)   Epic    Worried About Programme Researcher, Broadcasting/film/video in the Last Year: Sometimes true    Ran Out of Food in the Last Year: Sometimes true  Transportation Needs: No Transportation Needs (01/20/2024)   Epic    Lack of Transportation (Medical): No    Lack of Transportation (Non-Medical): No  Physical Activity: Sufficiently Active (01/20/2024)   Exercise Vital Sign    Days of Exercise per Week: 6 days    Minutes of Exercise per Session: 30 min  Stress: No Stress Concern Present (01/20/2024)   [employer] of Occupational Health - Occupational Stress Questionnaire    Feeling of Stress: Not at all  Social Connections: Moderately Isolated (01/20/2024)   Social Connection and Isolation Panel     Frequency of Communication with Friends and Family: More than three times a week    Frequency of Social Gatherings with Friends and Family: Once a week    Attends Religious Services: 1 to 4 times per year    Active Member of [employer] or Organizations: No    Attends Engineer, Structural: Not on file    Marital Status: Divorced  Intimate Partner Violence: Not At Risk (04/26/2022)   Humiliation, Afraid, Rape, and Kick questionnaire    Fear of Current or Ex-Partner: No    Emotionally Abused: No    Physically Abused: No    Sexually Abused: No  Depression (PHQ2-9): Low Risk (01/24/2024)   Depression (PHQ2-9)    PHQ-2 Score: 1  Alcohol Screen: Low Risk (01/20/2024)   Alcohol Screen    Last Alcohol Screening Score (AUDIT): 3  Housing: High Risk (01/20/2024)   Epic    Unable to Pay for Housing in the Last Year: Yes    Number of Times Moved in the Last Year: 0    Homeless in the Last Year: No  Utilities: Not At Risk (04/26/2022)   [facility] Utilities    Threatened with loss of utilities: No  Health Literacy: Not on file    FAMILY HISTORY: Family History  Problem Relation Age of Onset   Hypertension Mother    Diabetes Father    Prostate cancer Father    Healthy Sister    Healthy Brother    Cancer Maternal Grandmother    Stroke Maternal Grandmother    Diabetes Paternal Grandmother    Glaucoma Paternal Grandmother    Lung cancer Maternal Aunt    Pancreatic cancer Maternal Aunt    Lung cancer Paternal Aunt    Asthma Son    Breast cancer Neg Hx    Colon cancer Neg Hx    Ovarian cancer Neg Hx     ALLERGIES:  is allergic to flagyl  [metronidazole ].  MEDICATIONS:  Current Outpatient Medications  Medication Sig Dispense Refill   aspirin  EC 81 MG tablet Take 1 tablet (81 mg total) by mouth daily. Swallow whole. 30 tablet 12   Continuous Glucose Sensor (DEXCOM G7 SENSOR) MISC 1 Device by Does not apply route every 14 (fourteen) days. 6 each 1  fluticasone  (FLONASE ) 50 MCG/ACT  nasal spray SPRAY 2 SPRAYS INTO EACH NOSTRIL EVERY DAY 16 mL 3   insulin  glargine (LANTUS ) 100 UNIT/ML Solostar Pen Inject 20 Units into the skin daily. 15 mL 11   levocetirizine (XYZAL ) 5 MG tablet TAKE 1 TABLET BY MOUTH EVERY DAY IN THE EVENING 90 tablet 1   Multiple Vitamin (MULTIVITAMIN WITH MINERALS) TABS tablet Take 1 tablet by mouth daily.     olmesartan  (BENICAR ) 20 MG tablet TAKE 1 TABLET BY MOUTH EVERY DAY 90 tablet 1   Syringe/Needle, Disp, (SYRINGE 3CC/18GX1-1/2) 18G X 1-1/2 3 ML MISC 1 Application by Does not apply route 3 (three) times daily. 30 each 3   tamoxifen  (NOLVADEX ) 20 MG tablet Take 1 tablet (20 mg total) by mouth daily. 90 tablet 1   No current facility-administered medications for this visit.    Review of Systems  Constitutional:  Negative for appetite change, chills, fatigue and fever.  HENT:   Negative for hearing loss and voice change.   Eyes:  Negative for eye problems.  Respiratory:  Negative for chest tightness and cough.   Cardiovascular:  Negative for chest pain.  Gastrointestinal:  Negative for abdominal distention, abdominal pain and blood in stool.  Endocrine: Negative for hot flashes.  Genitourinary:  Negative for difficulty urinating and frequency.   Musculoskeletal:  Negative for arthralgias.  Skin:  Negative for itching and rash.  Neurological:  Negative for extremity weakness.  Hematological:  Negative for adenopathy.  Psychiatric/Behavioral:  Negative for confusion.      PHYSICAL EXAMINATION: ECOG PERFORMANCE STATUS: 0 - Asymptomatic  Vitals:   03/26/24 1000 03/26/24 1009  BP: (!) 168/82 (!) 158/68  Pulse: 64   Resp: 18   Temp: (!) 97 F (36.1 C)   SpO2: 100%    Filed Weights   03/26/24 1000  Weight: 253 lb (114.8 kg)    Physical Exam Constitutional:      General: She is not in acute distress.    Appearance: She is not diaphoretic.  HENT:     Head: Normocephalic and atraumatic.     Nose: Nose normal.     Mouth/Throat:      Pharynx: No oropharyngeal exudate.  Eyes:     General: No scleral icterus.    Pupils: Pupils are equal, round, and reactive to light.  Cardiovascular:     Rate and Rhythm: Normal rate and regular rhythm.     Heart sounds: No murmur heard. Pulmonary:     Effort: Pulmonary effort is normal. No respiratory distress.     Breath sounds: No rales.  Chest:     Chest wall: No tenderness.  Abdominal:     General: There is no distension.     Palpations: Abdomen is soft.     Tenderness: There is no abdominal tenderness.  Musculoskeletal:        General: Normal range of motion.     Cervical back: Normal range of motion and neck supple.  Skin:    General: Skin is warm and dry.     Findings: No erythema.  Neurological:     Mental Status: She is alert and oriented to person, place, and time.     Cranial Nerves: No cranial nerve deficit.     Motor: No abnormal muscle tone.     Coordination: Coordination normal.  Psychiatric:        Mood and Affect: Mood and affect normal.      LABORATORY DATA:  I have reviewed the  data as listed    Latest Ref Rng & Units 03/26/2024    9:42 AM 01/24/2024    8:55 AM 09/19/2023    9:38 AM  CBC  WBC 4.0 - 10.5 K/uL 4.1  3.9  4.5   Hemoglobin 12.0 - 15.0 g/dL 88.2  87.8  87.6   Hematocrit 36.0 - 46.0 % 33.5  37.0  36.2   Platelets 150 - 400 K/uL 277  293  277       Latest Ref Rng & Units 03/26/2024    9:42 AM 01/24/2024    8:55 AM 09/19/2023    9:38 AM  CMP  Glucose 70 - 99 mg/dL 838  843  854   BUN 6 - 20 mg/dL 11  13  13    Creatinine 0.44 - 1.00 mg/dL 9.26  9.17  9.29   Sodium 135 - 145 mmol/L 139  139  136   Potassium 3.5 - 5.1 mmol/L 4.3  4.5  4.0   Chloride 98 - 111 mmol/L 102  102  102   CO2 22 - 32 mmol/L 29  32  25   Calcium  8.9 - 10.3 mg/dL 89.9  9.3  9.3   Total Protein 6.5 - 8.1 g/dL 7.5  7.0  7.7   Total Bilirubin 0.0 - 1.2 mg/dL 0.3  0.4  0.6   Alkaline Phos 38 - 126 U/L 62   55   AST 15 - 41 U/L 16  14  14    ALT 0 - 44 U/L 14  12   16       RADIOGRAPHIC STUDIES: I have personally reviewed the radiological images as listed and agreed with the findings in the report. No results found. "

## 2024-03-26 NOTE — Assessment & Plan Note (Signed)
 Follow up with gynecology evaluation annually.

## 2024-07-16 ENCOUNTER — Ambulatory Visit: Admitting: Radiation Oncology

## 2024-07-24 ENCOUNTER — Encounter: Admitting: Internal Medicine

## 2024-09-24 ENCOUNTER — Inpatient Hospital Stay

## 2024-10-01 ENCOUNTER — Inpatient Hospital Stay: Admitting: Oncology
